# Patient Record
Sex: Female | Born: 1935 | Race: White | Hispanic: No | Marital: Married | Smoking: Never smoker
Health system: Southern US, Community
[De-identification: ages and names within clinical notes are randomized; demographics above are authoritative.]

## PROBLEM LIST (undated history)

## (undated) DIAGNOSIS — R55 Syncope and collapse: Secondary | ICD-10-CM

## (undated) DIAGNOSIS — I739 Peripheral vascular disease, unspecified: Secondary | ICD-10-CM

## (undated) DIAGNOSIS — C569 Malignant neoplasm of unspecified ovary: Secondary | ICD-10-CM

## (undated) DIAGNOSIS — R634 Abnormal weight loss: Secondary | ICD-10-CM

## (undated) DIAGNOSIS — R2681 Unsteadiness on feet: Secondary | ICD-10-CM

## (undated) DIAGNOSIS — R413 Other amnesia: Principal | ICD-10-CM

## (undated) DIAGNOSIS — N289 Disorder of kidney and ureter, unspecified: Secondary | ICD-10-CM

## (undated) DIAGNOSIS — F411 Generalized anxiety disorder: Secondary | ICD-10-CM

## (undated) DIAGNOSIS — S82891A Other fracture of right lower leg, initial encounter for closed fracture: Secondary | ICD-10-CM

## (undated) DIAGNOSIS — E785 Hyperlipidemia, unspecified: Secondary | ICD-10-CM

## (undated) DIAGNOSIS — I6529 Occlusion and stenosis of unspecified carotid artery: Secondary | ICD-10-CM

## (undated) DIAGNOSIS — I10 Essential (primary) hypertension: Secondary | ICD-10-CM

## (undated) DIAGNOSIS — R6889 Other general symptoms and signs: Secondary | ICD-10-CM

## (undated) DIAGNOSIS — F329 Major depressive disorder, single episode, unspecified: Secondary | ICD-10-CM

## (undated) DIAGNOSIS — I779 Disorder of arteries and arterioles, unspecified: Secondary | ICD-10-CM

## (undated) DIAGNOSIS — K5790 Diverticulosis of intestine, part unspecified, without perforation or abscess without bleeding: Secondary | ICD-10-CM

## (undated) DIAGNOSIS — G20A1 Parkinson's disease without dyskinesia, without mention of fluctuations: Secondary | ICD-10-CM

## (undated) DIAGNOSIS — G20 Parkinson's disease: Secondary | ICD-10-CM

## (undated) DIAGNOSIS — F0390 Unspecified dementia without behavioral disturbance: Secondary | ICD-10-CM

## (undated) DIAGNOSIS — E119 Type 2 diabetes mellitus without complications: Secondary | ICD-10-CM

## (undated) HISTORY — DX: Parkinson's disease: G20

## (undated) HISTORY — DX: Major depressive disorder, single episode, unspecified: F32.9

## (undated) HISTORY — PX: PROLAPSED UTERINE FIBROID LIGATION: SHX5400

## (undated) HISTORY — DX: Syncope and collapse: R55

## (undated) HISTORY — PX: APPENDECTOMY: SHX54

## (undated) HISTORY — DX: Other fracture of right lower leg, initial encounter for closed fracture: S82.891A

## (undated) HISTORY — DX: Occlusion and stenosis of unspecified carotid artery: I65.29

## (undated) HISTORY — DX: Essential (primary) hypertension: I10

## (undated) HISTORY — PX: CATARACT EXTRACTION: SUR2

## (undated) HISTORY — DX: Other general symptoms and signs: R68.89

## (undated) HISTORY — DX: Malignant neoplasm of unspecified ovary: C56.9

## (undated) HISTORY — PX: ABDOMINAL HYSTERECTOMY: SHX81

## (undated) HISTORY — DX: Diverticulosis of intestine, part unspecified, without perforation or abscess without bleeding: K57.90

## (undated) HISTORY — DX: Other amnesia: R41.3

## (undated) HISTORY — DX: Hyperlipidemia, unspecified: E78.5

## (undated) HISTORY — DX: Generalized anxiety disorder: F41.1

## (undated) HISTORY — DX: Abnormal weight loss: R63.4

## (undated) HISTORY — DX: Parkinson's disease without dyskinesia, without mention of fluctuations: G20.A1

## (undated) HISTORY — DX: Unsteadiness on feet: R26.81

## (undated) HISTORY — PX: ANKLE SURGERY: SHX546

## (undated) HISTORY — DX: Peripheral vascular disease, unspecified: I73.9

## (undated) HISTORY — DX: Disorder of arteries and arterioles, unspecified: I77.9

---

## 2003-07-24 ENCOUNTER — Encounter: Admission: RE | Admit: 2003-07-24 | Discharge: 2003-07-24 | Payer: Self-pay | Admitting: Internal Medicine

## 2003-07-24 ENCOUNTER — Encounter: Admission: RE | Admit: 2003-07-24 | Discharge: 2003-07-24 | Payer: Self-pay | Admitting: Obstetrics and Gynecology

## 2003-09-13 HISTORY — PX: VESICOVAGINAL FISTULA CLOSURE W/ TAH: SUR271

## 2003-10-10 ENCOUNTER — Inpatient Hospital Stay (HOSPITAL_COMMUNITY): Admission: RE | Admit: 2003-10-10 | Discharge: 2003-10-13 | Payer: Self-pay | Admitting: Obstetrics and Gynecology

## 2003-10-10 ENCOUNTER — Encounter (INDEPENDENT_AMBULATORY_CARE_PROVIDER_SITE_OTHER): Payer: Self-pay | Admitting: Specialist

## 2003-12-03 ENCOUNTER — Ambulatory Visit: Admission: RE | Admit: 2003-12-03 | Discharge: 2003-12-03 | Payer: Self-pay | Admitting: Gynecology

## 2003-12-18 ENCOUNTER — Ambulatory Visit (HOSPITAL_COMMUNITY): Admission: RE | Admit: 2003-12-18 | Discharge: 2003-12-18 | Payer: Self-pay | Admitting: Gynecology

## 2004-09-15 ENCOUNTER — Ambulatory Visit (HOSPITAL_COMMUNITY): Admission: RE | Admit: 2004-09-15 | Discharge: 2004-09-15 | Payer: Self-pay | Admitting: Obstetrics and Gynecology

## 2005-09-12 HISTORY — PX: OOPHORECTOMY: SHX86

## 2005-11-23 ENCOUNTER — Ambulatory Visit (HOSPITAL_COMMUNITY): Admission: RE | Admit: 2005-11-23 | Discharge: 2005-11-23 | Payer: Self-pay | Admitting: Obstetrics and Gynecology

## 2007-07-05 ENCOUNTER — Ambulatory Visit (HOSPITAL_COMMUNITY): Admission: RE | Admit: 2007-07-05 | Discharge: 2007-07-05 | Payer: Self-pay | Admitting: Obstetrics and Gynecology

## 2008-07-08 ENCOUNTER — Ambulatory Visit (HOSPITAL_COMMUNITY): Admission: RE | Admit: 2008-07-08 | Discharge: 2008-07-08 | Payer: Self-pay | Admitting: Obstetrics and Gynecology

## 2008-07-11 ENCOUNTER — Encounter: Admission: RE | Admit: 2008-07-11 | Discharge: 2008-07-11 | Payer: Self-pay | Admitting: Obstetrics and Gynecology

## 2009-07-13 ENCOUNTER — Ambulatory Visit (HOSPITAL_COMMUNITY): Admission: RE | Admit: 2009-07-13 | Discharge: 2009-07-13 | Payer: Self-pay | Admitting: Obstetrics and Gynecology

## 2009-12-14 ENCOUNTER — Ambulatory Visit (HOSPITAL_COMMUNITY): Admission: RE | Admit: 2009-12-14 | Discharge: 2009-12-14 | Payer: Self-pay | Admitting: Internal Medicine

## 2010-02-25 ENCOUNTER — Encounter: Admission: RE | Admit: 2010-02-25 | Discharge: 2010-02-25 | Payer: Self-pay | Admitting: Neurology

## 2010-07-14 ENCOUNTER — Ambulatory Visit (HOSPITAL_COMMUNITY): Admission: RE | Admit: 2010-07-14 | Discharge: 2010-07-14 | Payer: Self-pay | Admitting: Internal Medicine

## 2010-10-02 ENCOUNTER — Encounter: Payer: Self-pay | Admitting: Obstetrics and Gynecology

## 2010-10-03 ENCOUNTER — Encounter: Payer: Self-pay | Admitting: Obstetrics and Gynecology

## 2010-10-03 ENCOUNTER — Encounter: Payer: Self-pay | Admitting: Gynecology

## 2010-10-03 ENCOUNTER — Encounter: Payer: Self-pay | Admitting: Neurology

## 2011-01-28 NOTE — H&P (Signed)
NAME:  Alicia [name], Alicia [name]                     ACCOUNT NO.:  [id number]   MEDICAL RECORD NO.:  [id number]                   PATIENT TYPE:  INP   LOCATION:  NA                                   FACILITY:  [facility]   PHYSICIAN:  Katherine Roan, M.D.               DATE OF BIRTH:  [birth year]   DATE OF ADMISSION:  DATE OF DISCHARGE:                                HISTORY & PHYSICAL   CHIEF COMPLAINT:  Complete prolapse.   HISTORY OF PRESENT ILLNESS:  This patient is a 77 year old female with  complete uterine prolapse to the point that about 4 cm protrudes outside the  vagina. She has some pelvic pressure and desires this to be corrected.   Alicia medications include Lipitor 10 mg. She is on Diovan/HCTZ 160 mg and  folic acid. She currently takes Augmentin for a sinus infection that she was  treated for about a week ago. She states she feels good. She has a history  of broken ankle, history of D&C, and she has had one child. She has also had  a tonsillectomy.  He has been seen by the urologist and Alicia urethra appears  to have a low pressure urethra profile, so we plan to do a sling procedure  in addition to the pelvic repair.   REVIEW OF SYSTEMS:  HEENT: She wears glasses, but no headaches or dizziness.  CARDIAC: She has been followed for hypertension. Denies chest pain or  shortness of breath. This hypertension is very well controlled. LUNGS: No  chronic cough, no asthma. GU: She has minimal stress incontinence. No  history of UTIs. GI:  No bowel habit change. No melena. A recent colonoscopy  was normal. MUSCLE, BONES, & JOINTS: She fractured Alicia right ankle without  sequela.   FAMILY HISTORY:  Both parents are deceased. [name] died at 99 and Alicia father  died at 79. She has two sisters who are living and well. Alicia [name] did have  breast cancer.   PHYSICAL EXAMINATION:  GENERAL: Examination reveals a well-developed and  nourished female who appears to be Alicia stated age of 89. She is  oriented to  time, place, and recent events.  HEENT: Unremarkable. The oropharynx is not injected. There is clear nasal  discharge, but no postnasal drip. Ears are not infected. The oropharynx is  not injected.  NECK: Supple. Carotid pulses are equal without bruits. Trachea is in the  midline. No adenopathy appreciated.  BREASTS: No masses.  HEART: Normal sinus rhythm. No murmurs.  LUNGS: Clear to P&A.  ABDOMEN: Soft. Liver, spleen, or kidneys are not palpated. The liver is at  the right costal margin. No masses are felt and no tenderness.  EXTREMITIES: Trace edema. Equal pulses and reflexes.  PELVIC EXAM: Complex prolapse of the cervix. The uterus appears to be normal  size for Alicia age without masses felt in the adnexa. Rectovaginal confirms.  There is very little rectocele present.  IMPRESSION:  Complete prolapse.   PLAN:  Pelvic repair in conjunction with urologic surgery by Dr. Ihor Gully. I have discussed with Alicia [name] and Alicia [name] the risks of the  surgical procedure, particularly the risks of Alicia age, risks of hemorrhage,  infection, damage to bowel and bladder. Also urinary retention and failure  rates have been discussed with the patient.   FINAL DIAGNOSIS:  Complete prolapse for pelvic reconstruction.                                               Katherine Roan, M.D.    SDM/MEDQ  D:  10/07/2003  T:  10/07/2003  Job:  [id number]

## 2011-01-28 NOTE — Op Note (Signed)
NAME:  TALULLAH, ABATE                     ACCOUNT NO.:  [id number]   MEDICAL RECORD NO.:  [id number]                   PATIENT TYPE:  INP   LOCATION:  X001                                 FACILITY:  [facility]   PHYSICIAN:  Mark C. Vernie Ammons, M.D.               DATE OF BIRTH:  07/06/1923   DATE OF PROCEDURE:  DATE OF DISCHARGE:                                 OPERATIVE REPORT   PREOPERATIVE DIAGNOSIS:  Stress urinary incontinence.   POSTOPERATIVE DIAGNOSIS:  Stress urinary incontinence.   PROCEDURE:  Transobturator sling.   SURGEON:  Mark C. Vernie Ammons, M.D.   ANESTHESIA:  General.   DRAINS:  16 French Foley catheter.   ESTIMATED BLOOD LOSS:  Less than 5 mL.   SPECIMENS:  None.   COMPLICATIONS:  None.   INDICATIONS FOR PROCEDURE:  The patient is a 77 year old white female with  complete vaginal prolapse.  She has significant stress urinary incontinence  and was evaluated urodynamically and found to have a stable bladder but  diminished external urinary sphincter function.  I have discussed surgical  correction of this with her in conjunction with her planned procedure by Dr.  Elana Alm and she understands and would like to proceed with that.   DESCRIPTION OF PROCEDURE:  After informed consent, the patient was brought  to the major OR, placed on the table, administered general anesthesia and  then moved to the dorsal lithotomy position.  Her genitalia and vagina were  sterilely prepped and draped and Dr. Elana Alm performed her hysterectomy and  prepared the anterior vaginal wall for anterior repair.   With a 16 French Foley catheter in place, I was able to palpate the urethra  in the bladder neck region.  I therefore chose a location at the medial  aspect of the obturator fossa at the level of the clitoris and made separate  stab incisions on each side.  I made sure the bladder was empty and passed  the trocar through the skin and through the obturator fascia and obturator  foramen behind the symphysis pubis and out at the level of the mid urethra.  The tape was then placed in the obturator trocar and withdrawn to the skin  level. This was performed on first the left then right sides.  I then  removed the catheter and performed rigid cystoscopy and noted the bladder to  be fully intact with no tumor, stones or inflammatory lesions.  The  cystocele was noted.  No perforation or injury occurred from the passage of  the trocars.  I therefore replaced the Foley catheter and then adjusted the  tension on the sling so it was tension free but in good position in the mid  urethral region. I then cut off excess tape at the skin level and closed the  skin incisions with Dermabond.  Dr. Elana Alm then performed his anterior  repair and closed the anterior vaginal mucosa. His portion of the operation  will be dictated separately by him.  The patient tolerated this portion of  the  procedure well without any complications.                                               Mark C. Vernie Ammons, M.D.    MCO/MEDQ  D:  10/10/2003  T:  10/10/2003  Job:  [id number]   cc:   S. Kyra Manges, M.D.  [phone] [address]  [place]  [place] [address]  Fax: [phone]

## 2011-01-28 NOTE — Op Note (Signed)
NAME:  Alicia [name], [name]                     ACCOUNT NO.:  [id number]   MEDICAL RECORD NO.:  [id number]                   PATIENT TYPE:  INP   LOCATION:  X001                                 FACILITY:  [facility]   PHYSICIAN:  Katherine Roan, M.D.               DATE OF BIRTH:  [birth year]   DATE OF PROCEDURE:  10/10/2003  DATE OF DISCHARGE:                                 OPERATIVE REPORT   PREOPERATIVE DIAGNOSIS:  Complete genital prolapse.   POSTOPERATIVE DIAGNOSIS:  Complete genital prolapse.   OPERATION PERFORMED:  Pelvic examination under anesthesia, __________  enterocele repair and anterior repair, obturator sling by Veverly Fells. Vernie Ammons,  M.D.   The patient was positioned in the lithotomy position with Allen stirrups,  intubation and general endotracheal anesthesia having been administered by  Hezzie Bump. Okey Dupre, M.D.  The patient was examined, the uterus was completely  prolapsed and no pelvic masses were noted.   Prepped and draped in the usual fashion and the cervix grasped with a  tenaculum. The cul-de-sac was entered posteriorly as well as anteriorly and  I then delivered the uterus into the anterior incision and removed the  uterus from the uteroovarian anastomosis all the way down to the cardinal  ligaments and uterosacral ligaments.  We then did a wedge of the enterocele  sac and closed this with interrupted sutures of 3-0 Ethibond and #0 Vicryl.  At that point, we were getting ready to close the peritoneal cavity and we  could not find the sponge. We finally found it in the right quadrant in the  pelvis.  The pelvic peritoneum was then closed with a pursestring suture of  2-0 PDS and following this, we dissected the vagina from the underlying  pubocervical vaginal fascia.  Dr. Vernie Ammons came in and did a transobturator  incision along with cystoscopy.  The anterior repair was then completed by  plicating the pubocervical vaginal fascia in the midline and then the vagina  was  closed with interrupted sutures of 3-0 Vicryl and the continuation of  the 2-0 PDS with the peritoneal suture.  I decided not to do a posterior  repair.  The Foley was draining clear urine, she tolerated this procedure  well and was sent to the recovery room in good condition.                                               Katherine Roan, M.D.    SDM/MEDQ  D:  10/10/2003  T:  10/10/2003  Job:  [id number]   cc:   Veverly Fells. Vernie Ammons, M.D.  [address]  [place]  [place] [address]  Fax: [phone]

## 2011-01-28 NOTE — Discharge Summary (Signed)
NAME:  Alicia [name], Alicia [name]                     ACCOUNT NO.:  [id number]   MEDICAL RECORD NO.:  [id number]                   PATIENT TYPE:  INP   LOCATION:  0455                                 FACILITY:  [facility]   PHYSICIAN:  Katherine Roan, M.D.               DATE OF BIRTH:  11/13/1922   DATE OF ADMISSION:  10/10/2003  DATE OF DISCHARGE:  10/13/2003                                 DISCHARGE SUMMARY   ADMISSION DIAGNOSIS:  Complete genital prolapse with stress incontinence,  enterocele, and cystocele.   DISCHARGE DIAGNOSES:  1. Complete genital prolapse with stress incontinence, enterocele, and     cystocele.  2. Hypertension.  3. Endometrial carcinoma, grade 1.   BRIEF HISTORY:  [name] is a 77 year old female who has presented with  complete prolapse and pelvic discomfort, and desired this to be repaired.  Admission metabolic profile was normal. Hemoglobin was 12 and postoperative  hemoglobin was 11.2 with a white count of 11,000. Chest x-ray and cardiogram  were normal.   HOSPITAL COURSE:  The patient was admitted to the hospital and underwent a  pelvic examination under anesthesia. A vaginal hysterectomy, enterocele  repair, and anterior repair. At the time of the anterior repair a sling  procedure was done by Dr. Vernie Ammons. Her postoperative course was  uncomplicated. She was unable to void, so we discharged her on the 30th with  a Foley in place to see Dr. Vernie Ammons for catheter removal.   The final pathology report revealed an endometrial carcinoma that was grade  1 and limited to the lining of the uterus. She will be presented to oncology  at [facility] for consideration of further therapy. My opinion is probably  nothing else needs to be done.   CONDITION ON DISCHARGE:  Improved.                                               Katherine Roan, M.D.    SDM/MEDQ  D:  10/24/2003  T:  10/24/2003  Job:  [id number]   cc:   Loraine Leriche C. Vernie Ammons, M.D.  [address]  [place]  [place] [address]  Fax: [phone]

## 2011-01-28 NOTE — Consult Note (Signed)
NAME:  Alicia [name], Alicia [name]                     ACCOUNT NO.:  [id number]   MEDICAL RECORD NO.:  [id number]                   PATIENT TYPE:  OUT   LOCATION:  GYN                                  FACILITY:  [facility]   PHYSICIAN:  De Blanch, M.D.         DATE OF BIRTH:  Oct 30, 1922   DATE OF CONSULTATION:  12/03/2003  DATE OF DISCHARGE:                                   CONSULTATION   REASON FOR CONSULTATION:  A 77 year old white female seen in consultation at  the request of Dr. Kyra Manges.  The patient underwent vaginal hysterectomy  and colpocleisis for management of procidentia and pelvic floor prolapse.  Final pathology surprisingly showed an endometrioid adenocarcinoma grade 1  with myometrial invasion of 3 mm into a 5 mm thick myometrium (stage IC,  grade 1).  The patient has had an uncomplicated postoperative course and  presents for consultation regarding discussion of possible management  options.   PAST MEDICAL HISTORY:   MEDICAL ILLNESSES:  1. Hypertension.  2. Elevated cholesterol.   PAST SURGICAL HISTORY:  1. Ankle reconstruction.  2. Vaginal hysterectomy.  3. D&C.   OBSTETRICAL HISTORY:  Gravida 1.   DRUG ALLERGIES:  SULFA.   FAMILY HISTORY:  Negative for gynecologic or colon cancer.  The patient's  mother had breast cancer.   CURRENT MEDICATIONS:  Diovan, lisinopril, Lipitor.   REVIEW OF SYSTEMS:  Negative except as noted above.   PHYSICAL EXAMINATION:  VITAL SIGNS:  Weight 126 pounds.  The rest of the  exam is deferred at this time.   IMPRESSION:  Stage 1C, grade 1 endometrial cancer status post vaginal  hysterectomy.   I had a lengthy discussion with the patient and her daughter-in-law  regarding management options.  At this juncture, I have no evidence that  additional radiation therapy would improve her overall survival although it  might decrease local recurrences.  The risks and benefits of radiation  therapy were then weighed.   At the  end of the conversation, the patient and her daughter-in-law are in  agreement that they do not wish to have any additional radiation therapy.  We have agreed we will go ahead and obtain a baseline CT scan of the chest,  abdomen, and pelvis.   The pros and cons of removing the ovaries were also discussed.  I think the  risk of ovarian involvement is small and the risks of laparoscopically  removing the ovaries are not inconsequential.  We will contact the patient  once the CT scan report is back and we will suggest that she follow up with  Dr. Elana Alm in the future.                                               De Blanch, M.D.    DC/MEDQ  D:  12/03/2003  T:  12/04/2003  Job:  [id number]   cc:   S. Kyra Manges, M.D.  [phone] [address]  [place]  [place] [address]  Fax: [phone]   Telford Nab, R.N.  [address]  [place], [place] [address]

## 2011-06-16 ENCOUNTER — Other Ambulatory Visit (HOSPITAL_COMMUNITY): Payer: Self-pay | Admitting: Physician Assistant

## 2011-06-16 DIAGNOSIS — M810 Age-related osteoporosis without current pathological fracture: Secondary | ICD-10-CM

## 2011-06-16 DIAGNOSIS — Z1231 Encounter for screening mammogram for malignant neoplasm of breast: Secondary | ICD-10-CM

## 2011-07-20 ENCOUNTER — Other Ambulatory Visit (HOSPITAL_COMMUNITY): Payer: Self-pay

## 2011-07-20 ENCOUNTER — Ambulatory Visit (HOSPITAL_COMMUNITY): Payer: Self-pay

## 2011-08-17 ENCOUNTER — Ambulatory Visit (HOSPITAL_COMMUNITY)
Admission: RE | Admit: 2011-08-17 | Discharge: 2011-08-17 | Disposition: A | Discharge status: Home / Self Care | Payer: Medicare Other | Source: Ambulatory Visit | Attending: Physician Assistant | Admitting: Physician Assistant

## 2011-08-17 DIAGNOSIS — Z1231 Encounter for screening mammogram for malignant neoplasm of breast: Secondary | ICD-10-CM | POA: Insufficient documentation

## 2011-08-22 ENCOUNTER — Other Ambulatory Visit: Payer: Self-pay | Admitting: Physician Assistant

## 2011-08-22 DIAGNOSIS — R928 Other abnormal and inconclusive findings on diagnostic imaging of breast: Secondary | ICD-10-CM

## 2011-09-01 ENCOUNTER — Ambulatory Visit
Admission: RE | Admit: 2011-09-01 | Discharge: 2011-09-01 | Disposition: A | Discharge status: Home / Self Care | Payer: Medicare Other | Source: Ambulatory Visit | Attending: Physician Assistant | Admitting: Physician Assistant

## 2011-09-01 DIAGNOSIS — R928 Other abnormal and inconclusive findings on diagnostic imaging of breast: Secondary | ICD-10-CM

## 2012-02-14 ENCOUNTER — Other Ambulatory Visit (HOSPITAL_COMMUNITY): Payer: Self-pay | Admitting: Physician Assistant

## 2012-02-14 DIAGNOSIS — M810 Age-related osteoporosis without current pathological fracture: Secondary | ICD-10-CM

## 2012-02-23 ENCOUNTER — Ambulatory Visit (HOSPITAL_COMMUNITY)
Admission: RE | Admit: 2012-02-23 | Discharge: 2012-02-23 | Disposition: A | Discharge status: Home / Self Care | Payer: Medicare Other | Source: Ambulatory Visit | Attending: Physician Assistant | Admitting: Physician Assistant

## 2012-02-27 ENCOUNTER — Other Ambulatory Visit (HOSPITAL_COMMUNITY): Payer: Self-pay | Admitting: Physician Assistant

## 2012-02-27 DIAGNOSIS — Z1231 Encounter for screening mammogram for malignant neoplasm of breast: Secondary | ICD-10-CM

## 2012-05-20 ENCOUNTER — Encounter (HOSPITAL_COMMUNITY): Payer: Self-pay

## 2012-05-20 ENCOUNTER — Emergency Department (HOSPITAL_COMMUNITY)
Admission: EM | Admit: 2012-05-20 | Discharge: 2012-05-20 | Disposition: A | Discharge status: Home / Self Care | Payer: Medicare Other | Attending: Emergency Medicine | Admitting: Emergency Medicine

## 2012-05-20 ENCOUNTER — Emergency Department (HOSPITAL_COMMUNITY): Payer: Medicare Other

## 2012-05-20 DIAGNOSIS — M542 Cervicalgia: Secondary | ICD-10-CM | POA: Insufficient documentation

## 2012-05-20 DIAGNOSIS — S0181XA Laceration without foreign body of other part of head, initial encounter: Secondary | ICD-10-CM

## 2012-05-20 DIAGNOSIS — W19XXXA Unspecified fall, initial encounter: Secondary | ICD-10-CM

## 2012-05-20 DIAGNOSIS — W101XXA Fall (on)(from) sidewalk curb, initial encounter: Secondary | ICD-10-CM | POA: Insufficient documentation

## 2012-05-20 DIAGNOSIS — I10 Essential (primary) hypertension: Secondary | ICD-10-CM | POA: Insufficient documentation

## 2012-05-20 DIAGNOSIS — T07XXXA Unspecified multiple injuries, initial encounter: Secondary | ICD-10-CM

## 2012-05-20 DIAGNOSIS — R51 Headache: Secondary | ICD-10-CM | POA: Insufficient documentation

## 2012-05-20 DIAGNOSIS — IMO0002 Reserved for concepts with insufficient information to code with codable children: Secondary | ICD-10-CM | POA: Insufficient documentation

## 2012-05-20 DIAGNOSIS — S0180XA Unspecified open wound of other part of head, initial encounter: Secondary | ICD-10-CM | POA: Insufficient documentation

## 2012-05-20 HISTORY — DX: Unspecified dementia, unspecified severity, without behavioral disturbance, psychotic disturbance, mood disturbance, and anxiety: F03.90

## 2012-05-20 HISTORY — DX: Disorder of kidney and ureter, unspecified: N28.9

## 2012-05-20 NOTE — ED Notes (Signed)
Pt fell in parking lot today, head hit pavement, with lac to forehead and abrasion on knees.  No Loss of consciousness

## 2012-05-20 NOTE — ED Provider Notes (Signed)
Medical screening examination/treatment/procedure(s) were conducted as a shared visit with non-physician practitioner(s) and myself.  I personally evaluated the patient during the encounter  Left forehead lac, denies focal neuro Sxs, superficial abrasions to knees, tetanus shot UTD  Hurman Horn, MD 05/24/12 1656

## 2012-05-20 NOTE — ED Notes (Signed)
Daughter in law at bedside, pt lives with her.

## 2012-05-20 NOTE — ED Notes (Signed)
Daughter in law Reginae Wolfrey [phone].

## 2012-05-20 NOTE — ED Provider Notes (Signed)
History     CSN: [id number]  Arrival date & time 05/20/12  1350   First MD Initiated Contact with Patient 05/20/12 1436      Chief Complaint  Patient presents with  . Fall    (Consider location/radiation/quality/duration/timing/severity/associated sxs/prior treatment) Patient is a 78 y.o. female presenting with fall. The history is provided by the patient.  Fall The accident occurred 1 to 2 hours ago. Pertinent negatives include no abdominal pain and no nausea. Associated symptoms comments: She lost her balance when stepping off a curb, throwing her forward into a car. She has a laceration over left eye at point of impact. She also fell onto her knees and complains of bilateral knee abrasions. No LOC, no nausea or vomiting. Per family member at bedside, no change in mentation. She denies chest pain, abdominal pain or neck pain..    Past Medical History  Diagnosis Date  . Dementia   . Renal disease   . Hypertension     History reviewed. No pertinent past surgical history.  History reviewed. No pertinent family history.  History  Substance Use Topics  . Smoking status: Never Smoker   . Smokeless tobacco: Not on file  . Alcohol Use: No    OB History    Grav Para Term Preterm Abortions TAB SAB Ect Mult Living                  Review of Systems  HENT: Negative for neck pain.   Eyes: Negative for visual disturbance.  Respiratory: Negative for shortness of breath.   Cardiovascular: Negative for chest pain.  Gastrointestinal: Negative for nausea and abdominal pain.  Genitourinary: Negative for pelvic pain.  Skin: Positive for wound.  Neurological: Negative for dizziness.    Allergies  Sulfa antibiotics  Home Medications   Current Outpatient Rx  Name Route Sig Dispense Refill  . AMLODIPINE BESYLATE 10 MG PO TABS Oral Take 10 mg by mouth daily.    . ASPIRIN EC 81 MG PO TBEC Oral Take 81 mg by mouth daily.    . ATORVASTATIN CALCIUM 20 MG PO TABS Oral Take 20 mg by  mouth daily.    . DONEPEZIL HCL 5 MG PO TABS Oral Take 5 mg by mouth at bedtime.    Marland Kitchen LISINOPRIL 20 MG PO TABS Oral Take 20 mg by mouth 2 (two) times daily.    Marland Kitchen METOPROLOL SUCCINATE ER 50 MG PO TB24 Oral Take 50 mg by mouth daily. Take with or immediately following a meal.    . ADULT MULTIVITAMIN W/MINERALS CH Oral Take 1 tablet by mouth daily.    . OXYBUTYNIN CHLORIDE ER 10 MG PO TB24 Oral Take 10 mg by mouth daily.    . SERTRALINE HCL 50 MG PO TABS Oral Take 50 mg by mouth daily.      BP 114/48  Pulse 58  Temp 98.4 F (36.9 C) (Oral)  SpO2 100%  Physical Exam  Constitutional: She is oriented to person, place, and time. She appears well-developed and well-nourished. No distress.  HENT:  Head: Normocephalic.       Linear, vertical laceration left forehead. Minimal swelling.   Eyes: Conjunctivae are normal. Pupils are equal, round, and reactive to light.  Neck: Normal range of motion.  Cardiovascular: Normal rate and regular rhythm.   No murmur heard. Pulmonary/Chest: Effort normal. She has no wheezes. She has no rales. She exhibits no tenderness.  Abdominal: Soft. There is no tenderness. There is no rebound and no  guarding.  Musculoskeletal: Normal range of motion. She exhibits no edema.       Knees bilaterally have anterior abrasions without significant swelling. FROM without discomfort. No midline cervical, lumbar or thoracic tenderness.   Neurological: She is alert and oriented to person, place, and time. She displays normal reflexes. Coordination normal.  Skin: Skin is warm and dry.  Psychiatric: She has a normal mood and affect.    ED Course  Procedures (including critical care time)  Labs Reviewed - No data to display No results found. LACERATION REPAIR Performed by: Langley Adie A Authorized by: Langley Adie A Consent: Verbal consent obtained. Risks and benefits: risks, benefits and alternatives were discussed Consent given by: patient Patient identity  confirmed: provided demographic data Prepped and Draped in normal sterile fashion Wound explored  Laceration Location: left forehead  Laceration Length: 2cm  No Foreign Bodies seen or palpated  Anesthesia: local infiltration  Local anesthetic: lidocaine 1% w/o epinephrine  Anesthetic total: 1 ml  Irrigation method: syringe Amount of cleaning: standard  Skin closure: 6-0 prolene  Number of sutures: 4  Technique: simple interrupted  Patient tolerance: Patient tolerated the procedure well with no immediate complications.   No diagnosis found.  1. Fall 2. Forehead laceration 3. Multiple abrasions.  MDM  Patient remains alert and coherent. Laceration repaired, then patient ambulated and was found to be without ataxia, dizziness or pain. Fall was mechanical fall, and she appears stable for discharge.        Rodena Medin, PA-C 05/20/12 1636

## 2012-05-20 NOTE — ED Notes (Signed)
Pts daughter in law, Fannie Knee, called.  She can not make it to the hospital until 1830 to pick up patient.

## 2012-05-24 NOTE — ED Provider Notes (Signed)
Medical screening examination/treatment/procedure(s) were conducted as a shared visit with non-physician practitioner(s) and myself.  I personally evaluated the patient during the encounter  Hurman Horn, MD 05/24/12 [phone]

## 2012-08-20 ENCOUNTER — Ambulatory Visit (HOSPITAL_COMMUNITY)
Admission: RE | Admit: 2012-08-20 | Discharge: 2012-08-20 | Disposition: A | Discharge status: Home / Self Care | Payer: Medicare Other | Source: Ambulatory Visit | Attending: Physician Assistant | Admitting: Physician Assistant

## 2012-08-20 DIAGNOSIS — Z780 Asymptomatic menopausal state: Secondary | ICD-10-CM | POA: Insufficient documentation

## 2012-08-20 DIAGNOSIS — Z1231 Encounter for screening mammogram for malignant neoplasm of breast: Secondary | ICD-10-CM

## 2012-08-20 DIAGNOSIS — M810 Age-related osteoporosis without current pathological fracture: Secondary | ICD-10-CM

## 2012-08-20 DIAGNOSIS — Z13820 Encounter for screening for osteoporosis: Secondary | ICD-10-CM | POA: Insufficient documentation

## 2012-12-10 ENCOUNTER — Ambulatory Visit (INDEPENDENT_AMBULATORY_CARE_PROVIDER_SITE_OTHER): Payer: Medicare Other | Admitting: Neurology

## 2012-12-10 ENCOUNTER — Encounter: Payer: Self-pay | Admitting: Neurology

## 2012-12-10 VITALS — BP 108/59 | HR 57 | Temp 96.9°F | Ht 61.5 in | Wt 119.0 lb

## 2012-12-10 DIAGNOSIS — F32A Depression, unspecified: Secondary | ICD-10-CM

## 2012-12-10 DIAGNOSIS — F411 Generalized anxiety disorder: Secondary | ICD-10-CM

## 2012-12-10 DIAGNOSIS — R634 Abnormal weight loss: Secondary | ICD-10-CM

## 2012-12-10 DIAGNOSIS — I10 Essential (primary) hypertension: Secondary | ICD-10-CM

## 2012-12-10 DIAGNOSIS — E785 Hyperlipidemia, unspecified: Secondary | ICD-10-CM | POA: Insufficient documentation

## 2012-12-10 DIAGNOSIS — R413 Other amnesia: Secondary | ICD-10-CM

## 2012-12-10 DIAGNOSIS — IMO0001 Reserved for inherently not codable concepts without codable children: Secondary | ICD-10-CM

## 2012-12-10 DIAGNOSIS — R6889 Other general symptoms and signs: Secondary | ICD-10-CM

## 2012-12-10 DIAGNOSIS — F329 Major depressive disorder, single episode, unspecified: Secondary | ICD-10-CM | POA: Insufficient documentation

## 2012-12-10 DIAGNOSIS — R55 Syncope and collapse: Secondary | ICD-10-CM | POA: Insufficient documentation

## 2012-12-10 DIAGNOSIS — R569 Unspecified convulsions: Secondary | ICD-10-CM

## 2012-12-10 HISTORY — DX: Reserved for inherently not codable concepts without codable children: IMO0001

## 2012-12-10 HISTORY — DX: Essential (primary) hypertension: I10

## 2012-12-10 HISTORY — DX: Hyperlipidemia, unspecified: E78.5

## 2012-12-10 HISTORY — DX: Generalized anxiety disorder: F41.1

## 2012-12-10 HISTORY — DX: Other amnesia: R41.3

## 2012-12-10 HISTORY — DX: Abnormal weight loss: R63.4

## 2012-12-10 HISTORY — DX: Depression, unspecified: F32.A

## 2012-12-10 HISTORY — DX: Syncope and collapse: R55

## 2012-12-10 MED ORDER — DONEPEZIL HCL 5 MG PO TABS: 5.0000 mg | ORAL_TABLET | Freq: Every day | ORAL | Status: DC

## 2012-12-10 NOTE — Progress Notes (Signed)
Subjective:    Patient ID: Alicia [name] is a 79 y.o. female.  HPI Interim history:  Alicia [name] is a pleasant 79 year old right-handed woman who presents for followup consultation of her memory loss and periods of confusion. She is accompanied by her daughter today and this is her first visit with me. She is a former patient of Dr. Imagene Gurney and was followed by him for several years. She has an underlying history of hypo-pressure, cancer, depression and anxiety, hyperlipidemia, syncope and weight loss. She was last seen by him recently on 10/12/2012 at which time he did not increase the donezepil because of her weight loss. She was asked to go back to see her primary care physician for evaluation of her weight loss. I reviewed Dr. Imagene Gurney prior notes and the patient records and below is a summary of that review:  79 year old right-handed white widowed female, who has been living with her son and daughter-in-law for the past 10+ years. The last 6 years she has had right hand and arm tremor greater than left hand and arm tremor which is getting worse. It affects her eating, holding a glass of fluid, and handwriting but only to a minor extent. She has no family history of tremor. She does not drink alcohol. [name] noted a change in her memory. She made mistakes in bill paying, told  the same story over and over, had difficulty preparing meals and could not remember if she had fed the grandchildren. She got lost driving to a swimming pool with the grandchildren 4-1/2 years ago. They had to instruct her how to get back home. She no longer drives a car. She has no family history of memory loss. She denies headaches, single eye visual loss, double vision, or swallowing problems. On 02/11/2010 MMSE 27/30, CDT 4/4,  and AFT 8 . She does not shop,cook, or clean, or excercise. She has bladder urgency and  incontinence. MRI of the brain from 02/25/2010 showed chronic microvascular ischemia and  generalized atrophy. RPR and B12 were normal on 02/14/2010. She was started on Aricept 5 mg daily but discontinued the medication and also discontinued Zoloft. [name] restarted donepezil 5 mg. She had a pre[place]syncopal spell [place] and when  EMT service arrived her blood pressure was 60/40 and she was taken to [facility] emergency room. She has a prior history many years ago of a similar event. The patient had no associated urinary or bowel incontinence, clonic activity, or chest pain.  She had no post episode confusion. There were no focal neurologic symptoms or signs except drawing  up her right leg. On 07/27/2011 = MMSE 25/30, CDT 4/4, AFT 8. GDS 0/15, Falls assessment tool scores 16. Donezepil was kept at 5 mg daily, due to concern that she may have bradycardia. [name] indicates that the patient had carotid Dopplers that were normal 2012. The patient fell in 2013 and struck her head. She was losing weight. On 10/12/2012 = MMSE 22/30, CDT 4/4, AFT 9, GDS 1/15. Falls assessment tool score 19.   She had recent C. Doppler studies. She was admitted to [facility] on 3/18 and was kept 2 nights. She was given IVF and had a CXR and a CTH and brain MRI. She had some labs done and an EKG. She had no abnormal tests, as far as Alicia [name] knows. I do not have any records available as yet. She denies any recent chest pain, shortness of breath, and there is  no report of twitching or jerking episodes. When she was admitted to [facility] she had an episode of unresponsiveness with eyes rolled back. No tongue bite and no urinary incontinence was reported by daughter-in-law.  Her Past Medical History Is Significant For: Past Medical History  Diagnosis Date  . Dementia   . Renal disease   . Hypertension   . Memory loss 12/10/2012  . Depression 12/10/2012  . Anxiety state, unspecified 12/10/2012  . Syncope 12/10/2012  . Spells 12/10/2012  . Essential  hypertension, benign 12/10/2012  . Other and unspecified hyperlipidemia 12/10/2012  . Loss of weight 12/10/2012    Her Past Surgical History Is Significant For: History reviewed. No pertinent past surgical history.  Her Family History Is Significant For: No family history on file.  Her Social History Is Significant For: History   Social History  . Marital Status: Married    Spouse Name: N/A    Number of Children: N/A  . Years of Education: N/A   Social History Main Topics  . Smoking status: Never Smoker   . Smokeless tobacco: None  . Alcohol Use: No  . Drug Use: No  . Sexually Active: No   Other Topics Concern  . None   Social History Narrative  . None    Her Allergies Are:  Allergies  Allergen Reactions  . Sulfa Antibiotics Hives and Itching  :   Her Current Medications Are:  Outpatient Encounter Prescriptions as of 12/10/2012  Medication Sig Dispense Refill  . amLODipine (NORVASC) 10 MG tablet Take 10 mg by mouth daily.      Marland Kitchen aspirin EC 81 MG tablet Take 81 mg by mouth daily.      Marland Kitchen atorvastatin (LIPITOR) 20 MG tablet Take 20 mg by mouth daily.      . calcium carbonate 200 MG capsule Take 600 mg by mouth daily. Take at night      . donepezil (ARICEPT) 5 MG tablet Take 1 tablet (5 mg total) by mouth at bedtime.  90 tablet  3  . lisinopril (PRINIVIL,ZESTRIL) 20 MG tablet Take 20 mg by mouth 2 (two) times daily.      . metoprolol succinate (TOPROL-XL) 50 MG 24 hr tablet Take 50 mg by mouth daily. Take with or immediately following a meal.      . Multiple Vitamin (MULTIVITAMIN WITH MINERALS) TABS Take 1 tablet by mouth daily.      Marland Kitchen oxybutynin (DITROPAN-XL) 10 MG 24 hr tablet Take 10 mg by mouth daily.      . sertraline (ZOLOFT) 50 MG tablet Take 50 mg by mouth daily.      . [DISCONTINUED] donepezil (ARICEPT) 5 MG tablet Take 5 mg by mouth at bedtime.       No facility-administered encounter medications on file as of 12/10/2012.  :  Review of Systems   Constitutional: Positive for fatigue and unexpected weight change (weight loss).  HENT: Positive for hearing loss.   Gastrointestinal:       Incontinence  Genitourinary:       Incontinence  Neurological: Positive for tremors.       Memory loss    Objective:  Neurologic Exam  Physical Exam  Physical Examination:   Filed Vitals:   12/10/12 1134  BP: 108/59  Pulse: 57  Temp: 96.9 F (36.1 C)   General Examination: The patient is a very pleasant 79 y.o. female in no acute distress. She is calm and cooperative with the exam. She denies Visual  Hallucinations or auditory hallucinations.   HEENT: Normocephalic, atraumatic, pupils are equal, round and reactive to light and accommodation. Extraocular tracking shows moderate saccadic breakdown without nystagmus noted. Hearing is impaired. Face is symmetric with mild facial masking and normal facial sensation. There is a mild intermittent lip, and lower jaw tremor. Neck is supple rigid with intact passive ROM. There are no carotid bruits on auscultation. Oropharynx exam reveals no mouth dryness. No significant airway crowding is noted. Mallampati is class II. Tongue protrudes centrally and palate elevates symmetrically.    Chest: is clear to auscultation without wheezing, rhonchi or crackles noted.  Heart: sounds are regular and normal without murmurs, rubs or gallops noted.   Abdomen: is soft, non-tender and non-distended with normal bowel sounds appreciated on auscultation.  Extremities: There is no pitting edema in the distal lower extremities bilaterally. Pedal pulses are intact.  Skin: is warm and dry with no trophic changes noted.  Musculoskeletal: exam reveals no obvious joint deformities, tenderness or joint swelling or erythema.  Neurologically:  Mental status: The patient is awake and alert, paying fair  attention. She is unable to provide much in the way of history. She is oriented to: person, place, time/date, day of week,  month of year and year. Her memory, attention, language and knowledge are impaired. There is no aphasia, agnosia, apraxia or anomia. There is a mild degree of bradyphrenia. Speech is mildly hypophonic with no dysarthria noted. Mood is congruent and affect is normal.    Cranial nerves are as described above under HEENT exam. In addition, shoulder shrug is normal with equal shoulder height noted.  Motor exam: Normal bulk, and strength for age is noted. Tone is not rigid with absence of cogwheeling in the extremities. There is overall mild bradykinesia. There is no drift or rebound. Romberg is negative. Reflexes are 2+ in the upper extremities and 2+ in the lower extremities. Fine motor skills: Finger taps, hand movements, and rapid alternating patting are mildly impaired bilaterally. Foot taps and foot agility are mildly impaired bilaterally.   Cerebellar testing shows no dysmetria or intention tremor on finger to nose testing. There is no truncal or gait ataxia.   Sensory exam is intact to light touch, pinprick, vibration, temperature sense.   Gait, station and balance: She stands up from the seated position with mild difficulty and needs to push herself up. No veering to one side is noted. No leaning to one side. Posture is mildly stooped. Stance is wide-based. She turns in 3 steps. Tandem walk is not possible. Balance is mildly impaired.    Assessment and Plan:    Assessment and Plan:  In summary, Alicia [name] is a very pleasant 79 y.o.-year old female with a history of memory loss and what sounds like syncopal and pre-syncopal spells. She is doing fairly well at this time, But she did have a recent admission to [facility]. I'm awaiting records from them.  I had a long chat with the patient and her daughter about my findings and the diagnosis, its prognosis and treatment options. We talked about medical treatments and non-pharmacological approaches. We talked about  maintaining a healthy lifestyle in general. I encouraged the patient to eat healthy, exercise daily and keep well hydrated, to keep a scheduled bedtime and wake time routine, to not skip any meals and eat healthy snacks in between meals and to have protein with every meal.   I recommended the following at this time: I would like to  go ahead and get a cardiology evaluation as well as an EEG at this time. I did not make any changes to her medications and she did not require any refills today. I answered all their questions and the patient and her daughter-in-law are in agreement with the plan. I would like to see her back in 3 months, sooner if the need arises and encouraged them to call with any interim questions, concerns, problems or updates and refill requests.

## 2012-12-10 NOTE — Patient Instructions (Addendum)
As you I think overall you are doing fairly well but I do have some suggestions for you today:  Please make sure that you drink plenty of fluids. I would like for you to exercise daily for example in the form of walking 10-20 minutes a day, if you can. Please keep a regular sleep-wake schedule, keep regular meal times, do not skip any meals, eat  healthy snacks in between meals, such as fruit or nuts. Try to eat protein with every meal.   Engage in social activities in your community and with your family and try to keep up with current events by reading the newspaper or watching the news.  I do not think we need to make any changes in your medications at this point, but I do think we should get a cardiac eval done and get an EEG (brain wave test). I will see you back in 3 months, sooner if we need to. Please call us if you have any interim questions, concerns, or problems or updates to need to discuss.  Please also call us for any test results so we can go over those with you on the phone. Brett Canales is my clinical assistant and will answer any of your questions and relay your messages to me and will give you my messages.   Our phone number is [phone]. We also have an after hours call service for urgent matters and there is a physician on-call for urgent questions. For any emergencies you know to call 911 or go to the nearest emergency room.

## 2012-12-13 ENCOUNTER — Ambulatory Visit (INDEPENDENT_AMBULATORY_CARE_PROVIDER_SITE_OTHER): Payer: Medicare Other

## 2012-12-13 DIAGNOSIS — R6889 Other general symptoms and signs: Secondary | ICD-10-CM

## 2012-12-13 DIAGNOSIS — R413 Other amnesia: Secondary | ICD-10-CM

## 2012-12-13 DIAGNOSIS — R55 Syncope and collapse: Secondary | ICD-10-CM

## 2012-12-13 NOTE — Procedures (Signed)
   [facility]  EEG (ELECTROENCEPHALOGRAM) REPORT   STUDY DATE: 12/13/12 PATIENT NAME: Alicia [name] DOB: 01-31-1923 MRN: [id number]  ORDERING CLINICIAN: Huston Foley, MD PhD   TECHNOLOGIST: Kaylyn Lim TECHNIQUE: Electroencephalogram was recorded utilizing standard 10-20 system of lead placement and reformatted into average and bipolar montages.  RECORDING TIME: 21 minutes ACTIVATION: photic stimulation  CLINICAL INFORMATION: 79 year old female with memory loss and syncope.  FINDINGS: Background rhythms of 9-10 hertz and 30-40 microvolts. No focal, lateralizing, epileptiform activity or seizures are seen. Patient recorded in the awake state.  IMPRESSION:  Normal EEG in awake state.   INTERPRETING PHYSICIAN:  Suanne Marker, MD Certified in Neurology, Neurophysiology and Neuroimaging  [facility] [address], [address] [place], [place] [address] [phone]

## 2012-12-14 NOTE — Progress Notes (Signed)
Quick Note:  Please call and advise the patient that the EEG or brain wake test we checked was reported as normal in the awake state. Please remind patient to keep any upcoming appointments and call with any interim questions, concerns, problems or updates. Thanks,  Aliha Diedrich, MD, PhD    ______ 

## 2012-12-21 NOTE — Progress Notes (Signed)
Quick Note:  Spoke with patient's daughter and relayed results of EEG. She understood and had no questions. ______

## 2013-01-24 ENCOUNTER — Telehealth: Payer: Self-pay | Admitting: Neurology

## 2013-01-28 ENCOUNTER — Encounter: Payer: Self-pay | Admitting: Cardiology

## 2013-01-28 NOTE — Telephone Encounter (Signed)
I spoke with daughter-in-law and she had already gotten the patient in to see Dr. Viann Fish.

## 2013-01-31 ENCOUNTER — Telehealth: Payer: Self-pay | Admitting: Neurology

## 2013-02-14 DIAGNOSIS — Z0289 Encounter for other administrative examinations: Secondary | ICD-10-CM

## 2013-02-27 ENCOUNTER — Telehealth: Payer: Self-pay

## 2013-02-27 NOTE — Telephone Encounter (Signed)
Legal guardian calling to ask to have other provider added to list of persons who can provide care to patient. They will drop off forms tomorrow and forms will be signed by Dr. Frances Furbish next week and faxed, per guardian request.

## 2013-02-27 NOTE — Telephone Encounter (Signed)
Message copied by [facility] on Wed Feb 27, 2013  3:03 PM ------      Message from: Seth Bake      Created: Wed Feb 27, 2013  1:28 PM       Genevie Cheshire checking on a long term health care insurance form, [employer].  Please call [phone] ------

## 2013-03-05 ENCOUNTER — Encounter: Payer: Self-pay | Admitting: Cardiology

## 2013-03-05 ENCOUNTER — Telehealth: Payer: Self-pay

## 2013-03-05 NOTE — Progress Notes (Signed)
Patient ID: Alicia [name], female   DOB: [birth year], 78 y.o.   MRN: [id number]   Alicia [name]    Date of visit:  01/28/2013 DOB:  Jan 23, 1923    Age:  89 yrs. Medical record number:  [id number]     Account number:  [id number] Primary Care Provider: Synetta Fail ____________________________ CURRENT DIAGNOSES  1. Syncope  2. Hyperlipidemia  3. Hypertension,Essential (Benign)  4. Carotid Artery Stenosis ____________________________ ALLERGIES  Sulfonamides ____________________________ MEDICATIONS  1. lisinopril 20 mg tablet, BID  2. donepezil 5 mg tablet, QHS  3. oxybutynin chloride 10 mg tablet extended release 24hr, 1 p.o. daily  4. aspirin 81 mg tablet,chewable, 1 p.o. daily  5. sertraline 50 mg tablet, 1 p.o. daily  6. multivitamin tablet, 1 p.o. daily  7. Calcium 500 + D 500 mg(1,250mg ) -400 unit tablet,chewable, 1 p.o. daily  8. atorvastatin 20 mg tablet, 1 p.o. daily  9. metoprolol succinate 50 mg tablet extended release 24 hr, 1/2 tab daily ____________________________ CHIEF COMPLAINTS   Syncope ____________________________ HISTORY OF PRESENT ILLNESS  This very nice 79 year old female is seen at the request of Dr. Derrell Lolling for evaluation of syncope. The patient has a prior history of dementia and has been living with her son for over 10 years. She was formerly seen by Dr. Sandria Manly and is currently being seen at [facility]. She has had at least 3 episodes of syncope over the years. One occurred at  an outdoor festival and was associated with hypotension. She had a recent episode where she was sitting on the commode and she may have been unresponsive for about one minute. She was taken to [facility] and was kept there for a couple of days. Evidently she was given intravenous fluids and had a number of other tests that She was taken off of amlodipine and saw the neurologist who asked  her to have a cardiology consultation. She was confused about the month but did know the  year and the day of the week. She has some mild dyspnea but is relatively sedentary. Her son is concerned because she has been sitting around for some time. He brings in records of carotid Doppler studies showing a 60-79% stenosis on the right and mild disease on the left. She has had some mild bradycardia and has been on Aricept in the past. She is fairly sedentary. She has no history of angina and denies PND, orthopnea or claudication. ____________________________ PAST HISTORY  Past Medical Illnesses:  hypertension, hyperlipidemia, ovarian cancer treated with surgery and chemo, dementia;  Cardiovascular Illnesses:  Carotid artery disease;  Surgical Procedures:  appendectomy, cataract extraction OU, hysterectomy-total, oophorectomy, colon blockage, fx rt ankle;  Cardiology Procedures-Invasive:  no history of prior cardiac procedures;  Cardiology Procedures-Noninvasive:  no previous non-invasive procedures;  LVEF not documented,   ____________________________ CARDIO-PULMONARY TEST DATES EKG Date:  01/28/2013;   ____________________________ FAMILY HISTORY Father -  deceased; Mother - died of breast cancer; Sister 1 -  alive and well; Sister 2 -  alive and well and valve replacement;  ____________________________ SOCIAL HISTORY Alcohol Use:  no alcohol use;  Smoking:  never smoked;  Diet:  regular diet;  Lifestyle:  widowed and  lives with son;  Exercise:  no regular exercise;  Occupation:  retired Teacher, early years/pre;   ____________________________ REVIEW OF SYSTEMS General:   Abnormal weight loss  Integumentary:no rashes or new skin lesions. Eyes: wears eye glasses/contact lenses, cataract extraction bilaterally Ears, Nose, Throat, Mouth:  denies any hearing loss, epistaxis,  hoarseness or difficulty speaking. Respiratory: denies dyspnea, cough, wheezing or hemoptysis. Cardiovascular:  please review HPI Abdominal: denies dyspepsia, GI bleeding, constipation, or diarrheaGenitourinary-Female: no dysuria,  urgency, frequency, UTIs, or stress incontinence Musculoskeletal:  denies arthritis, venous insufficiency, or muscle weakness. Neurological:  denies headaches, stroke, or TIA,. Psychiatric:  denies depession or anxiety. Hematological/Immunologic:  denies any food allergies, bleeding disorders. ____________________________ PHYSICAL EXAMINATION VITAL SIGNS  Blood Pressure:  138/64 Sitting, Right arm, regular cuff  , 152/54 Standing, Right arm and regular cuff   Pulse:  54/min. Weight:  119.50 lbs. Height:  62"BMI: 22  Constitutional:  pleasant white female, in no acute distress Skin:  warm and dry to touch, no apparent skin lesions, or masses noted. Head:  normocephalic, normal hair pattern, no masses or tenderness Eyes:  EOMS Intact, PERRLA, C and S clear, Funduscopic exam not done. ENT:  ears, nose and throat reveal no gross abnormalities.  Dentition good. Neck:  supple, without massess. No JVD, thyromegaly or carotid bruits. Carotid upstroke normal. Chest:  normal symmetry, clear to auscultation and percussion. Cardiac:  regular rhythm, normal S1 and S2, No S3 or S4, no murmurs, gallops or rubs detected. Abdomen:  abdomen soft,non-tender, no masses, no hepatospenomegaly, or aneurysm noted Peripheral Pulses:  the femoral,dorsalis pedis, and posterior tibial pulses are full and equal bilaterally with no bruits auscultated. Extremities & Back:  no deformities, clubbing, cyanosis, erythema or edema observed. Normal muscle strength and tone. Neurological:  no gross motor or sensory deficits noted, affect appropriate,  Somewhat slow to answer questions, not oriented to month, does know year and day of the week. ____________________________ IMPRESSIONS/PLAN   1. Syncopal episode which occurred while sitting on the commode that could have been due to hypotension or bradycardia or may  have been a vasovagal episode  2. Asymptomatic carotid artery disease  3. Dementia  4. Hypertension  5.  Hyperlipidemia   recommendations:  EKG today shows an incomplete right bundle branch block and sinus bradycardia. I would like to obtain hospital records from [facility] to see what was done in terms of workup. I have recommended that she wear a Cardiac event monitor to determine if she is having any bradycardia. ____________________________ TODAYS ORDERS  1. 12 Lead EKG: Today  2. [place]: 1 Day  3. Return Visit: 1 month  4. [facility] Records                       ____________________________ Cardiology Physician:  Darden Palmer MD [facility]

## 2013-03-05 NOTE — Telephone Encounter (Signed)
I called Alicia [name] and let her know that forms have been faxed and she can pick up a copy at the front desk.

## 2013-03-05 NOTE — Progress Notes (Signed)
Patient ID: Alicia [name], female   DOB: 1923/08/23, 79 y.o.   MRN: [id number]   Alicia [name]    Date of visit:  03/05/2013 DOB:  04/16/1923    Age:  79 yrs. Medical record number:  [id number]     Account number:  [id number] Primary Care Provider: Synetta Fail ____________________________ CURRENT DIAGNOSES  1. Syncope  2. Hyperlipidemia  3. Hypertension,Essential (Benign)  4. Carotid Artery Stenosis ____________________________ ALLERGIES  Sulfa (Sulfonamides), Intolerance-unknown ____________________________ MEDICATIONS  1. lisinopril 20 mg tablet, BID  2. donepezil 5 mg tablet, QHS  3. oxybutynin chloride 10 mg tablet extended release 24hr, 1 p.o. daily  4. aspirin 81 mg tablet,chewable, 1 p.o. daily  5. sertraline 50 mg tablet, 1 p.o. daily  6. multivitamin tablet, 1 p.o. daily  7. Calcium 500 + D 500 mg(1,250mg ) -400 unit tablet,chewable, 1 p.o. daily  8. atorvastatin 20 mg tablet, 1 p.o. daily  9. metoprolol succinate 50 mg tablet extended release 24 hr, 1/2 tab daily ____________________________ CHIEF COMPLAINTS  Followup of Syncope ____________________________ HISTORY OF PRESENT ILLNESS  Patient seen for cardiac followup. She has had no recurrent syncopal episodes. She has worn a cardiac event monitor that has shown no significant bradycardia. She has had several episodes of some syncope one occurred on the toilet and the other 2 when she was upright. One would wonder whether she is having some episodic hypotension that is orthostatic that could account for this. Otherwise she is doing fairly well.  ____________________________ PAST HISTORY  Past Medical Illnesses:  hypertension, hyperlipidemia, ovarian cancer treated with surgery and chemo, dementia;  Cardiovascular Illnesses:  Carotid artery disease;  Surgical Procedures:  appendectomy, cataract extraction OU, hysterectomy-total, oophorectomy, colon blockage, fx rt ankle;  Cardiology Procedures-Invasive:  no history of  prior cardiac procedures;  Cardiology Procedures-Noninvasive:  no previous non-invasive procedures;  LVEF not documented,   ____________________________ CARDIO-PULMONARY TEST DATES EKG Date:  01/28/2013;  Holter/Event Monitor Date: 01/29/2013;   ____________________________ SOCIAL HISTORY Alcohol Use:  no alcohol use;  Smoking:  never smoked;  Diet:  regular diet;  Lifestyle:  widowed and  lives with son;  Exercise:  no regular exercise;  Occupation:  retired Teacher, early years/pre;   ____________________________ REVIEW OF SYSTEMS Eyes: wears eye glasses/contact lenses, cataract extraction bilaterally Respiratory: denies dyspnea, cough, wheezing or hemoptysis. Cardiovascular:  please review HPI Neurological:  denies headaches, stroke, or TIA,.  ____________________________ PHYSICAL EXAMINATION VITAL SIGNS  Blood Pressure:  132/60 Sitting, Right arm, regular cuff   Pulse:  60/min. Weight:  118.00 lbs. Height:  62"BMI: 21  Constitutional:  pleasant white female, in no acute distress Skin:  warm and dry to touch, no apparent skin lesions, or masses noted. Head:  normocephalic, normal hair pattern, no masses or tenderness Neck:  supple, without massess. No JVD, thyromegaly or carotid bruits. Carotid upstroke normal. Chest:  normal symmetry, clear to auscultation and percussion. Cardiac:  regular rhythm, normal S1 and S2, No S3 or S4, no murmurs, gallops or rubs detected. Peripheral Pulses:  the femoral,dorsalis pedis, and posterior tibial pulses are full and equal bilaterally with no bruits auscultated. Extremities & Back:  no deformities, clubbing, cyanosis, erythema or edema observed. Normal muscle strength and tone. Neurological:  no gross motor or sensory deficits noted, affect appropriate, oriented x3. ____________________________ IMPRESSIONS/PLAN  1. Recent syncopal episode with no cardiac arrhythmias noted  Recommendations:  At this point she has no significant bradycardia. If she does have  recurrent syncope she would likely need to have an implantable loop  monitor to be sure she is not having intermittent bradycardia that was undetected by the event monitor. A tilit table could also be considered.  I would be glad to see her in the future should the need arise. ____________________________ TODAYS ORDERS  1. Return: prn                       ____________________________ Cardiology Physician:  Darden Palmer MD [facility]

## 2013-03-12 ENCOUNTER — Ambulatory Visit (INDEPENDENT_AMBULATORY_CARE_PROVIDER_SITE_OTHER): Payer: Medicare Other | Admitting: Neurology

## 2013-03-12 ENCOUNTER — Encounter: Payer: Self-pay | Admitting: Neurology

## 2013-03-12 VITALS — BP 160/67 | HR 61 | Temp 97.1°F | Ht 62.0 in | Wt 117.0 lb

## 2013-03-12 DIAGNOSIS — R413 Other amnesia: Secondary | ICD-10-CM

## 2013-03-12 DIAGNOSIS — R55 Syncope and collapse: Secondary | ICD-10-CM

## 2013-03-12 NOTE — Patient Instructions (Addendum)
I think overall you are doing fairly well but I do want to suggest a few things today:  Remember to drink plenty of fluid, eat healthy meals and do not skip any meals. Try to eat protein with a every meal and eat a healthy snack such as fruit or nuts in between meals. Try to keep a regular sleep-wake schedule and try to exercise daily, particularly in the form of walking, 20-30 minutes a day, if you can.   Engage in social activities in your community and with your family and try to keep up with current events by reading the newspaper or watching the news.   As far as your medications are concerned, I would like to suggest increasing Aricept to 10 mg daily. Take Aricept 5 mg: 2 pills at night, call for refill, in which case we will do the 10 mg strength. Pls monitor BP and Pulse every few days in the first 2 weeks. Report BP values less than 105/55 and pulse values less than 55.  As far as diagnostic testing: no new test needed.   I would like to see you back in 4 months, sooner if we need to. Please call us with any interim questions, concerns, problems, updates or refill requests.  Please also call us for any test results so we can go over those with you on the phone. Brett Canales is my clinical assistant and will answer any of your questions and relay your messages to me and also relay most of my messages to you.  Our phone number is [phone]. We also have an after hours call service for urgent matters and there is a physician on-call for urgent questions. For any emergencies you know to call 911 or go to the nearest emergency room.

## 2013-03-12 NOTE — Progress Notes (Signed)
Subjective:    Patient ID: Alicia [name] is a 79 y.o. female.  HPI   Interim history:   Alicia [name] Is a very pleasant 79 year old right-handed woman who presents for followup consultation of her memory loss and intermittent confusion. I/A first met her and her daughter who accompanies her again today on 12/10/2012 and she previously followed with Dr. Avie Echevaria. She has an underlying medical history of cancer, depression, anxiety, hyperlipidemia, syncope, weight loss and memory loss. She has had MRI of the brain in June 2011 which showed chronic microvascular ischemia and generalized atrophy, RPR and B12 were normal in June 2011. She was started on Aricept but discontinued the medication and then restarted the medication. She was admitted to [facility] in March for an episode of unresponsiveness. At the time of her visit I suggested an EEG as well as a cardiology evaluation. I did not make any changes in her medications. She's currently on Aricept 5 mg daily and has not increased to 10 mg because of concern for bradycardia. She is on lisinopril, Toprol-XL, multivitamin, Ditropan, Zoloft, Norvasc, baby aspirin, Lipitor, calcium. Her EEG was normal for awake.  She had a Holter for 30 days, which was normal. She has seen Dr. Ree Kida. He lowered her metoprolol to 25 mg, and she had discontinued her amlodipine per PCP.    Her Past Medical History Is Significant For: Past Medical History  Diagnosis Date  . Dementia   . Renal disease   . Hypertension   . Memory loss 12/10/2012  . Depression 12/10/2012  . Anxiety state, unspecified 12/10/2012  . Syncope 12/10/2012  . Spells 12/10/2012  . Essential hypertension, benign 12/10/2012  . Other and unspecified hyperlipidemia 12/10/2012  . Loss of weight 12/10/2012    Her Past Surgical History Is Significant For: History reviewed. No pertinent past surgical history.  Her Family History Is Significant For: No family history  on file.  Her Social History Is Significant For: History   Social History  . Marital Status: Married    Spouse Name: N/A    Number of Children: N/A  . Years of Education: N/A   Social History Main Topics  . Smoking status: Never Smoker   . Smokeless tobacco: None  . Alcohol Use: No  . Drug Use: No  . Sexually Active: No   Other Topics Concern  . None   Social History Narrative  . None    Her Allergies Are:  Allergies  Allergen Reactions  . Iohexol Rash  . Sulfa Antibiotics Hives and Itching  :   Her Current Medications Are:  Outpatient Encounter Prescriptions as of 03/12/2013  Medication Sig Dispense Refill  . aspirin EC 81 MG tablet Take 81 mg by mouth daily.      Marland Kitchen atorvastatin (LIPITOR) 20 MG tablet Take 20 mg by mouth daily.      . calcium carbonate 200 MG capsule Take 600 mg by mouth daily. Take at night      . donepezil (ARICEPT) 5 MG tablet Take 1 tablet (5 mg total) by mouth at bedtime.  90 tablet  3  . lisinopril (PRINIVIL,ZESTRIL) 20 MG tablet Take 20 mg by mouth 2 (two) times daily.      . metoprolol succinate (TOPROL-XL) 50 MG 24 hr tablet Take 25 mg by mouth daily. Take with or immediately following a meal.      . Multiple Vitamin (MULTIVITAMIN WITH MINERALS) TABS Take 1 tablet by mouth daily.      Marland Kitchen  oxybutynin (DITROPAN-XL) 10 MG 24 hr tablet Take 10 mg by mouth daily.      . sertraline (ZOLOFT) 50 MG tablet Take 50 mg by mouth daily.      . [DISCONTINUED] amLODipine (NORVASC) 10 MG tablet Take 10 mg by mouth daily.       No facility-administered encounter medications on file as of 03/12/2013.   Review of Systems  HENT: Positive for hearing loss.   Cardiovascular: Positive for leg swelling.  Psychiatric/Behavioral: Positive for confusion.       Memory loss     Objective:  Neurologic Exam  Physical Exam Physical Examination:   Filed Vitals:   03/12/13 1140  BP: 160/67  Pulse: 61  Temp: 97.1 F (36.2 C)    General Examination: The patient  is a very pleasant 79 y.o. female in no acute distress. She is calm and cooperative with the exam. She denies Auditory Hallucinations and Visual Hallucinations.    HEENT: Normocephalic, atraumatic, pupils are equal, round and reactive to light and accommodation. Extraocular tracking shows moderate saccadic breakdown without nystagmus noted. Hearing is impaired. Face is symmetric with mild facial masking and normal facial sensation. There is a mild intermittent lip, and lower jaw tremor. Neck is supple rigid with intact passive ROM. There are no carotid bruits on auscultation. Oropharynx exam reveals no mouth dryness. No significant airway crowding is noted. Mallampati is class II. Tongue protrudes centrally and palate elevates symmetrically.    Chest: is clear to auscultation without wheezing, rhonchi or crackles noted.  Heart: sounds are regular and normal without murmurs, rubs or gallops noted.   Abdomen: is soft, non-tender and non-distended with normal bowel sounds appreciated on auscultation.  Extremities: There is no pitting edema in the distal lower extremities bilaterally. Pedal pulses are intact.  Skin: is warm and dry with no trophic changes noted.  Musculoskeletal: exam reveals no obvious joint deformities, tenderness or joint swelling or erythema.  Neurologically:  Mental status: The patient is awake and alert, paying fair  attention. She is unable to provide much in the way of history. She is oriented to: person, place, time/date, day of week, month of year and year. Her memory, attention, language and knowledge are impaired. There is no aphasia, agnosia, apraxia or anomia. There is a mild degree of bradyphrenia. Speech is mildly hypophonic with no dysarthria noted. Mood is congruent and affect is normal.    Cranial nerves are as described above under HEENT exam. In addition, shoulder shrug is normal with equal shoulder height noted.  Motor exam: Normal bulk, and strength for age  is noted. Tone is not rigid with absence of cogwheeling in the extremities. There is overall mild bradykinesia. There is no drift or rebound. Romberg is negative. Reflexes are 2+ in the upper extremities and 2+ in the lower extremities. Fine motor skills: Finger taps, hand movements, and rapid alternating patting are mildly impaired bilaterally. Foot taps and foot agility are mildly impaired bilaterally.   Cerebellar testing shows no dysmetria or intention tremor on finger to nose testing. There is no truncal or gait ataxia.   Sensory exam is intact to light touch, pinprick, vibration, temperature sense.   Gait, station and balance: She stands up from the seated position with mild difficulty and needs to push herself up. No veering to one side is noted. No leaning to one side. Posture is mildly stooped. Stance is wide-based. She turns in 3 steps. Tandem walk is not possible. Balance is mildly impaired.  Assessment and Plan:     In summary, Alicia [name] is a very pleasant 79 y.o.-year old female with a history of memory loss and what sounds like syncopal and pre-syncopal spells. She is doing fairly well at this time, and had recent reduction in her betablocker. In the past she had been on 10 mg of aricept, but Dr. Sandria Manly lowered it because of concern for bradycardia.   I had a long chat with the patient and her daughter-in-law about my findings and the diagnosis, its prognosis and treatment options. We talked about medical treatments and non-pharmacological approaches. We talked about maintaining a healthy lifestyle in general. I encouraged the patient to eat healthy, exercise daily and keep well hydrated, to keep a scheduled bedtime and wake time routine, to not skip any meals and eat healthy snacks in between meals and to have protein with every meal.  I recommended the following at this time: I would like to go ahead try her on Aricept 10 mg again. I asked them to keep a log of her BP and  pulse values in the first 2 weeks. I filled out forms for her long term care insurance and also renewed her prescription. I answered all their questions and the patient and her daughter-in-law are in agreement with the plan. I would like to see her back in 4 months, sooner if the need arises and encouraged them to call with any interim questions, concerns, problems or updates and refill requests.

## 2013-04-11 ENCOUNTER — Ambulatory Visit: Payer: Self-pay | Admitting: Neurology

## 2013-06-21 ENCOUNTER — Encounter: Payer: Self-pay | Admitting: Neurology

## 2013-06-21 ENCOUNTER — Ambulatory Visit (INDEPENDENT_AMBULATORY_CARE_PROVIDER_SITE_OTHER): Payer: Medicare Other | Admitting: Neurology

## 2013-06-21 ENCOUNTER — Encounter (INDEPENDENT_AMBULATORY_CARE_PROVIDER_SITE_OTHER): Payer: Self-pay

## 2013-06-21 VITALS — BP 169/69 | HR 53 | Temp 97.7°F | Ht 62.0 in | Wt 119.0 lb

## 2013-06-21 DIAGNOSIS — I498 Other specified cardiac arrhythmias: Secondary | ICD-10-CM

## 2013-06-21 DIAGNOSIS — F411 Generalized anxiety disorder: Secondary | ICD-10-CM

## 2013-06-21 DIAGNOSIS — R634 Abnormal weight loss: Secondary | ICD-10-CM

## 2013-06-21 DIAGNOSIS — I959 Hypotension, unspecified: Secondary | ICD-10-CM

## 2013-06-21 DIAGNOSIS — R6889 Other general symptoms and signs: Secondary | ICD-10-CM

## 2013-06-21 DIAGNOSIS — R413 Other amnesia: Secondary | ICD-10-CM

## 2013-06-21 DIAGNOSIS — R001 Bradycardia, unspecified: Secondary | ICD-10-CM

## 2013-06-21 DIAGNOSIS — F0390 Unspecified dementia without behavioral disturbance: Secondary | ICD-10-CM

## 2013-06-21 MED ORDER — DONEPEZIL HCL 5 MG PO TABS: 5.0000 mg | ORAL_TABLET | Freq: Every day | ORAL | Status: DC

## 2013-06-21 NOTE — Patient Instructions (Addendum)
I think overall you are doing fairly well but I do want to suggest a few things today:  Remember to drink plenty of fluid, eat healthy meals and do not skip any meals. Try to eat protein with a every meal and eat a healthy snack such as fruit or nuts in between meals. Try to keep a regular sleep-wake schedule and try to exercise daily, particularly in the form of walking, 20-30 minutes a day, if you can.   Engage in social activities in your community and with your family and try to keep up with current events by reading the newspaper or watching the news.   As far as your medications are concerned, I would like to suggest going back to donepezil 5 mg daily. Call in 1 month to discuss trial of Namenda XR, we can provide you with samples for the 7 mg strength at the time.   As far as diagnostic testing: no new test today.  I would like to see you back in 3 months, sooner if we need to. Please call us with any interim questions, concerns, problems, updates or refill requests.  Please also call us for any test results so we can go over those with you on the phone. Brett Canales is my clinical assistant and will answer any of your questions and relay your messages to me and also relay most of my messages to you.  Our phone number is [phone]. We also have an after hours call service for urgent matters and there is a physician on-call for urgent questions. For any emergencies you know to call 911 or go to the nearest emergency room.

## 2013-06-21 NOTE — Progress Notes (Signed)
Subjective:    Patient ID: Alicia [name] is a 79 y.o. female.  HPI  Interim history:   Alicia [name] is a very pleasant 79 year old right-handed woman who presents for followup consultation of her memory loss and intermittent confusion. She is accompanied by her daughter-in-law, Alicia [name], again today. I last saw her on 03/12/2013, at which time I increased her Aricept from 5 mg to 10 mg again. I first met her on 12/10/2012 and she previously followed with Alicia [name]. She has an underlying medical history of cancer, tremor, depression, anxiety, hyperlipidemia, syncope, weight loss and memory loss. She has had MRI of the brain in June 2011 which showed chronic microvascular ischemia and generalized atrophy, RPR and B12 were normal in June 2011. She was started on Aricept but discontinued the medication and then restarted it. She was admitted to [facility] in 3/14 for an episode of unresponsiveness. At the time of her first visit I suggested an EEG as well as cardiology evaluation. I did not make any changes in her medications. Her EEG was normal in the awake state. She had a 30 day Holter, which was normal. She saw Dr. Ree [name]. He lowered her metoprolol to 25 mg, and she had discontinued her amlodipine per PCP previously. Her Aricept had been decreased in the past d/t concern of bradycardia.  She has been taking Aricept 10 mg daily since her visit in July 2014 and has had no recent issues with confusion or pre-syncope. She had low BP last night and she had low K and she was not given her Aricept and her lisinopril last night and not this morning. She has been more sedentary. She has also been sleep walking. She left the house one day in the early morning and they had to call 911 and she was found walking on a neighboring street and she had sat down in a neighbor's garage. She needs more prompting with simple tasks. They have had an aid daily from 11 AM to 2 PM, but she quit  today. The patient has had some AH and rare VH. They seem to be transformational. She has been sleeping fairly well, but at times she wanders at night and is easily redirected.   Her Past Medical History Is Significant For: Past Medical History  Diagnosis Date  . Dementia   . Renal disease   . Hypertension   . Memory loss 12/10/2012  . Depression 12/10/2012  . Anxiety state, unspecified 12/10/2012  . Syncope 12/10/2012  . Spells 12/10/2012  . Essential hypertension, benign 12/10/2012  . Other and unspecified hyperlipidemia 12/10/2012  . Loss of weight 12/10/2012    Her Past Surgical History Is Significant For: History reviewed. No pertinent past surgical history.  Her Family History Is Significant For: No family history on file.  Her Social History Is Significant For: History   Social History  . Marital Status: Married    Spouse Name: N/A    Number of Children: N/A  . Years of Education: N/A   Social History Main Topics  . Smoking status: Never Smoker   . Smokeless tobacco: None  . Alcohol Use: No  . Drug Use: No  . Sexual Activity: No   Other Topics Concern  . None   Social History Narrative  . None    Her Allergies Are:  Allergies  Allergen Reactions  . Iohexol Rash  . Sulfa Antibiotics Hives and Itching  :   Her Current Medications Are:  Outpatient  Encounter Prescriptions as of 06/21/2013  Medication Sig Dispense Refill  . aspirin EC 81 MG tablet Take 81 mg by mouth daily.      Marland Kitchen atorvastatin (LIPITOR) 20 MG tablet Take 20 mg by mouth daily.      . calcium carbonate 200 MG capsule Take 600 mg by mouth daily. Take at night      . metoprolol succinate (TOPROL-XL) 50 MG 24 hr tablet Take 25 mg by mouth daily. Take with or immediately following a meal.      . Multiple Vitamin (MULTIVITAMIN WITH MINERALS) TABS Take 1 tablet by mouth daily.      Marland Kitchen oxybutynin (DITROPAN-XL) 10 MG 24 hr tablet Take 10 mg by mouth daily.      . sertraline (ZOLOFT) 50 MG tablet Take 50  mg by mouth daily.      Marland Kitchen donepezil (ARICEPT) 5 MG tablet Take 1 tablet (5 mg total) by mouth at bedtime.  90 tablet  3  . lisinopril (PRINIVIL,ZESTRIL) 20 MG tablet Take 20 mg by mouth 2 (two) times daily.       No facility-administered encounter medications on file as of 06/21/2013.  :  Review of Systems  HENT: Positive for hearing loss and rhinorrhea.   Respiratory: Positive for shortness of breath.   Allergic/Immunologic: Positive for environmental allergies.  Neurological: Positive for tremors.       Memory loss  Psychiatric/Behavioral: Positive for confusion and sleep disturbance (Somnambulating).    Objective:  Neurologic Exam  Physical Exam Physical Examination:   Filed Vitals:   06/21/13 1115  BP: 169/69  Pulse: 53  Temp: 97.7 F (36.5 C)    General Examination: The patient is a very pleasant 79 y.o. female in no acute distress. She is calm and cooperative with the exam. She denies Auditory Hallucinations and Visual Hallucinations.   HEENT: Normocephalic, atraumatic, pupils are equal, round and reactive to light and accommodation. Extraocular tracking shows moderate saccadic breakdown without nystagmus noted. Hearing is impaired. Face is symmetric with mild facial masking and normal facial sensation. There is a mild to moderate intermittent lip, and lower jaw tremor. Neck is supple rigid with intact passive ROM. There are no carotid bruits on auscultation. Oropharynx exam reveals no mouth dryness. No significant airway crowding is noted. Mallampati is class II. Tongue protrudes centrally and palate elevates symmetrically.    Chest: is clear to auscultation without wheezing, rhonchi or crackles noted.  Heart: sounds are regular and normal without murmurs, rubs or gallops noted.   Abdomen: is soft, non-tender and non-distended with normal bowel sounds appreciated on auscultation.  Extremities: There is no pitting edema in the distal lower extremities bilaterally. Pedal  pulses are intact.  Skin: is warm and dry with no trophic changes noted.  Musculoskeletal: exam reveals no obvious joint deformities, tenderness or joint swelling or erythema.  Neurologically:  Mental status: The patient is awake and alert, paying fair  attention. She is unable to provide much in the way of history. She is oriented to: person, place, time/date, day of week, month of year and year. Her memory, attention, language and knowledge are impaired. There is no aphasia, agnosia, apraxia or anomia. There is a mild degree of bradyphrenia. Speech is mildly hypophonic with no dysarthria noted. Mood is congruent and affect is normal.    Cranial nerves are as described above under HEENT exam. In addition, shoulder shrug is normal with equal shoulder height noted.  Motor exam: Normal bulk, and strength for age is  noted. Tone is not rigid with absence of cogwheeling in the extremities. There is overall mild bradykinesia. There is no drift or rebound. Romberg is negative. Reflexes are 2+ in the upper extremities and 2+ in the lower extremities. Fine motor skills: Finger taps, hand movements, and rapid alternating patting are mildly impaired bilaterally. Foot taps and foot agility are mildly impaired bilaterally. She has an intermittent hand tremor.   Cerebellar testing shows no dysmetria or intention tremor on finger to nose testing. There is no truncal or gait ataxia.   Sensory exam is intact to light touch, pinprick, vibration, temperature sense.   Gait, station and balance: She stands up from the seated position with mild difficulty and needs to push herself up. No veering to one side is noted. No leaning to one side. Posture is mildly stooped. Stance is wide-based. She turns in 3 steps. Tandem walk is not possible. Balance is mildly impaired.    Assessment and Plan:    In summary, SANA TESSMER is a very pleasant 19 -year old female with a history of memory loss and what sounds like  syncopal and pre-syncopal spells. She has had recent low BP and low heart rate, recent VH and AH and sleep walking. This may be all in the context of progression of her dementia.. She has been on 10 mg of aricept, but I am going to have to lower it because of concern for bradycardia. She also had fluctuation in her potassium and BP and will need to be stabilized in that.    I had a long chat with the patient and her daughter-in-law about my findings and the diagnosis of advanced dementia, its prognosis and treatment options. We talked about medical treatments and non-pharmacological approaches. We talked about maintaining a healthy lifestyle in general. I encouraged the patient to eat healthy, exercise daily and keep well hydrated, to keep a scheduled bedtime and wake time routine, to not skip any meals and eat healthy snacks in between meals and to have protein with every meal.  I recommended the following at this time: I would like to go ahead and reduce the Aricept to 5 mg again. I asked them to keep a log of her BP and pulse values in the first 2 weeks. I will consider Namenda XR 7 mg next, perhaps a month from now. I have asked Alicia [name] to call in one month for an update. I also renewed her prescription for Aricept 5 mg. I answered all their questions and the patient and her daughter-in-law are in agreement with the plan. I would like to see her back in 3 months, sooner if the need arises and encouraged them to call with any interim questions, concerns, problems or updates and refill requests.

## 2013-07-11 ENCOUNTER — Encounter: Payer: Self-pay | Admitting: Cardiology

## 2013-07-11 ENCOUNTER — Telehealth: Payer: Self-pay | Admitting: *Deleted

## 2013-07-11 NOTE — Progress Notes (Signed)
Patient ID: Alicia [name], female   DOB: 1923-05-02, 79 y.o.   MRN: [id number]   Alicia [name]    Date of visit:  07/11/2013 DOB:  1923-07-17    Age:  79 yrs. Medical record number:  [id number]     Account number:  [id number] Primary Care Provider: Synetta Fail ____________________________ CURRENT DIAGNOSES  1. Syncope  2. Hyperlipidemia  3. Hypertension,Essential (Benign)  4. Carotid Artery Stenosis ____________________________ ALLERGIES  Ciprofloxacin, Weakness present  IVP Dye, Iodine Containing, Rash  NSAIDS, Renal insufficiency  Sulfa (Sulfonamides), Intolerance-unknown ____________________________ MEDICATIONS  1. donepezil 5 mg tablet, QHS  2. oxybutynin chloride 10 mg tablet extended release 24hr, 1 p.o. daily  3. aspirin 81 mg tablet,chewable, 1 p.o. daily  4. sertraline 50 mg tablet, 1 p.o. daily  5. multivitamin tablet, 1 p.o. daily  6. Calcium 500 + D 500 mg(1,250mg ) -400 unit tablet,chewable, 1 p.o. daily  7. atorvastatin 20 mg tablet, 1 p.o. daily  8. Claritin 10 mg tablet, 1 p.o. daily  9. alendronate 70 mg tablet, weekly ____________________________ CHIEF COMPLAINTS  Syncope ____________________________ HISTORY OF PRESENT ILLNESS  Patient returns for evaluation of syncope. She has had some intermittent sporadic episodes of syncope and has significant dementia. She wore a cardiac event monitor for 30 days after her last episode earlier in the spring which did not show any bradycardia. A she had been placed on amlodipine 2.5 mg and was sitting in a chair yesterday. A relative was attempted to take her blood pressure and then the patient slumped over in front of her and then she began to jerk son. The relative did not check her blood pressure or pulse during this time but called 911. The patient may have been somewhat lethargic prior to this and they have been trying to fall asleep prior to this. She was later taken to her primary care physician's office where an  event monitor was refused and then she was given an appointment to be seen here today. She is feeling well today but has significant dementia. The patient denies any shortness of breath or angina but is very unsteady on her feet. ____________________________ PAST HISTORY  Past Medical Illnesses:  hypertension, hyperlipidemia, ovarian cancer treated with surgery and chemo, dementia;  Cardiovascular Illnesses:  Carotid artery disease, syncope;  Surgical Procedures:  appendectomy, cataract extraction OU, hysterectomy-total, oophorectomy, colon blockage, fx rt ankle;  Cardiology Procedures-Invasive:  no history of prior cardiac procedures;  Cardiology Procedures-Noninvasive:  no previous non-invasive procedures;  LVEF not documented,   ____________________________ CARDIO-PULMONARY TEST DATES EKG Date:  07/11/2013;  Holter/Event Monitor Date: 01/29/2013;   ____________________________ FAMILY HISTORY Father -- Father dead, Deceased Mother -- Carcinoma of breast, Mother dead, Deceased Sister -- Sister alive with problem Sister -- Sister alive and well ____________________________ SOCIAL HISTORY Alcohol Use:  no alcohol use;  Smoking:  never smoked;  Diet:  regular diet;  Lifestyle:  widowed and  lives with son;  Exercise:  no regular exercise;  Occupation:  retired Teacher, early years/pre;   ____________________________ REVIEW OF SYSTEMS General:  denies recent weight change, fatique or change in exercise tolerance. Eyes: wears eye glasses/contact lenses, cataract extraction bilaterally Respiratory: denies dyspnea, cough, wheezing or hemoptysis. Cardiovascular:  please review HPI Abdominal: denies dyspepsia, GI bleeding, constipation, or diarrheaGenitourinary-Female: no dysuria, urgency, frequency, UTIs, or stress incontinence Musculoskeletal:  denies arthritis, venous insufficiency, or muscle weakness. Neurological:  dementia  ____________________________ PHYSICAL EXAMINATION VITAL SIGNS  Blood Pressure:  130/70  Sitting, Left arm, regular cuff  , 128/70  Standing, Left arm and regular cuff   Pulse:  80/min. Weight:  119.00 lbs. Height:  62"BMI: 22  Constitutional:  pleasant white female, in no acute distress Skin:  warm and dry to touch, no apparent skin lesions, or masses noted. Head:  normocephalic, normal hair pattern, no masses or tenderness Neck:  supple, without massess. No JVD, thyromegaly or carotid bruits. Carotid upstroke normal. Chest:  normal symmetry, clear to auscultation and percussion. Cardiac:  regular rhythm, normal S1 and S2, No S3 or S4, no murmurs, gallops or rubs detected. Peripheral Pulses:  the femoral,dorsalis pedis, and posterior tibial pulses are full and equal bilaterally with no bruits auscultated. Extremities & Back:  no deformities, clubbing, cyanosis, erythema or edema observed. Normal muscle strength and tone. Neurological:  no gross motor or sensory deficits noted, affect appropriate, not oriented to date.  Mildly unsteady gait ____________________________ IMPRESSIONS/PLAN  1. Recurrent syncopal episodes in an elderly woman with dementia 2. Mildly low blood pressure in a patient with previous hypertension 3. Dementia  Recommendations:  EKG shows sinus rhythm and incomplete right bundle branch block pattern. The recommendations would be for her to have an implantable loop monitor placed. Her episodes of syncope are infrequent. I think it could be multifactorial as to what is going on there. At least an implantable of monitor would exclude bradycardia as a cause. I will see her in followup after the monitor is implanted.I asked them to stop the amlodipine for the time being. In addition the neurologist told them to stop the Aricept. ____________________________ TODAYS ORDERS  1.  Consult  [place] EP implant loop mon: Schedule at convenience  2. 12 Lead EKG: Today                       ____________________________ Cardiology Physician:  Darden Palmer MD  [facility]

## 2013-07-11 NOTE — Telephone Encounter (Signed)
Daughter calling, pt had syncope episode yesterday.  Ate lunch, sitting at breakfast bar, was in and out, glazed, seemed delayed in and out (processing) wise since morning.  Yawning a lot since prior to episode, (passed out then jerked several times, drooling).  Out for 30-35sec.  EMT checked Bp low, P 60-64.  Stablilized, then saw Dr. Derrell Lolling, with [facility], pcp, ysterday.  Bp higher, pulse normal.  Has appt with Dr. Donnie Aho today at 1330.  Had been of Bp meds last week per Dr. Derrell Lolling,  Amlodipine 2.5mg  po daily.  I consulted Dr. Frances Furbish.  She stated to stop aricept.  Monitor. Keep hydrated, eat regularly.  Marland Kitchen Keep appt. with Dr. Donnie Aho.  Call back as needed.  Daughter verbalized understanding.

## 2013-07-13 HISTORY — PX: OTHER SURGICAL HISTORY: SHX169

## 2013-07-15 ENCOUNTER — Encounter: Payer: Self-pay | Admitting: Internal Medicine

## 2013-07-17 ENCOUNTER — Ambulatory Visit (INDEPENDENT_AMBULATORY_CARE_PROVIDER_SITE_OTHER): Payer: Medicare Other | Admitting: Internal Medicine

## 2013-07-17 ENCOUNTER — Ambulatory Visit (HOSPITAL_COMMUNITY)
Admission: RE | Admit: 2013-07-17 | Discharge: 2013-07-17 | Disposition: A | Discharge status: Home / Self Care | Payer: Medicare Other | Source: Ambulatory Visit | Attending: Internal Medicine | Admitting: Internal Medicine

## 2013-07-17 ENCOUNTER — Encounter (HOSPITAL_COMMUNITY)
Admission: RE | Disposition: A | Discharge status: Home / Self Care | Payer: Self-pay | Source: Ambulatory Visit | Attending: Internal Medicine

## 2013-07-17 ENCOUNTER — Encounter: Payer: Self-pay | Admitting: Internal Medicine

## 2013-07-17 VITALS — BP 146/71 | HR 70 | Ht 62.0 in | Wt 118.1 lb

## 2013-07-17 DIAGNOSIS — R55 Syncope and collapse: Secondary | ICD-10-CM

## 2013-07-17 DIAGNOSIS — I10 Essential (primary) hypertension: Secondary | ICD-10-CM | POA: Insufficient documentation

## 2013-07-17 DIAGNOSIS — E785 Hyperlipidemia, unspecified: Secondary | ICD-10-CM | POA: Insufficient documentation

## 2013-07-17 DIAGNOSIS — F0390 Unspecified dementia without behavioral disturbance: Secondary | ICD-10-CM | POA: Insufficient documentation

## 2013-07-17 HISTORY — PX: LOOP RECORDER IMPLANT: SHX5477

## 2013-07-17 SURGERY — LOOP RECORDER IMPLANT
Anesthesia: LOCAL

## 2013-07-17 MED ORDER — LIDOCAINE HCL (PF) 1 % IJ SOLN
INTRAMUSCULAR | Status: AC
  Filled 2013-07-17: qty 30

## 2013-07-17 NOTE — Progress Notes (Signed)
Primary Care Physician: Romie Jumper, PA-C Referring Physician:  Dr Ezra Sites is a 79 y.o. female with a h/o dementia and recurrent syncope who presents for EP consultation.  The patient has difficulty providing history and history is therefore per Dr York Spaniel notes and pts daughter in law. The patient has had 4 episodes of syncope over the past year or so.  No cause has been determined.  Her daughter in law has observed at least one episode.  She reports that the patient becomes progressively somnolent and then loses consciousness for a few seconds.  She then has a brief (1-2 seconds) period of spastic shaking and returns to her baseline.  She has worn an event monitor without having syncope.  Due to infrequent nature, a cause has not been able to be determined.  Today, she denies symptoms of palpitations, chest pain, shortness of breath, orthopnea, PND, lower extremity edema, or neurologic sequela. The patient is tolerating medications without difficulties and is otherwise without complaint today.   Past Medical History  Diagnosis Date  . Dementia     significant  . Renal disease   . Memory loss 12/10/2012  . Depression 12/10/2012  . Anxiety state, unspecified 12/10/2012  . Spells 12/10/2012  . Essential hypertension, benign 12/10/2012  . Other and unspecified hyperlipidemia 12/10/2012  . Loss of weight 12/10/2012  . Carotid artery stenosis   . Ovarian cancer     treated with surgery & chemo  . Ankle fracture, right   . Unstable gait     mild  . Syncope 12/10/2012    Recurrent, sporadic episodes  . Carotid artery disease    Past Surgical History  Procedure Laterality Date  . Vesicovaginal fistula closure w/ tah  2005  . Oophorectomy  2007    for ovarian cancer  . Prolapsed uterine fibroid ligation    . Appendectomy    . Cataract extraction Bilateral     Current Outpatient Prescriptions  Medication Sig Dispense Refill  . aspirin EC 81 MG tablet Take 81 mg by  mouth daily.      Marland Kitchen atorvastatin (LIPITOR) 20 MG tablet Take 20 mg by mouth daily.      . calcium carbonate 200 MG capsule Take 600 mg by mouth daily. Take at night      . Multiple Vitamin (MULTIVITAMIN WITH MINERALS) TABS Take 1 tablet by mouth daily.      Marland Kitchen oxybutynin (DITROPAN-XL) 10 MG 24 hr tablet Take 10 mg by mouth daily.      . sertraline (ZOLOFT) 50 MG tablet Take 50 mg by mouth daily.       No current facility-administered medications for this visit.    Allergies  Allergen Reactions  . Iohexol Rash  . Ciprofloxacin Other (See Comments)    weakness  . Nsaids     Renal insufficiency  . Sulfa Antibiotics Hives and Itching  . Ivp Dye [Iodinated Diagnostic Agents] Rash    History   Social History  . Marital Status: Married    Spouse Name: N/A    Number of Children: N/A  . Years of Education: N/A   Occupational History  . Not on file.   Social History Main Topics  . Smoking status: Never Smoker   . Smokeless tobacco: Not on file  . Alcohol Use: No  . Drug Use: No  . Sexual Activity: No   Other Topics Concern  . Not on file   Social History Narrative  Lives with son and daughter in law    Family History  Problem Relation Age of Onset  . Cancer Mother     breast  . Stroke Father     ROS- unable to provide due to dementia  Physical Exam: Filed Vitals:   07/17/13 1123  BP: 146/71  Pulse: 70  Height: 5\' 2"  (1.575 m)  Weight: 118 lb 1.9 oz (53.579 kg)    GEN- The patient is elderly appearing, alert  Head- normocephalic, atraumatic Eyes-  Sclera clear, conjunctiva pink Ears- hearing intact Oropharynx- clear Neck- supple, no JVP Lymph- no cervical lymphadenopathy Lungs- Clear to ausculation bilaterally, normal work of breathing Heart- Regular rate and rhythm, no murmurs, rubs or gallops, PMI not laterally displaced GI- soft, NT, ND, + BS Extremities- no clubbing, cyanosis, or edema MS- no significant deformity or atrophy Skin- no rash or  lesion Psych- euthymic mood, full affect Neuro- strength and sensation are intact  EKG 07/11/13 reveals sinus rhythm 84 bpm, PR 208 msec, otherwise normal ekg  Assessment and Plan:  1. Recurrent unexplained syncope EKG is reviewed and is benign.  Metabolic cause is possible and also seizures.  She has worn an event monitor to evaluate for arrhythmias however due to the infrequent nature of episodes she did not having syncope while wearing the monitor. I agree with Dr Donnie Aho that implantable loop recorder is a very good option to further evaluate for EP cause of her syncope. Risks, benefits, and alternatives to implantable loop recorder were discussed at length with the patient and her daughter-in law today.  They understand risks and are willing to proceed.  We will therefore proceed with implantable loop recorder implant at the next available time.

## 2013-07-17 NOTE — Procedures (Signed)
SURGEON:  Hillis Range, MD     PREPROCEDURE DIAGNOSIS:  Recurrent unexplained syncope    POSTPROCEDURE DIAGNOSIS:  Recurrent unexplained syncope     PROCEDURES:   1. Implantable loop recorder implantation    INTRODUCTION:  Alicia [name] is a 79 y.o. female with a history of recurrent unexplained syncope who presents today for implantable loop implantation.   Despite an extensive workup by Dr Donnie Aho and neurology, no reversible causes have been identified.  she has worn an event monitor during which she did not have arrhythmias but also did not have syncope.The patient therefore presents today for implantable loop implantation.     DESCRIPTION OF PROCEDURE:  Informed written consent was obtained, and the patient was brought to the electrophysiology lab in a fasting state.  The patient required no sedation for the procedure today.  Mapping over the patient's chest was performed by the EP lab staff to identify the area where electrograms were most prominent for ILR recording.  This area was found to be the left parasternal region over the 3rd-4th intercostal space. The patients left chest was therefore prepped and draped in the usual sterile fashion by the EP lab staff. The skin overlying the left parasternal region was infiltrated with lidocaine for local analgesia.  A 0.5-cm incision was made over the left parasternal region over the 3rd intercostal space.  A subcutaneous ILR pocket was fashioned using a combination of sharp and blunt dissection.  A Medtronic Reveal Dodson model [id number] implantable loop recorder was then placed into the pocket  R waves were very prominent and measured 0.58mV.  Steri- Strips and a sterile dressing were then applied.  There were no early apparent complications.     CONCLUSIONS:   1. Successful implantation of a Medtronic Reveal LINQ implantable loop recorder for recurrent unexplained syncope  2. No early apparent complications.

## 2013-07-17 NOTE — H&P (View-Only) (Signed)
Primary Care Physician: Barden, Lucy P, PA-C Referring Physician:  Dr Tilley   Alicia [name] is a 79 y.o. female with a h/o dementia and recurrent syncope who presents for EP consultation.  The patient has difficulty providing history and history is therefore per Dr Tilley's notes and pts daughter in law. The patient has had 4 episodes of syncope over the past year or so.  No cause has been determined.  Her daughter in law has observed at least one episode.  She reports that the patient becomes progressively somnolent and then loses consciousness for a few seconds.  She then has a brief (1-2 seconds) period of spastic shaking and returns to her baseline.  She has worn an event monitor without having syncope.  Due to infrequent nature, a cause has not been able to be determined.  Today, she denies symptoms of palpitations, chest pain, shortness of breath, orthopnea, PND, lower extremity edema, or neurologic sequela. The patient is tolerating medications without difficulties and is otherwise without complaint today.   Past Medical History  Diagnosis Date  . Dementia     significant  . Renal disease   . Memory loss 12/10/2012  . Depression 12/10/2012  . Anxiety state, unspecified 12/10/2012  . Spells 12/10/2012  . Essential hypertension, benign 12/10/2012  . Other and unspecified hyperlipidemia 12/10/2012  . Loss of weight 12/10/2012  . Carotid artery stenosis   . Ovarian cancer     treated with surgery & chemo  . Ankle fracture, right   . Unstable gait     mild  . Syncope 12/10/2012    Recurrent, sporadic episodes  . Carotid artery disease    Past Surgical History  Procedure Laterality Date  . Vesicovaginal fistula closure w/ tah  2005  . Oophorectomy  2007    for ovarian cancer  . Prolapsed uterine fibroid ligation    . Appendectomy    . Cataract extraction Bilateral     Current Outpatient Prescriptions  Medication Sig Dispense Refill  . aspirin EC 81 MG tablet Take 81 mg by  mouth daily.      . atorvastatin (LIPITOR) 20 MG tablet Take 20 mg by mouth daily.      . calcium carbonate 200 MG capsule Take 600 mg by mouth daily. Take at night      . Multiple Vitamin (MULTIVITAMIN WITH MINERALS) TABS Take 1 tablet by mouth daily.      . oxybutynin (DITROPAN-XL) 10 MG 24 hr tablet Take 10 mg by mouth daily.      . sertraline (ZOLOFT) 50 MG tablet Take 50 mg by mouth daily.       No current facility-administered medications for this visit.    Allergies  Allergen Reactions  . Iohexol Rash  . Ciprofloxacin Other (See Comments)    weakness  . Nsaids     Renal insufficiency  . Sulfa Antibiotics Hives and Itching  . Ivp Dye [Iodinated Diagnostic Agents] Rash    History   Social History  . Marital Status: Married    Spouse Name: N/A    Number of Children: N/A  . Years of Education: N/A   Occupational History  . Not on file.   Social History Main Topics  . Smoking status: Never Smoker   . Smokeless tobacco: Not on file  . Alcohol Use: No  . Drug Use: No  . Sexual Activity: No   Other Topics Concern  . Not on file   Social History Narrative     Lives with son and daughter in law    Family History  Problem Relation Age of Onset  . Cancer Mother     breast  . Stroke Father     ROS- unable to provide due to dementia  Physical Exam: Filed Vitals:   07/17/13 1123  BP: 146/71  Pulse: 70  Height: 5' 2" (1.575 m)  Weight: 118 lb 1.9 oz (53.579 kg)    GEN- The patient is elderly appearing, alert  Head- normocephalic, atraumatic Eyes-  Sclera clear, conjunctiva pink Ears- hearing intact Oropharynx- clear Neck- supple, no JVP Lymph- no cervical lymphadenopathy Lungs- Clear to ausculation bilaterally, normal work of breathing Heart- Regular rate and rhythm, no murmurs, rubs or gallops, PMI not laterally displaced GI- soft, NT, ND, + BS Extremities- no clubbing, cyanosis, or edema MS- no significant deformity or atrophy Skin- no rash or  lesion Psych- euthymic mood, full affect Neuro- strength and sensation are intact  EKG 07/11/13 reveals sinus rhythm 84 bpm, PR 208 msec, otherwise normal ekg  Assessment and Plan:  1. Recurrent unexplained syncope EKG is reviewed and is benign.  Metabolic cause is possible and also seizures.  She has worn an event monitor to evaluate for arrhythmias however due to the infrequent nature of episodes she did not having syncope while wearing the monitor. I agree with Dr Tilley that implantable loop recorder is a very good option to further evaluate for EP cause of her syncope. Risks, benefits, and alternatives to implantable loop recorder were discussed at length with the patient and her daughter-in law today.  They understand risks and are willing to proceed.  We will therefore proceed with implantable loop recorder implant at the next available time. 

## 2013-07-17 NOTE — Interval H&P Note (Signed)
History and Physical Interval Note:  07/17/2013 2:39 PM  Alicia [name]  has presented today for surgery, with the diagnosis of Syncope  The various methods of treatment have been discussed with the patient and family. After consideration of risks, benefits and other options for treatment, the patient has consented to  Procedure(s): LOOP RECORDER IMPLANT (N/A) as a surgical intervention .  The patient's history has been reviewed, patient examined, no change in status, stable for surgery.  I have reviewed the patient's chart and labs.  Questions were answered to the patient's satisfaction.     Hillis Range

## 2013-07-17 NOTE — Patient Instructions (Signed)
LINQ monitor today  Check in at the [facility] Entrance at 1pm--

## 2013-07-24 ENCOUNTER — Telehealth: Payer: Self-pay | Admitting: Internal Medicine

## 2013-07-24 NOTE — Telephone Encounter (Signed)
New problem   Pt's daughter in law need to know if pt need to sched a f/u up appt. Please call

## 2013-07-24 NOTE — Telephone Encounter (Signed)
Scheduled for wound check on 07-25-13 @ 1100 for wound check/kwm

## 2013-07-25 ENCOUNTER — Ambulatory Visit (INDEPENDENT_AMBULATORY_CARE_PROVIDER_SITE_OTHER): Payer: Medicare Other | Admitting: *Deleted

## 2013-07-25 DIAGNOSIS — R55 Syncope and collapse: Secondary | ICD-10-CM

## 2013-07-25 LAB — MDC_IDC_ENUM_SESS_TYPE_INCLINIC: Zone Setting Detection Interval: 420 ms

## 2013-07-25 NOTE — Progress Notes (Signed)
ILR wound check in office.

## 2013-08-15 ENCOUNTER — Encounter: Payer: Self-pay | Admitting: Internal Medicine

## 2013-08-16 ENCOUNTER — Other Ambulatory Visit (HOSPITAL_COMMUNITY): Payer: Self-pay | Admitting: Physician Assistant

## 2013-08-16 DIAGNOSIS — Z1231 Encounter for screening mammogram for malignant neoplasm of breast: Secondary | ICD-10-CM

## 2013-08-19 ENCOUNTER — Telehealth: Payer: Self-pay | Admitting: Internal Medicine

## 2013-08-19 NOTE — Telephone Encounter (Signed)
New message  Pt daughter called states that the pt was experiencig SOB// per Dr. Jenel Lucks instructions previously, daughter placed the monitor to the pt's chest and sent a signal..Please call back with the transmission results.Marland KitchenMarland Kitchen

## 2013-08-19 NOTE — Telephone Encounter (Signed)
Spoke w/ pt's daughter-in-law, Fannie Knee. Let her know Dr. York Spaniel office has pt assigned to their clinic for home monitoring. Fannie Knee is requesting I clarify w/ Dr. Johney Frame who will follow pt's [facility]. I will contact Dr. York Spaniel office in the morning.   Per Fannie Knee, pulse was irregularly at 178bpm---pulse would jump to 178 then dip into 80s, then bounce back to 178.  ...did not finish conversation--Sue's cell phone died x2.

## 2013-08-19 NOTE — Telephone Encounter (Signed)
LMOM @ 6:58pm. Pt's device, per [facility], is followed by Dr. [facility] Spaniel clinic.

## 2013-08-20 NOTE — Telephone Encounter (Signed)
Confirmed Dr. Donnie Aho is following remote monitoring of pt's loop recorder. Fannie Knee, daughter-in-law, aware.

## 2013-08-30 ENCOUNTER — Ambulatory Visit (HOSPITAL_COMMUNITY): Payer: Medicare Other

## 2013-09-19 ENCOUNTER — Ambulatory Visit (HOSPITAL_COMMUNITY): Payer: Medicare Other

## 2013-10-02 ENCOUNTER — Telehealth: Payer: Self-pay | Admitting: Neurology

## 2013-10-02 NOTE — Telephone Encounter (Signed)
Patient's daughter calling to give some information to [employer] regarding the insurance paperwork they dropped off this morning. Patient's daughter states that for the contract number, it is [id number]. If there are questions, please call her back.

## 2013-10-02 NOTE — Telephone Encounter (Signed)
I called daughter. She will pick up forms and sign release

## 2013-10-21 ENCOUNTER — Ambulatory Visit: Payer: Medicare Other | Admitting: Neurology

## 2013-10-23 ENCOUNTER — Encounter: Payer: Self-pay | Admitting: Internal Medicine

## 2013-10-24 ENCOUNTER — Encounter: Payer: Self-pay | Admitting: Internal Medicine

## 2013-10-24 ENCOUNTER — Ambulatory Visit (INDEPENDENT_AMBULATORY_CARE_PROVIDER_SITE_OTHER): Payer: Medicare Other | Admitting: Internal Medicine

## 2013-10-24 VITALS — BP 192/84 | HR 71 | Ht 62.0 in | Wt 123.5 lb

## 2013-10-24 DIAGNOSIS — I10 Essential (primary) hypertension: Secondary | ICD-10-CM

## 2013-10-24 DIAGNOSIS — R55 Syncope and collapse: Secondary | ICD-10-CM

## 2013-10-24 DIAGNOSIS — R001 Bradycardia, unspecified: Secondary | ICD-10-CM

## 2013-10-24 DIAGNOSIS — I498 Other specified cardiac arrhythmias: Secondary | ICD-10-CM

## 2013-10-24 LAB — MDC_IDC_ENUM_SESS_TYPE_INCLINIC
MDC IDC SESS DTM: 20150212042519
MDC IDC SET ZONE DETECTION INTERVAL: 2000 ms
MDC IDC SET ZONE DETECTION INTERVAL: 420 ms
Zone Setting Detection Interval: 3000 ms

## 2013-10-24 MED ORDER — AMLODIPINE BESYLATE 2.5 MG PO TABS: 2.5000 mg | ORAL_TABLET | Freq: Every day | ORAL | Status: DC

## 2013-10-24 NOTE — Progress Notes (Signed)
PCP: Shellia Carwin, PA-C   Primary Cardiologist:  Dr Merryl Hacker is a 80 y.o. female who presents today for routine electrophysiology followup.  Since last being seen in our clinic, the patient reports doing very well.  She has had no further syncope.  Today, she denies symptoms of palpitations, chest pain, shortness of breath,  lower extremity edema, dizziness, or other concerns.  The patient is otherwise without complaint today.   Past Medical History  Diagnosis Date  . Dementia     significant  . Renal disease   . Memory loss 12/10/2012  . Depression 12/10/2012  . Anxiety state, unspecified 12/10/2012  . Spells 12/10/2012  . Essential hypertension, benign 12/10/2012  . Other and unspecified hyperlipidemia 12/10/2012  . Loss of weight 12/10/2012  . Carotid artery stenosis   . Ovarian cancer     treated with surgery & chemo  . Ankle fracture, right   . Unstable gait     mild  . Syncope 12/10/2012    Recurrent, sporadic episodes  . Carotid artery disease    Past Surgical History  Procedure Laterality Date  . Vesicovaginal fistula closure w/ tah  2005  . Oophorectomy  2007    for ovarian cancer  . Prolapsed uterine fibroid ligation    . Appendectomy    . Cataract extraction Bilateral   . Implantable loop recorder  11/14    MDT LINQ implanted for recurrent unexplained syncope by Dr Rayann Heman    Current Outpatient Prescriptions  Medication Sig Dispense Refill  . aspirin EC 81 MG tablet Take 81 mg by mouth daily.      Marland Kitchen atorvastatin (LIPITOR) 20 MG tablet Take 20 mg by mouth daily.      . calcium carbonate 200 MG capsule Take 600 mg by mouth daily. Take at night      . Multiple Vitamin (MULTIVITAMIN WITH MINERALS) TABS Take 1 tablet by mouth daily.      Marland Kitchen oxybutynin (DITROPAN-XL) 10 MG 24 hr tablet Take 10 mg by mouth daily.      . sertraline (ZOLOFT) 50 MG tablet Take 50 mg by mouth daily.       No current facility-administered medications for this visit.     Physical Exam: Filed Vitals:   10/24/13 1117  BP: 192/84  Pulse: 71  Height: 5\' 2"  (1.575 m)  Weight: 123 lb 8 oz (56.019 kg)    GEN- The patient is well appearing, alert and oriented x 3 today.   Head- normocephalic, atraumatic Eyes-  Sclera clear, conjunctiva pink Ears- hearing intact Oropharynx- clear Lungs- Clear to ausculation bilaterally, normal work of breathing Heart- Regular rate and rhythm, no murmurs, rubs or gallops, PMI not laterally displaced GI- soft, NT, ND, + BS Extremities- no clubbing, cyanosis, or edema  Implantable loop recorder reviewed (see paceart)  Assessment and Plan:  1. Syncope No further syncope No arrhythmias by LINQ today, rare pvcs, normal histogram spread  2. HTN Elevated BP  I have spoken with Dr Wynonia Lawman.  We both agree with conservative management I will start amlodipine 2.5mg  daily today.  She will call Dr Wynonia Lawman to arrange follow-up  Return as needed

## 2013-10-24 NOTE — Patient Instructions (Addendum)
Continue remote follow up checks with Dr.Tilley. Continue recording symptoms as they arise.  Your physician recommends that you schedule a follow-up appointment as needed.   Your physician has recommended you make the following change in your medication:  1) Start Amlodipine 2.5mg  daily

## 2013-11-11 ENCOUNTER — Ambulatory Visit (INDEPENDENT_AMBULATORY_CARE_PROVIDER_SITE_OTHER): Payer: Medicare Other | Admitting: Neurology

## 2013-11-11 ENCOUNTER — Encounter: Payer: Self-pay | Admitting: Neurology

## 2013-11-11 VITALS — BP 128/68 | HR 85 | Temp 98.1°F | Ht 62.0 in | Wt 123.0 lb

## 2013-11-11 DIAGNOSIS — F0390 Unspecified dementia without behavioral disturbance: Secondary | ICD-10-CM

## 2013-11-11 DIAGNOSIS — R55 Syncope and collapse: Secondary | ICD-10-CM

## 2013-11-11 DIAGNOSIS — I498 Other specified cardiac arrhythmias: Secondary | ICD-10-CM

## 2013-11-11 DIAGNOSIS — R001 Bradycardia, unspecified: Secondary | ICD-10-CM

## 2013-11-11 MED ORDER — MEMANTINE HCL ER 7 MG PO CP24: 7.0000 mg | ORAL_CAPSULE | Freq: Every day | ORAL | Status: DC

## 2013-11-11 NOTE — Patient Instructions (Addendum)
When you are feeling better after your cough, we will try you on Namenda XR, starting at 7 mg once daily with gradual build up. Side effects include: nausea, confusion, hallucination, personality changes. If you are having mild side effects, try to stick with the treatment as these initial side effects may go away after the first 10-14 days.     You can fill a 30 day trial offer and take 7 mg daily for 3 weeks, then take 2 pills daily for 1 week (14 mg total), then one week of 14 mg daily (part of titration pack) and continue with the titration.

## 2013-11-11 NOTE — Progress Notes (Signed)
Subjective:    Patient ID: Alicia [name] is a 80 y.o. female.  HPI     Interim history:   Alicia [name] is a very pleasant 80 year old right-handed woman with an underlying medical history of cancer, tremor, depression, anxiety, hyperlipidemia, syncope, weight loss and memory loss, who presents for followup consultation of her memory loss and intermittent confusion. She is accompanied by her daughter-in-law, [name], again today. I last saw her on 06/21/2013, at which time we lowered the Aricept again to 5 mg, due to concern for bradycardia. I also suggested we consider Namenda XR down the road. At the time, her daughter in-law reported no recent issues with confusion or pre-syncope. She had low BP one night and she had low K and she was not given her Aricept and her lisinopril. She had also been sleep walking or had some confusion: She left the house one day in the early morning and they had to call 911 and she was found walking on a neighboring street and sat down in a neighbor's garage. She has needed more prompting with simple tasks. They had an aid daily from 11 AM to 2 PM, but the aid quit on the day of her appointment in 10/14.  In the interim, her daughter called on 07/11/2013 reporting a syncopal spell at home. We had her stop the Aricept. In November 2014 she had an implantable loop monitor placed under Dr. Rayann Heman, with monitoring through Dr. Wynonia Lawman. She was seen in followup by Dr. Rayann Heman on 10/24/2013. She was started on low-dose amlodipine.  Today, [name] reports a significant decline in patient's cognitive function, more misplacing things, more confusion, 2 recent episodes of bedwetting at night. She had a fever 2 days ago and was taken to UC yesterday and was started on a Z pack. They have an aid daily from 11 AM to up to 4 PM. She has never been on Namenda. She has been sleeping more and eating a lot more. She has gained weight. She wanders in the house.   I saw her on 03/12/2013, at  which time I increased her Aricept from 5 mg to 10 mg again. I first met her on 12/10/2012 and she previously followed with Dr. Morene Antu. MRI brain in June 2011 showed chronic microvascular ischemia and generalized atrophy, RPR and B12 were normal in June 2011. She was started on Aricept but discontinued the medication and then re-started it. She was admitted to [facility] in 3/14 for an episode of unresponsiveness. At the time of her first visit I suggested an EEG as well as cardiology evaluation. I did not make any changes in her medications. Her EEG was normal in the awake state. She had a 30 day Holter, which was normal. She saw Dr. Tollie Eth. He lowered her metoprolol to 25 mg, and she had discontinued her amlodipine per PCP previously. Her Aricept had been decreased in the past d/t concern of bradycardia.  The patient has had some AH and rare VH. They seem to be transformational. She has been sleeping fairly well, but at times she wanders at night but is usually easily redirected.   Her Past Medical History Is Significant For: Past Medical History  Diagnosis Date  . Dementia     significant  . Renal disease   . Memory loss 12/10/2012  . Depression 12/10/2012  . Anxiety state, unspecified 12/10/2012  . Spells 12/10/2012  . Essential hypertension, benign 12/10/2012  . Other and unspecified hyperlipidemia 12/10/2012  .  Loss of weight 12/10/2012  . Carotid artery stenosis   . Ovarian cancer     treated with surgery & chemo  . Ankle fracture, right   . Unstable gait     mild  . Syncope 12/10/2012    Recurrent, sporadic episodes  . Carotid artery disease     Her Past Surgical History Is Significant For: Past Surgical History  Procedure Laterality Date  . Vesicovaginal fistula closure w/ tah  2005  . Oophorectomy  2007    for ovarian cancer  . Prolapsed uterine fibroid ligation    . Appendectomy    . Cataract extraction Bilateral   . Implantable loop recorder   11/14    MDT LINQ implanted for recurrent unexplained syncope by Dr Rayann Heman    Her Family History Is Significant For: Family History  Problem Relation Age of Onset  . Cancer Mother     breast  . Stroke Father     Her Social History Is Significant For: History   Social History  . Marital Status: Married    Spouse Name: N/A    Number of Children: N/A  . Years of Education: N/A   Social History Main Topics  . Smoking status: Never Smoker   . Smokeless tobacco: Not on file  . Alcohol Use: No  . Drug Use: No  . Sexual Activity: No   Other Topics Concern  . Not on file   Social History Narrative   Lives with son and daughter in law    Her Allergies Are:  Allergies  Allergen Reactions  . Iohexol Rash  . Ciprofloxacin Other (See Comments)    weakness  . Nsaids     Renal insufficiency  . Sulfa Antibiotics Hives and Itching  . Ivp Dye [Iodinated Diagnostic Agents] Rash  :   Her Current Medications Are:  Outpatient Encounter Prescriptions as of 11/11/2013  Medication Sig  . amLODipine (NORVASC) 2.5 MG tablet Take 1 tablet (2.5 mg total) by mouth daily.  Marland Kitchen aspirin EC 81 MG tablet Take 81 mg by mouth daily.  Marland Kitchen atorvastatin (LIPITOR) 20 MG tablet Take 20 mg by mouth daily.  Marland Kitchen azithromycin (ZITHROMAX) 250 MG tablet Take 1 tablet by mouth daily.  . calcium carbonate 200 MG capsule Take 600 mg by mouth daily. Take at night  . Multiple Vitamin (MULTIVITAMIN WITH MINERALS) TABS Take 1 tablet by mouth daily.  Marland Kitchen oxybutynin (DITROPAN-XL) 10 MG 24 hr tablet Take 10 mg by mouth daily.  . sertraline (ZOLOFT) 50 MG tablet Take 50 mg by mouth daily.  :  Review of Systems:  Out of a complete 14 point review of systems, all are reviewed and negative with the exception of these symptoms as listed below:  Review of Systems  Constitutional: Positive for activity change.  HENT: Positive for ear pain.   Eyes: Negative.   Respiratory: Positive for cough, shortness of breath and wheezing.    Cardiovascular: Negative.   Gastrointestinal: Negative.   Endocrine: Negative.   Genitourinary: Positive for enuresis.  Musculoskeletal: Positive for gait problem.  Skin: Negative.   Allergic/Immunologic: Negative.   Neurological:       Memory loss  Hematological: Negative.   Psychiatric/Behavioral: Negative.     Objective:  Neurologic Exam  Physical Exam Physical Examination:   Filed Vitals:   11/11/13 1208  BP: 128/68  Pulse: 85  Temp: 98.1 F (36.7 C)    General Examination: The patient is a very pleasant 80 y.o. female in no acute  distress. She is calm and cooperative with the exam. She denies Auditory Hallucinations and Visual Hallucinations.   HEENT: Normocephalic, atraumatic, pupils are equal, round and reactive to light and accommodation. Extraocular tracking shows moderate saccadic breakdown without nystagmus noted. Hearing is impaired. Face is symmetric with mild facial masking and normal facial sensation. There is a mild to moderate intermittent lip, and lower jaw tremor. Neck is supple rigid with intact passive ROM. There are no carotid bruits on auscultation. Oropharynx exam reveals no mouth dryness. No significant airway crowding is noted. Mallampati is class II. Tongue protrudes centrally and palate elevates symmetrically.    Chest: is clear to auscultation without wheezing, rhonchi or crackles noted.  Heart: sounds are regular and normal without murmurs, rubs or gallops noted.   Abdomen: is soft, non-tender and non-distended with normal bowel sounds appreciated on auscultation.  Extremities: There is no pitting edema in the distal lower extremities bilaterally. Pedal pulses are intact.  Skin: is warm and dry with no trophic changes noted.  Musculoskeletal: exam reveals no obvious joint deformities, tenderness or joint swelling or erythema.  Neurologically:  Mental status: The patient is awake and alert, paying very little attention. She is unable to  provide much in the way of history. She is oriented to: person, place, day of week, not month of year or year. Her memory, attention, language and knowledge are impaired. There is no aphasia, agnosia, apraxia or anomia. There is a mild degree of bradyphrenia. Speech is mildly hypophonic with no dysarthria noted. Mood is congruent and affect is normal.   MMSE: did not do today, as she has recently been ill and not currently at her baseline.    Cranial nerves are as described above under HEENT exam. In addition, shoulder shrug is normal with equal shoulder height noted.  Motor exam: Normal bulk, and strength for age is noted. Tone is not rigid with absence of cogwheeling in the extremities. There is overall mild bradykinesia. There is no drift or rebound. Romberg is negative. Reflexes are 2+ in the upper extremities and 2+ in the lower extremities. Fine motor skills: Finger taps, hand movements, and rapid alternating patting are mildly impaired bilaterally. Foot taps and foot agility are mildly impaired bilaterally. She has an intermittent hand tremor.   Cerebellar testing shows no dysmetria or intention tremor on finger to nose testing. There is no truncal or gait ataxia.   Sensory exam is intact to light touch, pinprick, vibration, temperature sense.   Gait, station and balance: She stands up from the seated position with mild difficulty and needs to push herself up. No veering to one side is noted. No leaning to one side. Posture is mildly stooped. Stance is wide-based. She turns in 3 steps. Tandem walk is not possible. Balance is mildly impaired.   Assessment and Plan:    In summary, CORTEZ STEELMAN is a very pleasant 80 year old female with a history of advanced dementia without behavioral disturbance and hx of syncopal and pre-syncopal spells, low BP and low heart rate, recent VH and AH and sleep walking. She has been more sleepy and has been eating more, and has had more confusion and  cognitive decline. This may be all in the context of progression of her dementia. She has been tried on Aricept for more than one occasion and could not tolerate it. She is off of that now. [name] believes that there has been a decline in her memory since then. This may be a phenomenon  of being off of the medication but also a phenomenon of progression. It will be hard to tease out. Nevertheless, it to structure, I do not believe we need to try her on Aricept again. In fact I would like to stay away from anticholinergic type memory medicines at this point. I had a long chat with the patient and her daughter-in-law about my findings and the diagnosis of advanced dementia, its prognosis and treatment options. We talked about medical treatments and non-pharmacological approaches. We talked about maintaining a healthy lifestyle in general. Thankfully, the patient has a great support system and wonderful family that helps take care of her. She has been able to stay with her family. I would like for her to start Namenda XR 7 mg daily, and I provided them with a low free one-month trial card as well as a titration pack. Essentially, she will stay on Namenda XR 7 mg daily for about 3 weeks, then on 14 mg daily for about 2 weeks and then follow the titration pack to up to 28 mg once daily over the next weeks. We talked about potential side effects and I gave him instructions in writing. I will see her back in about 3 months, sooner if the need arises and encouraged her daughter-in-law to call with any interim questions, concerns, problems or updates or refill requests.

## 2013-11-30 DIAGNOSIS — C569 Malignant neoplasm of unspecified ovary: Secondary | ICD-10-CM | POA: Insufficient documentation

## 2013-11-30 DIAGNOSIS — M810 Age-related osteoporosis without current pathological fracture: Secondary | ICD-10-CM | POA: Insufficient documentation

## 2013-11-30 DIAGNOSIS — N184 Chronic kidney disease, stage 4 (severe): Secondary | ICD-10-CM | POA: Diagnosis present

## 2013-12-18 ENCOUNTER — Inpatient Hospital Stay (HOSPITAL_COMMUNITY)
Admission: EM | Admit: 2013-12-18 | Discharge: 2013-12-25 | DRG: 179 | Disposition: A | Discharge status: Skilled Nursing Facility | Payer: Medicare Other | Attending: Internal Medicine | Admitting: Internal Medicine

## 2013-12-18 ENCOUNTER — Emergency Department (HOSPITAL_COMMUNITY): Payer: Medicare Other

## 2013-12-18 ENCOUNTER — Encounter (HOSPITAL_COMMUNITY): Payer: Self-pay | Admitting: Emergency Medicine

## 2013-12-18 DIAGNOSIS — F329 Major depressive disorder, single episode, unspecified: Secondary | ICD-10-CM

## 2013-12-18 DIAGNOSIS — J189 Pneumonia, unspecified organism: Secondary | ICD-10-CM | POA: Diagnosis present

## 2013-12-18 DIAGNOSIS — E785 Hyperlipidemia, unspecified: Secondary | ICD-10-CM | POA: Diagnosis present

## 2013-12-18 DIAGNOSIS — F32A Depression, unspecified: Secondary | ICD-10-CM

## 2013-12-18 DIAGNOSIS — N189 Chronic kidney disease, unspecified: Secondary | ICD-10-CM | POA: Diagnosis present

## 2013-12-18 DIAGNOSIS — Z888 Allergy status to other drugs, medicaments and biological substances status: Secondary | ICD-10-CM

## 2013-12-18 DIAGNOSIS — Z7982 Long term (current) use of aspirin: Secondary | ICD-10-CM

## 2013-12-18 DIAGNOSIS — I129 Hypertensive chronic kidney disease with stage 1 through stage 4 chronic kidney disease, or unspecified chronic kidney disease: Secondary | ICD-10-CM | POA: Diagnosis present

## 2013-12-18 DIAGNOSIS — R413 Other amnesia: Secondary | ICD-10-CM

## 2013-12-18 DIAGNOSIS — R112 Nausea with vomiting, unspecified: Secondary | ICD-10-CM | POA: Diagnosis present

## 2013-12-18 DIAGNOSIS — Z8543 Personal history of malignant neoplasm of ovary: Secondary | ICD-10-CM

## 2013-12-18 DIAGNOSIS — F3289 Other specified depressive episodes: Secondary | ICD-10-CM | POA: Diagnosis present

## 2013-12-18 DIAGNOSIS — I498 Other specified cardiac arrhythmias: Secondary | ICD-10-CM | POA: Diagnosis present

## 2013-12-18 DIAGNOSIS — R0902 Hypoxemia: Secondary | ICD-10-CM | POA: Diagnosis present

## 2013-12-18 DIAGNOSIS — Z79899 Other long term (current) drug therapy: Secondary | ICD-10-CM

## 2013-12-18 DIAGNOSIS — I10 Essential (primary) hypertension: Secondary | ICD-10-CM | POA: Diagnosis present

## 2013-12-18 DIAGNOSIS — F0390 Unspecified dementia without behavioral disturbance: Secondary | ICD-10-CM | POA: Diagnosis present

## 2013-12-18 DIAGNOSIS — Z66 Do not resuscitate: Secondary | ICD-10-CM | POA: Diagnosis present

## 2013-12-18 DIAGNOSIS — J690 Pneumonitis due to inhalation of food and vomit: Principal | ICD-10-CM | POA: Diagnosis present

## 2013-12-18 DIAGNOSIS — Z881 Allergy status to other antibiotic agents status: Secondary | ICD-10-CM

## 2013-12-18 DIAGNOSIS — F411 Generalized anxiety disorder: Secondary | ICD-10-CM | POA: Diagnosis present

## 2013-12-18 DIAGNOSIS — I6529 Occlusion and stenosis of unspecified carotid artery: Secondary | ICD-10-CM | POA: Diagnosis present

## 2013-12-18 LAB — CBC WITH DIFFERENTIAL/PLATELET
BASOS ABS: 0 10*3/uL (ref 0.0–0.1)
BASOS PCT: 0 % (ref 0–1)
EOS ABS: 0 10*3/uL (ref 0.0–0.7)
Eosinophils Relative: 0 % (ref 0–5)
HCT: 36.2 % (ref 36.0–46.0)
Hemoglobin: 12.2 g/dL (ref 12.0–15.0)
LYMPHS ABS: 0.2 10*3/uL — AB (ref 0.7–4.0)
Lymphocytes Relative: 4 % — ABNORMAL LOW (ref 12–46)
MCH: 31.4 pg (ref 26.0–34.0)
MCHC: 33.7 g/dL (ref 30.0–36.0)
MCV: 93.1 fL (ref 78.0–100.0)
Monocytes Absolute: 0.2 10*3/uL (ref 0.1–1.0)
Monocytes Relative: 3 % (ref 3–12)
NEUTROS PCT: 93 % — AB (ref 43–77)
Neutro Abs: 4.7 10*3/uL (ref 1.7–7.7)
PLATELETS: 155 10*3/uL (ref 150–400)
RBC: 3.89 MIL/uL (ref 3.87–5.11)
RDW: 14.5 % (ref 11.5–15.5)
WBC: 5.1 10*3/uL (ref 4.0–10.5)

## 2013-12-18 LAB — LIPASE, BLOOD: Lipase: 19 U/L (ref 11–59)

## 2013-12-18 LAB — COMPREHENSIVE METABOLIC PANEL
ALBUMIN: 3.1 g/dL — AB (ref 3.5–5.2)
ALK PHOS: 59 U/L (ref 39–117)
ALT: 25 U/L (ref 0–35)
AST: 36 U/L (ref 0–37)
BUN: 30 mg/dL — ABNORMAL HIGH (ref 6–23)
CALCIUM: 8.5 mg/dL (ref 8.4–10.5)
CO2: 21 mEq/L (ref 19–32)
Chloride: 99 mEq/L (ref 96–112)
Creatinine, Ser: 1.06 mg/dL (ref 0.50–1.10)
GFR calc Af Amer: 53 mL/min — ABNORMAL LOW (ref >90)
GFR calc non Af Amer: 45 mL/min — ABNORMAL LOW (ref >90)
Glucose, Bld: 199 mg/dL — ABNORMAL HIGH (ref 70–99)
POTASSIUM: 3.6 meq/L — AB (ref 3.7–5.3)
SODIUM: 139 meq/L (ref 137–147)
TOTAL PROTEIN: 6 g/dL (ref 6.0–8.3)
Total Bilirubin: 0.4 mg/dL (ref 0.3–1.2)

## 2013-12-18 MED ORDER — SODIUM CHLORIDE 0.9 % IV SOLN
INTRAVENOUS | Status: DC
  Administered 2013-12-18: 23:00:00 via INTRAVENOUS

## 2013-12-18 NOTE — ED Notes (Signed)
Pt comes from [facility] via EMS for N/V/D. Per staff pt has had multiple episodes of vomiting and diarrhea, EMS reports not vomiting in route. Pt has hx of Alzheimers, Dementia, Type 2 Diabetes. Last set of vitals 162/72, 106 pulse, 145 CBG, 18 resp and 93% 2L.

## 2013-12-18 NOTE — ED Notes (Signed)
The patient is unable to give an urine specimen at this time. The tech has reported to the RN in charge. 

## 2013-12-19 ENCOUNTER — Encounter (HOSPITAL_COMMUNITY): Payer: Self-pay | Admitting: Internal Medicine

## 2013-12-19 ENCOUNTER — Inpatient Hospital Stay (HOSPITAL_COMMUNITY): Payer: Medicare Other

## 2013-12-19 DIAGNOSIS — F3289 Other specified depressive episodes: Secondary | ICD-10-CM

## 2013-12-19 DIAGNOSIS — R413 Other amnesia: Secondary | ICD-10-CM

## 2013-12-19 DIAGNOSIS — J189 Pneumonia, unspecified organism: Secondary | ICD-10-CM | POA: Diagnosis present

## 2013-12-19 DIAGNOSIS — I10 Essential (primary) hypertension: Secondary | ICD-10-CM

## 2013-12-19 DIAGNOSIS — E785 Hyperlipidemia, unspecified: Secondary | ICD-10-CM | POA: Diagnosis present

## 2013-12-19 DIAGNOSIS — R112 Nausea with vomiting, unspecified: Secondary | ICD-10-CM

## 2013-12-19 DIAGNOSIS — F329 Major depressive disorder, single episode, unspecified: Secondary | ICD-10-CM

## 2013-12-19 LAB — COMPREHENSIVE METABOLIC PANEL
ALBUMIN: 2.6 g/dL — AB (ref 3.5–5.2)
ALT: 25 U/L (ref 0–35)
AST: 30 U/L (ref 0–37)
Alkaline Phosphatase: 72 U/L (ref 39–117)
BILIRUBIN TOTAL: 0.3 mg/dL (ref 0.3–1.2)
BUN: 27 mg/dL — ABNORMAL HIGH (ref 6–23)
CALCIUM: 8.3 mg/dL — AB (ref 8.4–10.5)
CHLORIDE: 102 meq/L (ref 96–112)
CO2: 24 mEq/L (ref 19–32)
CREATININE: 1.16 mg/dL — AB (ref 0.50–1.10)
GFR calc Af Amer: 47 mL/min — ABNORMAL LOW (ref >90)
GFR calc non Af Amer: 41 mL/min — ABNORMAL LOW (ref >90)
Glucose, Bld: 80 mg/dL (ref 70–99)
Potassium: 3.9 mEq/L (ref 3.7–5.3)
Sodium: 140 mEq/L (ref 137–147)
Total Protein: 5.5 g/dL — ABNORMAL LOW (ref 6.0–8.3)

## 2013-12-19 LAB — URINALYSIS, ROUTINE W REFLEX MICROSCOPIC
BILIRUBIN URINE: NEGATIVE
Glucose, UA: 100 mg/dL — AB
HGB URINE DIPSTICK: NEGATIVE
Ketones, ur: 15 mg/dL — AB
Leukocytes, UA: NEGATIVE
Nitrite: NEGATIVE
PH: 5.5 (ref 5.0–8.0)
Protein, ur: 30 mg/dL — AB
SPECIFIC GRAVITY, URINE: 1.024 (ref 1.005–1.030)
Urobilinogen, UA: 0.2 mg/dL (ref 0.0–1.0)

## 2013-12-19 LAB — MRSA PCR SCREENING: MRSA by PCR: NEGATIVE

## 2013-12-19 LAB — CBC WITH DIFFERENTIAL/PLATELET
Basophils Absolute: 0 10*3/uL (ref 0.0–0.1)
Basophils Relative: 0 % (ref 0–1)
EOS ABS: 0 10*3/uL (ref 0.0–0.7)
EOS PCT: 0 % (ref 0–5)
HCT: 33.9 % — ABNORMAL LOW (ref 36.0–46.0)
HEMOGLOBIN: 11.2 g/dL — AB (ref 12.0–15.0)
Lymphocytes Relative: 10 % — ABNORMAL LOW (ref 12–46)
Lymphs Abs: 0.8 10*3/uL (ref 0.7–4.0)
MCH: 30.9 pg (ref 26.0–34.0)
MCHC: 33 g/dL (ref 30.0–36.0)
MCV: 93.6 fL (ref 78.0–100.0)
MONOS PCT: 6 % (ref 3–12)
Monocytes Absolute: 0.4 10*3/uL (ref 0.1–1.0)
Neutro Abs: 6.6 10*3/uL (ref 1.7–7.7)
Neutrophils Relative %: 84 % — ABNORMAL HIGH (ref 43–77)
Platelets: 155 10*3/uL (ref 150–400)
RBC: 3.62 MIL/uL — AB (ref 3.87–5.11)
RDW: 14.8 % (ref 11.5–15.5)
WBC: 7.9 10*3/uL (ref 4.0–10.5)

## 2013-12-19 LAB — URINE MICROSCOPIC-ADD ON

## 2013-12-19 LAB — INFLUENZA PANEL BY PCR (TYPE A & B)
H1N1 flu by pcr: NOT DETECTED
INFLAPCR: NEGATIVE
Influenza B By PCR: NEGATIVE

## 2013-12-19 MED ORDER — CALCIUM CARBONATE 200 MG PO CAPS: 600.0000 mg | ORAL_CAPSULE | Freq: Every day | ORAL | Status: DC

## 2013-12-19 MED ORDER — CALCIUM CARBONATE 1250 (500 CA) MG PO TABS
1.0000 | ORAL_TABLET | Freq: Every day | ORAL | Status: DC
  Administered 2013-12-19 – 2013-12-25 (×7): 500 mg via ORAL
  Filled 2013-12-19 (×9): qty 1

## 2013-12-19 MED ORDER — SODIUM CHLORIDE 0.9 % IV SOLN
INTRAVENOUS | Status: AC
  Administered 2013-12-19 – 2013-12-20 (×2): via INTRAVENOUS

## 2013-12-19 MED ORDER — OXYBUTYNIN CHLORIDE ER 10 MG PO TB24
10.0000 mg | ORAL_TABLET | Freq: Every day | ORAL | Status: DC
  Administered 2013-12-19 – 2013-12-25 (×7): 10 mg via ORAL
  Filled 2013-12-19 (×7): qty 1

## 2013-12-19 MED ORDER — ONDANSETRON HCL 4 MG PO TABS: 4.0000 mg | ORAL_TABLET | Freq: Four times a day (QID) | ORAL | Status: DC | PRN

## 2013-12-19 MED ORDER — MEMANTINE HCL ER 7 MG PO CP24
7.0000 mg | ORAL_CAPSULE | Freq: Every day | ORAL | Status: DC
  Administered 2013-12-19 – 2013-12-25 (×7): 7 mg via ORAL
  Filled 2013-12-19 (×7): qty 1

## 2013-12-19 MED ORDER — ATORVASTATIN CALCIUM 20 MG PO TABS
20.0000 mg | ORAL_TABLET | Freq: Every day | ORAL | Status: DC
  Administered 2013-12-19 – 2013-12-25 (×7): 20 mg via ORAL
  Filled 2013-12-19 (×7): qty 1

## 2013-12-19 MED ORDER — VANCOMYCIN HCL IN DEXTROSE 750-5 MG/150ML-% IV SOLN
750.0000 mg | INTRAVENOUS | Status: DC
  Administered 2013-12-20 – 2013-12-21 (×3): 750 mg via INTRAVENOUS
  Filled 2013-12-19 (×4): qty 150

## 2013-12-19 MED ORDER — ACETAMINOPHEN 325 MG PO TABS
650.0000 mg | ORAL_TABLET | Freq: Once | ORAL | Status: AC
  Administered 2013-12-19: 650 mg via ORAL
  Filled 2013-12-19: qty 2

## 2013-12-19 MED ORDER — PIPERACILLIN-TAZOBACTAM 3.375 G IVPB
3.3750 g | Freq: Once | INTRAVENOUS | Status: AC
  Administered 2013-12-19: 3.375 g via INTRAVENOUS
  Filled 2013-12-19: qty 50

## 2013-12-19 MED ORDER — AMLODIPINE BESYLATE 2.5 MG PO TABS
2.5000 mg | ORAL_TABLET | Freq: Every day | ORAL | Status: DC
  Administered 2013-12-19 – 2013-12-20 (×2): 2.5 mg via ORAL
  Filled 2013-12-19 (×2): qty 1

## 2013-12-19 MED ORDER — HYDRALAZINE HCL 20 MG/ML IJ SOLN
5.0000 mg | INTRAMUSCULAR | Status: DC | PRN
  Administered 2013-12-20 – 2013-12-24 (×4): 5 mg via INTRAVENOUS
  Filled 2013-12-19 (×6): qty 1

## 2013-12-19 MED ORDER — POTASSIUM CHLORIDE 10 MEQ/100ML IV SOLN
10.0000 meq | Freq: Once | INTRAVENOUS | Status: AC
  Administered 2013-12-19: 10 meq via INTRAVENOUS
  Filled 2013-12-19: qty 100

## 2013-12-19 MED ORDER — ENOXAPARIN SODIUM 40 MG/0.4ML ~~LOC~~ SOLN
40.0000 mg | SUBCUTANEOUS | Status: DC
  Administered 2013-12-19: 40 mg via SUBCUTANEOUS
  Filled 2013-12-19 (×2): qty 0.4

## 2013-12-19 MED ORDER — ACETAMINOPHEN 650 MG RE SUPP: 650.0000 mg | Freq: Four times a day (QID) | RECTAL | Status: DC | PRN

## 2013-12-19 MED ORDER — MEMANTINE HCL ER 7 MG PO CP24: 7.0000 mg | ORAL_CAPSULE | Freq: Every day | ORAL | Status: DC

## 2013-12-19 MED ORDER — ACETAMINOPHEN 325 MG PO TABS
650.0000 mg | ORAL_TABLET | Freq: Four times a day (QID) | ORAL | Status: DC | PRN
  Administered 2013-12-21 – 2013-12-24 (×2): 650 mg via ORAL
  Filled 2013-12-19 (×2): qty 2

## 2013-12-19 MED ORDER — ADULT MULTIVITAMIN W/MINERALS CH
1.0000 | ORAL_TABLET | Freq: Every day | ORAL | Status: DC
  Administered 2013-12-19 – 2013-12-25 (×7): 1 via ORAL
  Filled 2013-12-19 (×7): qty 1

## 2013-12-19 MED ORDER — ONDANSETRON HCL 4 MG/2ML IJ SOLN: 4.0000 mg | Freq: Four times a day (QID) | INTRAMUSCULAR | Status: DC | PRN

## 2013-12-19 MED ORDER — PIPERACILLIN-TAZOBACTAM 3.375 G IVPB
3.3750 g | Freq: Three times a day (TID) | INTRAVENOUS | Status: DC
  Administered 2013-12-19 – 2013-12-23 (×11): 3.375 g via INTRAVENOUS
  Filled 2013-12-19 (×14): qty 50

## 2013-12-19 MED ORDER — ASPIRIN EC 81 MG PO TBEC
81.0000 mg | DELAYED_RELEASE_TABLET | Freq: Every day | ORAL | Status: DC
  Administered 2013-12-19 – 2013-12-25 (×7): 81 mg via ORAL
  Filled 2013-12-19 (×7): qty 1

## 2013-12-19 MED ORDER — VANCOMYCIN HCL IN DEXTROSE 1-5 GM/200ML-% IV SOLN
1000.0000 mg | Freq: Once | INTRAVENOUS | Status: AC
  Administered 2013-12-19: 1000 mg via INTRAVENOUS
  Filled 2013-12-19: qty 200

## 2013-12-19 MED ORDER — SERTRALINE HCL 50 MG PO TABS
50.0000 mg | ORAL_TABLET | Freq: Every day | ORAL | Status: DC
  Administered 2013-12-19 – 2013-12-25 (×7): 50 mg via ORAL
  Filled 2013-12-19 (×7): qty 1

## 2013-12-19 NOTE — Progress Notes (Signed)
ANTIBIOTIC CONSULT NOTE - INITIAL  Pharmacy Consult for vancomycin and zosyn Indication: pneumonia  Allergies  Allergen Reactions  . Iohexol Rash  . Ciprofloxacin Other (See Comments)    weakness  . Nsaids     Renal insufficiency  . Sulfa Antibiotics Hives and Itching  . Ivp Dye [Iodinated Diagnostic Agents] Rash    Patient Measurements: Height: 5\' 3"  (160 cm) Weight: 129 lb 6.4 oz (58.695 kg) IBW/kg (Calculated) : 52.4  Vital Signs: Temp: 98.3 F (36.8 C) (04/09 1420) Temp src: Oral (04/09 1420) BP: 159/65 mmHg (04/09 1420) Pulse Rate: 74 (04/09 1420) Labs:  Recent Labs  12/18/13 2241  WBC 5.1  HGB 12.2  PLT 155  CREATININE 1.06   Microbiology: No results found for this or any previous visit (from the past 720 hour(s)).  Medical History: Past Medical History  Diagnosis Date  . Dementia     significant  . Renal disease   . Memory loss 12/10/2012  . Depression 12/10/2012  . Anxiety state, unspecified 12/10/2012  . Spells 12/10/2012  . Essential hypertension, benign 12/10/2012  . Other and unspecified hyperlipidemia 12/10/2012  . Loss of weight 12/10/2012  . Carotid artery stenosis   . Ovarian cancer     treated with surgery & chemo  . Ankle fracture, right   . Unstable gait     mild  . Syncope 12/10/2012    Recurrent, sporadic episodes  . Carotid artery disease     Medications:  Prescriptions prior to admission  Medication Sig Dispense Refill  . amLODipine (NORVASC) 2.5 MG tablet Take 1 tablet (2.5 mg total) by mouth daily.  180 tablet  3  . aspirin EC 81 MG tablet Take 81 mg by mouth daily.      Marland Kitchen atorvastatin (LIPITOR) 20 MG tablet Take 20 mg by mouth daily.      Marland Kitchen azithromycin (ZITHROMAX) 250 MG tablet Take 1 tablet by mouth daily.      . calcium carbonate 200 MG capsule Take 600 mg by mouth daily. Take at night      . Memantine HCl ER (NAMENDA XR) 7 MG CP24 Take 1 capsule (7 mg total) by mouth daily. To be used with free 30 day trial offer.  30  capsule  0  . Multiple Vitamin (MULTIVITAMIN WITH MINERALS) TABS Take 1 tablet by mouth daily.      Marland Kitchen oxybutynin (DITROPAN-XL) 10 MG 24 hr tablet Take 10 mg by mouth daily.      . sertraline (ZOLOFT) 50 MG tablet Take 50 mg by mouth daily.       Assessment: 80 year old female presents from nursing home with n/v/d, xray with concern for pneumonia.  Pt currently afebrile, but had Tmax of 101.3 overnight.    Goal of Therapy:  Resolution of infection  Plan:  Zosyn 3.375 g IV q8h Vancomycin 750 mg IV q24h Vanc trough as needed F/u cultures and renal function  Hughes Better, PharmD, BCPS Clinical Pharmacist Pager: [phone] 12/19/2013 3:53 PM

## 2013-12-19 NOTE — H&P (Signed)
[facility] History and Physical  Alicia [name] ZOX:[id number] DOB: [birth year] DOA: 12/18/2013  Referring physician: ER physician. PCP: Shellia Carwin, PA-C   History obtained from patient's daughter and the ER physician and previous records.  Chief Complaint: Nausea vomiting  HPI: Alicia [name] is a 80 y.o. female with history of dementia, hypertension, hyperlipidemia, chronic kidney disease, history of ovarian cancer status post surgery was brought to the ER after patient was found to have multiple episodes of nausea and vomiting. As per the daughter patient did not have diarrhea. In the ER patient was found to be febrile with temperatures around 101.80F. Chest x-ray shows infiltrates concerning for pneumonia. Patient has been admitted for further workup. Patient is a somnolent but arousable denies any pain. Abdominal exam is benign. Patient was just placed in the facility on Friday as patient's daughter was traveling. She usually stays with her daughter at her house. Patient has history of recurrent syncope for which patient has loop recorder and is being followed by Dr. Wynonia Lawman and Dr. Rayann Heman, cardiologist. Patient was recently started on Namenda few weeks ago after discontinuing Aricept due to bradycardia.  Review of Systems: As presented in the history of presenting illness, rest negative.  Past Medical History  Diagnosis Date  . Dementia     significant  . Renal disease   . Memory loss 12/10/2012  . Depression 12/10/2012  . Anxiety state, unspecified 12/10/2012  . Spells 12/10/2012  . Essential hypertension, benign 12/10/2012  . Other and unspecified hyperlipidemia 12/10/2012  . Loss of weight 12/10/2012  . Carotid artery stenosis   . Ovarian cancer     treated with surgery & chemo  . Ankle fracture, right   . Unstable gait     mild  . Syncope 12/10/2012    Recurrent, sporadic episodes  . Carotid artery disease    Past Surgical History  Procedure Laterality Date   . Vesicovaginal fistula closure w/ tah  2005  . Oophorectomy  2007    for ovarian cancer  . Prolapsed uterine fibroid ligation    . Appendectomy    . Cataract extraction Bilateral   . Implantable loop recorder  11/14    MDT LINQ implanted for recurrent unexplained syncope by Dr Rayann Heman  . Abdominal hysterectomy     Social History:  reports that she has never smoked. She does not have any smokeless tobacco history on file. She reports that she does not drink alcohol or use illicit drugs. Where does patient live in usually stays with her daughter at her house. Can patient participate in ADLs? Not sure.  Allergies  Allergen Reactions  . Iohexol Rash  . Ciprofloxacin Other (See Comments)    weakness  . Nsaids     Renal insufficiency  . Sulfa Antibiotics Hives and Itching  . Ivp Dye [Iodinated Diagnostic Agents] Rash    Family History:  Family History  Problem Relation Age of Onset  . Cancer Mother     breast  . Stroke Father       Prior to Admission medications   Medication Sig Start Date End Date Taking? Authorizing Provider  amLODipine (NORVASC) 2.5 MG tablet Take 1 tablet (2.5 mg total) by mouth daily. 10/24/13  Yes Thompson Grayer, MD  aspirin EC 81 MG tablet Take 81 mg by mouth daily.   Yes Historical Provider, MD  atorvastatin (LIPITOR) 20 MG tablet Take 20 mg by mouth daily.   Yes Historical Provider, MD  azithromycin (ZITHROMAX) 250 MG tablet  Take 1 tablet by mouth daily. 11/10/13  Yes Historical Provider, MD  calcium carbonate 200 MG capsule Take 600 mg by mouth daily. Take at night   Yes Historical Provider, MD  Memantine HCl ER (NAMENDA XR) 7 MG CP24 Take 1 capsule (7 mg total) by mouth daily. To be used with free 30 day trial offer. 11/11/13  Yes Star Age, MD  Multiple Vitamin (MULTIVITAMIN WITH MINERALS) TABS Take 1 tablet by mouth daily.   Yes Historical Provider, MD  oxybutynin (DITROPAN-XL) 10 MG 24 hr tablet Take 10 mg by mouth daily.   Yes Historical Provider, MD   sertraline (ZOLOFT) 50 MG tablet Take 50 mg by mouth daily.   Yes Historical Provider, MD    Physical Exam: Filed Vitals:   12/19/13 0004 12/19/13 0200 12/19/13 0230 12/19/13 0300  BP: 133/68 118/64 123/62 120/59  Pulse: 97 96 95 91  Temp: 101.3 F (38.5 C)     TempSrc: Rectal     Resp: 21 23 15 21   SpO2: 98% 98% 98% 97%     General:  Well-developed moderately nourished.  Eyes: Anicteric no pallor.  ENT: No discharge from the ears eyes nose or mouth.  Neck: No mass felt.  Cardiovascular: S1-S2 heard.  Respiratory: No rhonchi or crepitations.  Abdomen: Soft nontender bowel sounds present. No guarding or rigidity.  Skin: No rash.  Musculoskeletal: No edema.  Psychiatric: Patient is somnolent.  Neurologic: Patient is somnolent and follows commands.  Labs on Admission:  Basic Metabolic Panel:  Recent Labs Lab 12/18/13 2241  NA 139  K 3.6*  CL 99  CO2 21  GLUCOSE 199*  BUN 30*  CREATININE 1.06  CALCIUM 8.5   Liver Function Tests:  Recent Labs Lab 12/18/13 2241  AST 36  ALT 25  ALKPHOS 59  BILITOT 0.4  PROT 6.0  ALBUMIN 3.1*    Recent Labs Lab 12/18/13 2241  LIPASE 19   No results found for this basename: AMMONIA,  in the last 168 hours CBC:  Recent Labs Lab 12/18/13 2241  WBC 5.1  NEUTROABS 4.7  HGB 12.2  HCT 36.2  MCV 93.1  PLT 155   Cardiac Enzymes: No results found for this basename: CKTOTAL, CKMB, CKMBINDEX, TROPONINI,  in the last 168 hours  BNP (last 3 results) No results found for this basename: PROBNP,  in the last 8760 hours CBG: No results found for this basename: GLUCAP,  in the last 168 hours  Radiological Exams on Admission: Dg Chest 2 View  12/19/2013   CLINICAL DATA:  Emesis, shortness of breath and diarrhea.  EXAM: CHEST  2 VIEW  COMPARISON:  07/18/2007 and 12/14/2009  FINDINGS: Lungs are hypoinflated demonstrate bibasilar airspace opacification left worse than right likely infection. Possible small amount left  pleural fluid. Cardiomediastinal silhouette and remainder of the exam is unchanged.  IMPRESSION: Bibasilar airspace process left worse than right likely infection. Possible small amount of left pleural fluid.   Electronically Signed   By: Marin Olp M.D.   On: 12/19/2013 00:15     Assessment/Plan Principal Problem:   Pneumonia Active Problems:   Memory loss   Essential hypertension, benign   Nausea & vomiting   HLD (hyperlipidemia)   1. Pneumonia - suspect aspiration pneumonia. Patient has been started on vancomycin and Zosyn which we will continue. Check influenza PCR. 2. Nausea vomiting - not sure if patient had diarrhea. Abdominal x-ray does not show any obstruction. Suspect could be viral gastroenteritis. If nausea vomiting persist  may need CT abdomen. If patient has diarrhea check stool studies. For now I have placed patient on clear liquid diet. May advance as tolerated. 3. Hypertension - continue home medications. 4. Hyperlipidemia - continue present medications. 5. Dementia - on Namenda. 6. History of chronic kidney disease - closely follow intake output and metabolic panel. 7. Hyperglycemia - check hemoglobin A1c. 8. History of ovarian cancer status post surgery. 9. History of recurrent syncope - presently on loop recorder. Being followed by Dr. Wynonia Lawman cardiologist.    Code Status: DO NOT RESUSCITATE.  Family Communication: Patient's daughter.  Disposition Plan: Admit to inpatient.    [facility] Pager [phone].  If 7PM-7AM, please contact night-coverage [email or web address] Password [email or web address] 12/19/2013, 3:29 AM

## 2013-12-19 NOTE — Progress Notes (Signed)
Received pt assignment at 1335. Called report from ED at 1350. Report completed at 1356. Awaiting pt arrival to unit.

## 2013-12-19 NOTE — Progress Notes (Signed)
Informed Tylene Fantasia, NP of several situations with patient:  Family reported no diarrhea at SNF, only vomitting.  Family requested daily miralax in order for pt to have regular stool.  Pt did give [place] a small stool sample and so sample was sent for Cdiff.  Family requested rectal temp due to oral temp usually being inaccurate at home.  Current temperature per rectum was 99.7.  BP is also elevated to 179/73 HR 99.  Pt does not appear to be in pain.  Family requested RN notify family when rectal temp was obtained.  Informed Gregor Hams, daughter-in-law via phone.  Awaiting orders from [employer].  Pt resting comfortably at this time.  Will continue to monitor.  Iantha Fallen RN 11:10 PM 12/19/2013

## 2013-12-19 NOTE — Progress Notes (Signed)
I have seen and assessed patient and agree with Dr Kakrakandy's assessment and plan. 

## 2013-12-19 NOTE — Progress Notes (Signed)
Pt admitted to the unit at 1413. Pt mental status is A&O to self only. Pt oriented to room, staff, and call bell. Skin is intact. Full assessment to be charted in [facility]. Call bell within reach. Visitor guidelines reviewed w/ pt and/or family.

## 2013-12-19 NOTE — ED Notes (Signed)
Family at bedside requesting information regarding pt. Pt family and son updated on pt care, awaiting a room upstairs.

## 2013-12-19 NOTE — ED Provider Notes (Signed)
CSN: [id number]     Arrival date & time 12/18/13  2216 History   First MD Initiated Contact with Patient 12/18/13 2306     Chief Complaint  Patient presents with  . Emesis  . Diarrhea     (Consider location/radiation/quality/duration/timing/severity/associated sxs/prior Treatment) HPI History provided by EMS and nursing home report. History of diabetes, dementia, renal disease. EMS called tonight for multiple bouts of vomiting and diarrhea. EMS reports no emesis in route. Patient hypoxic and tachycardic, placed on oxygen and requiring 2 L O2 by nasal cannula to keep pulse ox over 90%.  In emergency department, patient denies any pain or specific complaints. She appears to have significant dementia and is not a reliable historian. No family bedside. Level V caveat applies.  Past Medical History  Diagnosis Date  . Dementia     significant  . Renal disease   . Memory loss 12/10/2012  . Depression 12/10/2012  . Anxiety state, unspecified 12/10/2012  . Spells 12/10/2012  . Essential hypertension, benign 12/10/2012  . Other and unspecified hyperlipidemia 12/10/2012  . Loss of weight 12/10/2012  . Carotid artery stenosis   . Ovarian cancer     treated with surgery & chemo  . Ankle fracture, right   . Unstable gait     mild  . Syncope 12/10/2012    Recurrent, sporadic episodes  . Carotid artery disease    Past Surgical History  Procedure Laterality Date  . Vesicovaginal fistula closure w/ tah  2005  . Oophorectomy  2007    for ovarian cancer  . Prolapsed uterine fibroid ligation    . Appendectomy    . Cataract extraction Bilateral   . Implantable loop recorder  11/14    MDT LINQ implanted for recurrent unexplained syncope by Dr Rayann Heman   Family History  Problem Relation Age of Onset  . Cancer Mother     breast  . Stroke Father    History  Substance Use Topics  . Smoking status: Never Smoker   . Smokeless tobacco: Not on file  . Alcohol Use: No   OB History   Grav Para Term  Preterm Abortions TAB SAB Ect Mult Living                 Review of Systems  Unable to perform ROS  level 5 caveat for severe dementia    Allergies  Iohexol; Ciprofloxacin; Nsaids; Sulfa antibiotics; and Ivp dye  Home Medications   Current Outpatient Rx  Name  Route  Sig  Dispense  Refill  . amLODipine (NORVASC) 2.5 MG tablet   Oral   Take 1 tablet (2.5 mg total) by mouth daily.   180 tablet   3   . aspirin EC 81 MG tablet   Oral   Take 81 mg by mouth daily.         Marland Kitchen atorvastatin (LIPITOR) 20 MG tablet   Oral   Take 20 mg by mouth daily.         Marland Kitchen azithromycin (ZITHROMAX) 250 MG tablet   Oral   Take 1 tablet by mouth daily.         . calcium carbonate 200 MG capsule   Oral   Take 600 mg by mouth daily. Take at night         . Memantine HCl ER (NAMENDA XR) 7 MG CP24   Oral   Take 1 capsule (7 mg total) by mouth daily. To be used with free 30 day trial offer.  30 capsule   0   . Multiple Vitamin (MULTIVITAMIN WITH MINERALS) TABS   Oral   Take 1 tablet by mouth daily.         Marland Kitchen oxybutynin (DITROPAN-XL) 10 MG 24 hr tablet   Oral   Take 10 mg by mouth daily.         . sertraline (ZOLOFT) 50 MG tablet   Oral   Take 50 mg by mouth daily.          BP 133/68  Pulse 97  Temp(Src) 101.3 F (38.5 C) (Rectal)  Resp 21  SpO2 98% Physical Exam  Constitutional: She appears well-developed and well-nourished.  HENT:  Head: Normocephalic and atraumatic.  Mildly dry mucous membranes  Eyes: EOM are normal. Pupils are equal, round, and reactive to light.  Neck: Neck supple.  Cardiovascular: Regular rhythm and intact distal pulses.   Tachycardic  Pulmonary/Chest: Effort normal. No stridor. No respiratory distress. She exhibits no tenderness.  Mildly coarse bilateral breath sounds without respiratory distress  Abdominal: Soft. Bowel sounds are normal. She exhibits no distension. There is no tenderness. There is no rebound and no guarding.   Musculoskeletal: Normal range of motion. She exhibits no edema and no tenderness.  Neurological: She is alert.  Oriented to self, moving all extremities, no facial droop  Skin: Skin is warm and dry.    ED Course  Procedures (including critical care time) Labs Review Labs Reviewed  CBC WITH DIFFERENTIAL - Abnormal; Notable for the following:    Neutrophils Relative % 93 (*)    Lymphocytes Relative 4 (*)    Lymphs Abs 0.2 (*)    All other components within normal limits  COMPREHENSIVE METABOLIC PANEL - Abnormal; Notable for the following:    Potassium 3.6 (*)    Glucose, Bld 199 (*)    BUN 30 (*)    Albumin 3.1 (*)    GFR calc non Af Amer 45 (*)    GFR calc Af Amer 53 (*)    All other components within normal limits  URINE CULTURE  LIPASE, BLOOD  URINALYSIS, ROUTINE W REFLEX MICROSCOPIC   Imaging Review Dg Chest 2 View  12/19/2013   CLINICAL DATA:  Emesis, shortness of breath and diarrhea.  EXAM: CHEST  2 VIEW  COMPARISON:  07/18/2007 and 12/14/2009  FINDINGS: Lungs are hypoinflated demonstrate bibasilar airspace opacification left worse than right likely infection. Possible small amount left pleural fluid. Cardiomediastinal silhouette and remainder of the exam is unchanged.  IMPRESSION: Bibasilar airspace process left worse than right likely infection. Possible small amount of left pleural fluid.   Electronically Signed   By: Marin Olp M.D.   On: 12/19/2013 00:15    IV fluids. IV antibiotics. Tylenol for fever. IV potassium for mild hypokalemia. Discussed with Dr. Hal Hope, plan admit med/ surg  MDM   Diagnosis: HCAP, vomiting diarrhea  Presenting from nursing facility with vomiting and diarrhea found to be febrile and hypoxic. Chest x-ray reviewed as above and IV antibiotics initiated to cover for healthcare associated pneumonia. Labs reviewed as above and medicine consult for admission - new oxygen requirement.  MED admit    Teressa Lower, MD 12/19/13 [phone]

## 2013-12-19 NOTE — ED Notes (Signed)
[place] (daughter in law)

## 2013-12-20 DIAGNOSIS — E785 Hyperlipidemia, unspecified: Secondary | ICD-10-CM

## 2013-12-20 LAB — CBC
HCT: 32.6 % — ABNORMAL LOW (ref 36.0–46.0)
Hemoglobin: 11.1 g/dL — ABNORMAL LOW (ref 12.0–15.0)
MCH: 31.5 pg (ref 26.0–34.0)
MCHC: 34 g/dL (ref 30.0–36.0)
MCV: 92.6 fL (ref 78.0–100.0)
PLATELETS: 145 10*3/uL — AB (ref 150–400)
RBC: 3.52 MIL/uL — ABNORMAL LOW (ref 3.87–5.11)
RDW: 14.9 % (ref 11.5–15.5)
WBC: 6.3 10*3/uL (ref 4.0–10.5)

## 2013-12-20 LAB — BASIC METABOLIC PANEL
BUN: 19 mg/dL (ref 6–23)
CALCIUM: 7.9 mg/dL — AB (ref 8.4–10.5)
CO2: 24 meq/L (ref 19–32)
CREATININE: 1.24 mg/dL — AB (ref 0.50–1.10)
Chloride: 106 mEq/L (ref 96–112)
GFR calc Af Amer: 44 mL/min — ABNORMAL LOW (ref >90)
GFR calc non Af Amer: 38 mL/min — ABNORMAL LOW (ref >90)
GLUCOSE: 89 mg/dL (ref 70–99)
Potassium: 3.3 mEq/L — ABNORMAL LOW (ref 3.7–5.3)
Sodium: 143 mEq/L (ref 137–147)

## 2013-12-20 LAB — URINE CULTURE
COLONY COUNT: NO GROWTH
CULTURE: NO GROWTH

## 2013-12-20 LAB — CLOSTRIDIUM DIFFICILE BY PCR: CDIFFPCR: NEGATIVE

## 2013-12-20 LAB — HEMOGLOBIN A1C
Hgb A1c MFr Bld: 6.1 % — ABNORMAL HIGH (ref <5.7)
MEAN PLASMA GLUCOSE: 128 mg/dL — AB (ref <117)

## 2013-12-20 MED ORDER — AMLODIPINE BESYLATE 5 MG PO TABS
5.0000 mg | ORAL_TABLET | Freq: Every day | ORAL | Status: DC
  Administered 2013-12-21 – 2013-12-24 (×4): 5 mg via ORAL
  Filled 2013-12-20 (×5): qty 1

## 2013-12-20 MED ORDER — POTASSIUM CHLORIDE CRYS ER 20 MEQ PO TBCR
40.0000 meq | EXTENDED_RELEASE_TABLET | ORAL | Status: AC
  Administered 2013-12-20 (×2): 40 meq via ORAL
  Filled 2013-12-20 (×2): qty 2

## 2013-12-20 MED ORDER — ENOXAPARIN SODIUM 30 MG/0.3ML ~~LOC~~ SOLN
30.0000 mg | SUBCUTANEOUS | Status: DC
  Administered 2013-12-21 – 2013-12-24 (×4): 30 mg via SUBCUTANEOUS
  Filled 2013-12-20 (×5): qty 0.3

## 2013-12-20 MED ORDER — AMLODIPINE BESYLATE 2.5 MG PO TABS
2.5000 mg | ORAL_TABLET | Freq: Once | ORAL | Status: AC
  Administered 2013-12-20: 2.5 mg via ORAL
  Filled 2013-12-20: qty 1

## 2013-12-20 NOTE — Evaluation (Signed)
Physical Therapy Evaluation Patient Details Name: Alicia [name] MRN: [id number] DOB: 1922-11-07 Today's Date: 12/20/2013   History of Present Illness   Alicia [name] is a 80 y.o. female with history of dementia, hypertension, hyperlipidemia, chronic kidney disease, history of ovarian cancer status post surgery was brought to the ER after patient was found to have multiple episodes of nausea and vomiting. As per the daughter patient did not have diarrhea. In the ER patient was found to be febrile with temperatures around 101.22F. Chest x-ray shows infiltrates concerning for pneumonia. Patient has been admitted for further workup. Patient is a somnolent but arousable denies any pain. Abdominal exam is benign. Patient was just placed in the facility on Friday as patient's daughter was traveling. She usually stays with her daughter at her house. Patient has history of recurrent syncope for which patient has loop recorder and is being followed by Dr. Wynonia Lawman and Dr. Rayann Heman, cardiologist. Patient was recently started on Namenda few weeks ago after discontinuing Aricept due to bradycardia.  Clinical Impression  Pt admitted with PNA. Pt currently with functional limitations due to the deficits listed below (see PT Problem List).  Pt will benefit from skilled PT to increase their independence and safety with mobility to allow discharge to the venue listed below.      Follow Up Recommendations SNF    Equipment Recommendations  Rolling walker with 5" wheels    Recommendations for Other Services       Precautions / Restrictions Precautions Precautions: Fall Restrictions Weight Bearing Restrictions: No      Mobility  Bed Mobility Overal bed mobility: Modified Independent             General bed mobility comments: use of rail and HOB elevated  Transfers Overall transfer level: Needs assistance Equipment used: Rolling walker (2 wheeled) Transfers: Sit to/from Stand Sit to Stand:  Min guard         General transfer comment: cues for safe technique  Ambulation/Gait Ambulation/Gait assistance: Min assist Ambulation Distance (Feet): 150 Feet Assistive device: Rolling walker (2 wheeled);None (15' without device) Gait Pattern/deviations: WFL(Within Functional Limits) Gait velocity: decreased Gait velocity interpretation: Below normal speed for age/gender General Gait Details: pt. steady with RW needing min guard A.  Amb 15' without device with increased unsteadiness and LOB x 2 needing min A to correct  Stairs            Wheelchair Mobility    Modified Rankin (Stroke Patients Only)       Balance Overall balance assessment: Needs assistance Sitting-balance support: No upper extremity supported;Feet supported Sitting balance-Leahy Scale: Good     Standing balance support: No upper extremity supported;During functional activity Standing balance-Leahy Scale: Poor Standing balance comment: LOB with static standing; min A to correct                             Pertinent Vitals/Pain No acute distress    Home Living Family/patient expects to be discharged to:: Skilled nursing facility                      Prior Function Level of Independence: Needs assistance   Gait / Transfers Assistance Needed: unknown as pt. not cognitively intact and no family present; pt. reports independent and no use of AD for mobility           Hand Dominance        Extremity/Trunk Assessment  Upper Extremity Assessment: Generalized weakness           Lower Extremity Assessment: Generalized weakness      Cervical / Trunk Assessment: Kyphotic  Communication   Communication: HOH  Cognition Arousal/Alertness: Awake/alert Behavior During Therapy: WFL for tasks assessed/performed Overall Cognitive Status: No family/caregiver present to determine baseline cognitive functioning       Memory: Decreased short-term memory;Decreased recall  of precautions              General Comments General comments (skin integrity, edema, etc.): pt. overall tolerated session well; recommend RW for d/c    Exercises        Assessment/Plan    PT Assessment Patient needs continued PT services  PT Diagnosis Difficulty walking;Generalized weakness   PT Problem List Decreased strength;Decreased range of motion;Decreased activity tolerance;Decreased balance;Decreased mobility;Decreased cognition;Decreased knowledge of use of DME;Decreased safety awareness;Decreased knowledge of precautions  PT Treatment Interventions DME instruction;Gait training;Functional mobility training;Therapeutic activities;Therapeutic exercise;Balance training;Neuromuscular re-education;Patient/family education;Cognitive remediation   PT Goals (Current goals can be found in the Care Plan section) Acute Rehab PT Goals Patient Stated Goal: unable to state PT Goal Formulation: Patient unable to participate in goal setting Time For Goal Achievement: 01/03/14 Potential to Achieve Goals: Good    Frequency Min 3X/week   Barriers to discharge Other (comment) per MD notes pt. from SNF; recommend return to SNF at d/c    Co-evaluation               End of Session Equipment Utilized During Treatment: Gait belt Activity Tolerance: Patient tolerated treatment well Patient left: in chair;with chair alarm set;with call bell/phone within reach Nurse Communication: Mobility status         Time: 0956-1020 PT Time Calculation (min): 24 min   Charges:   PT Evaluation $Initial PT Evaluation Tier I: 1 Procedure PT Treatments $Gait Training: 8-22 mins   PT G Codes:          Laureen Abrahams 12/20/2013, 10:32 AM  Laureen Abrahams, PT, DPT [phone]

## 2013-12-20 NOTE — Progress Notes (Signed)
Soap suds enema given as ordered, pt. Tolerated well and had medium soft brown stool.  Will continue to monitor.  Alphonzo Lemmings, RN

## 2013-12-20 NOTE — Clinical Social Work Psychosocial (Signed)
Clinical Social Work Department BRIEF PSYCHOSOCIAL ASSESSMENT 12/20/2013  Patient:  Alicia [name], Alicia [name]     Account Number:  [id number]     Admit date:  12/18/2013  Clinical Social Worker:  Lovey Newcomer  Date/Time:  12/20/2013 03:00 PM  Referred by:  Physician  Date Referred:  12/20/2013 Referred for  SNF Placement   Other Referral:   Interview type:  Family Other interview type:   CSW interviewed patient's son and daughter inlaw by phone as no family is at bedside and patient is not oriented at this time.    PSYCHOSOCIAL DATA Living Status:  FAMILY Admitted from facility:   Level of care:   Primary support name:  Ollen Gross and [name] Primary support relationship to patient:  FAMILY Degree of support available:   Support is strong.    CURRENT CONCERNS Current Concerns  Post-Acute Placement   Other Concerns:    SOCIAL WORK ASSESSMENT / PLAN CSW spoke with patient's son and daughter inlaw by phone to complete assessment. Patient is pleasantly confused at this time. Ollen Gross and [name] state that the patient is from home with them where [name] provides 24/7 supervision to both the patient and the patient's husband. CSW explained that PT has recommended that the patient go to SNF prior to returning home. Ollen Gross and [name] are in agreement with this recommendation. [name] states, "I'm with her all day and she is really independent [at baseline], so if the therapist is recommending it then we definitely want her to get rehab before returning home." Ollen Gross and [name] have preference for [facility], [place], or [facility] and state that they will be very selective about which facility she goes to. CSW explained that a bed at any of these cannot be guaranteed but all facilities would be a sent a referral. Family understands this. Ollen Gross and [name] are optimistic that the patient will be able to return home soon. CSW answered their questions.   Assessment/plan status:  Psychosocial Support/Ongoing  Assessment of Needs Other assessment/ plan:   Complete FL2, Fax, PASRR   Information/referral to community resources:   CSW contact information and SNF list given to family.    PATIENT'S/FAMILY'S RESPONSE TO PLAN OF CARE: Patient son and daughter in law plan for patient to DC to SNF when ready. Ollen Gross and [name] were pleasant, appropriate, and appreciative of CSW contact. CSW will assist with DC when ready.       Liz Beach MSW, [place], [place], [phone]

## 2013-12-20 NOTE — Progress Notes (Signed)
Utilization review completed.  

## 2013-12-20 NOTE — Progress Notes (Signed)
[facility] PROGRESS NOTE  Alicia [name] VOJ:[id number] DOB: 1922-10-01 DOA: 12/18/2013 PCP: Shellia Carwin, PA-C  Assessment/Plan: #1 probable aspiration pneumonia versus community-acquired pneumonia Per chest x-ray. Patient with clinical improvement. Patient afebrile. Influenza PCR is negative. Continue empiric IV vancomycin IV Zosyn. Follow.  #2 nausea/ vomiting Improved. Abdominal x-ray negative for any obstruction. Patient also noted to have moderate amount of stool. We'll give a soapsuds enema. Advance diet as tolerated.  #3 hypertension Increase Norvasc to 5 mg daily.  #4 dementia Continue Namenda.  #5 chronic kidney disease Stable.  #6 history of recurrent syncope Presently on a loop recorder. Being followed by cardiology as outpatient.   #7 hyperlipidemia Continue Lipitor.  #8 prophylaxis Lovenox for DVT prophylaxis.  Code Status: DO NOT RESUSCITATE Family Communication: Updated patient no family at bedside. Disposition Plan: Home versus SNF when medically stable.   Consultants:  None  Procedures:  Chest x-ray 12/18/2013  Abdominal x-ray 12/19/2013  Antibiotics:  IV Zosyn 12/19/2013  IV vancomycin 12/19/2013  HPI/Subjective: Patient sitting up in chair. Patient states she's feeling better.  Objective: Filed Vitals:   12/20/13 0537  BP: 161/65  Pulse: 72  Temp: 97.9 F (36.6 C)  Resp: 20    Intake/Output Summary (Last 24 hours) at 12/20/13 1041 Last data filed at 12/20/13 0752  Gross per 24 hour  Intake   1655 ml  Output    500 ml  Net   1155 ml   Filed Weights   12/19/13 1420  Weight: 58.695 kg (129 lb 6.4 oz)    Exam:   General:  nad  Cardiovascular: rrr  Respiratory: Some coarse breath sounds. No wheezing. No crackles.  Abdomen: Soft, nontender, nondistended, positive bowel sounds.  Musculoskeletal: No clubbing cyanosis or edema.  Data Reviewed: Basic Metabolic Panel:  Recent Labs Lab 12/18/13 2241  12/19/13 1610 12/20/13 0729  NA 139 140 143  K 3.6* 3.9 3.3*  CL 99 102 106  CO2 21 24 24   GLUCOSE 199* 80 89  BUN 30* 27* 19  CREATININE 1.06 1.16* 1.24*  CALCIUM 8.5 8.3* 7.9*   Liver Function Tests:  Recent Labs Lab 12/18/13 2241 12/19/13 1610  AST 36 30  ALT 25 25  ALKPHOS 59 72  BILITOT 0.4 0.3  PROT 6.0 5.5*  ALBUMIN 3.1* 2.6*    Recent Labs Lab 12/18/13 2241  LIPASE 19   No results found for this basename: AMMONIA,  in the last 168 hours CBC:  Recent Labs Lab 12/18/13 2241 12/19/13 1610 12/20/13 0729  WBC 5.1 7.9 6.3  NEUTROABS 4.7 6.6  --   HGB 12.2 11.2* 11.1*  HCT 36.2 33.9* 32.6*  MCV 93.1 93.6 92.6  PLT 155 155 145*   Cardiac Enzymes: No results found for this basename: CKTOTAL, CKMB, CKMBINDEX, TROPONINI,  in the last 168 hours BNP (last 3 results) No results found for this basename: PROBNP,  in the last 8760 hours CBG: No results found for this basename: GLUCAP,  in the last 168 hours  Recent Results (from the past 240 hour(s))  URINE CULTURE     Status: None   Collection Time    12/19/13 12:12 AM      Result Value Ref Range Status   Specimen Description URINE, RANDOM   Final   Special Requests NONE   Final   Culture  Setup Time     Final   Value: 12/19/2013 01:25     Performed at [facility]  Final   Value: NO GROWTH     Performed at [facility]   Culture     Final   Value: NO GROWTH     Performed at [facility]   Report Status 12/20/2013 FINAL   Final  MRSA PCR SCREENING     Status: None   Collection Time    12/19/13  3:04 PM      Result Value Ref Range Status   MRSA by PCR NEGATIVE  NEGATIVE Final   Comment:            The GeneXpert MRSA Assay (FDA     approved for NASAL specimens     only), is one component of a     comprehensive MRSA colonization     surveillance program. It is not     intended to diagnose MRSA     infection nor to guide or     monitor treatment for      MRSA infections.     Studies: Dg Chest 2 View  12/19/2013   CLINICAL DATA:  Emesis, shortness of breath and diarrhea.  EXAM: CHEST  2 VIEW  COMPARISON:  07/18/2007 and 12/14/2009  FINDINGS: Lungs are hypoinflated demonstrate bibasilar airspace opacification left worse than right likely infection. Possible small amount left pleural fluid. Cardiomediastinal silhouette and remainder of the exam is unchanged.  IMPRESSION: Bibasilar airspace process left worse than right likely infection. Possible small amount of left pleural fluid.   Electronically Signed   By: Marin Olp M.D.   On: 12/19/2013 00:15   Dg Abd 1 View  12/19/2013   CLINICAL DATA:  Nausea and vomiting  EXAM: ABDOMEN - 1 VIEW  COMPARISON:  None.  FINDINGS: Moderate volume of formed stool, especially in the distal colon. No evidence of bowel obstruction. No abnormal intra-abdominal mass effect or calcification. Lower abdominal and right pelvic surgical clips. Apparent L1 body compression is likely projectional.  IMPRESSION: Nonobstructive bowel gas pattern.   Electronically Signed   By: Jorje Guild M.D.   On: 12/19/2013 04:15    Scheduled Meds: . amLODipine  2.5 mg Oral Daily  . aspirin EC  81 mg Oral Daily  . atorvastatin  20 mg Oral Daily  . calcium carbonate  1 tablet Oral Q breakfast  . enoxaparin (LOVENOX) injection  40 mg Subcutaneous Q24H  . Memantine HCl ER  7 mg Oral Daily  . multivitamin with minerals  1 tablet Oral Daily  . oxybutynin  10 mg Oral Daily  . piperacillin-tazobactam (ZOSYN)  IV  3.375 g Intravenous 3 times per day  . potassium chloride  40 mEq Oral Q4H  . sertraline  50 mg Oral Daily  . vancomycin  750 mg Intravenous Q24H   Continuous Infusions: . sodium chloride 100 mL/hr at 12/20/13 0117    Principal Problem:   Pneumonia Active Problems:   Memory loss   Essential hypertension, benign   Nausea & vomiting   HLD (hyperlipidemia)    Time spent: 35 minutes    Eugenie Filler M.D. [facility] Pager [phone]. If 7PM-7AM, please contact night-coverage at [email or web address], password [facility] 12/20/2013, 10:41 AM  LOS: 2 days

## 2013-12-20 NOTE — Plan of Care (Signed)
Problem: Phase II Progression Outcomes Goal: Progress activity as tolerated unless otherwise ordered Outcome: Completed/Met Date Met:  12/20/13 Up in chair

## 2013-12-20 NOTE — Clinical Social Work Note (Signed)
CSW unable to reach contacts listed to complete assessment after several attempts and leaving messages. Patient attempted to contact [facility] to determine if patient is in fact from this facility. Facility states that patient is not a resident at their facility. CSW will continue to try and reach family to complete assessment.  Liz Beach MSW, [place], [place], [phone]

## 2013-12-21 LAB — CBC
HEMATOCRIT: 34.2 % — AB (ref 36.0–46.0)
Hemoglobin: 11.7 g/dL — ABNORMAL LOW (ref 12.0–15.0)
MCH: 31.6 pg (ref 26.0–34.0)
MCHC: 34.2 g/dL (ref 30.0–36.0)
MCV: 92.4 fL (ref 78.0–100.0)
Platelets: 172 10*3/uL (ref 150–400)
RBC: 3.7 MIL/uL — ABNORMAL LOW (ref 3.87–5.11)
RDW: 14.6 % (ref 11.5–15.5)
WBC: 6.1 10*3/uL (ref 4.0–10.5)

## 2013-12-21 LAB — BASIC METABOLIC PANEL
BUN: 12 mg/dL (ref 6–23)
CO2: 22 mEq/L (ref 19–32)
Calcium: 8.7 mg/dL (ref 8.4–10.5)
Chloride: 106 mEq/L (ref 96–112)
Creatinine, Ser: 1.17 mg/dL — ABNORMAL HIGH (ref 0.50–1.10)
GFR calc Af Amer: 47 mL/min — ABNORMAL LOW (ref >90)
GFR calc non Af Amer: 40 mL/min — ABNORMAL LOW (ref >90)
Glucose, Bld: 101 mg/dL — ABNORMAL HIGH (ref 70–99)
POTASSIUM: 4.4 meq/L (ref 3.7–5.3)
SODIUM: 141 meq/L (ref 137–147)

## 2013-12-21 MED ORDER — HALOPERIDOL LACTATE 5 MG/ML IJ SOLN
INTRAMUSCULAR | Status: AC
  Filled 2013-12-21: qty 1

## 2013-12-21 MED ORDER — LORAZEPAM 0.5 MG PO TABS
0.5000 mg | ORAL_TABLET | Freq: Three times a day (TID) | ORAL | Status: DC | PRN
  Administered 2013-12-21: 0.5 mg via ORAL
  Filled 2013-12-21: qty 1

## 2013-12-21 MED ORDER — SODIUM CHLORIDE 0.9 % IV SOLN
INTRAVENOUS | Status: DC
  Administered 2013-12-21: 19:00:00 via INTRAVENOUS
  Administered 2013-12-23: 75 mL/h via INTRAVENOUS
  Administered 2013-12-24 (×2): via INTRAVENOUS

## 2013-12-21 MED ORDER — HALOPERIDOL LACTATE 5 MG/ML IJ SOLN
5.0000 mg | Freq: Once | INTRAMUSCULAR | Status: AC
  Administered 2013-12-21: 5 mg via INTRAVENOUS

## 2013-12-21 MED ORDER — LORAZEPAM 2 MG/ML IJ SOLN
1.0000 mg | Freq: Once | INTRAMUSCULAR | Status: AC
  Administered 2013-12-21: 1 mg via INTRAVENOUS
  Filled 2013-12-21: qty 1

## 2013-12-21 NOTE — Progress Notes (Signed)
[facility] PROGRESS NOTE  Alicia [name] RSW:[id number] DOB: 1923/04/04 DOA: 12/18/2013 PCP: Shellia Carwin, PA-C  Assessment/Plan: #1 probable aspiration pneumonia versus community-acquired pneumonia Per chest x-ray. Patient with clinical improvement. Patient afebrile. Influenza PCR is negative. Continue empiric IV vancomycin IV Zosyn. Follow.  #2 nausea/ vomiting Improved. Abdominal x-ray negative for any obstruction. Patient also noted to have moderate amount of stool. Advance diet as tolerated.  #3 hypertension Continue Norvasc 5 mg daily.  #4 dementia Continue Namenda.  #5 chronic kidney disease Stable.  #6 history of recurrent syncope Presently on a loop recorder. Being followed by cardiology as outpatient.   #7 hyperlipidemia Continue Lipitor.  #8 prophylaxis Lovenox for DVT prophylaxis.  Code Status: DO NOT RESUSCITATE Family Communication: Updated patient no family at bedside. Disposition Plans: SNF when medically stable.   Consultants:  None  Procedures:  Chest x-ray 12/18/2013  Abdominal x-ray 12/19/2013  Antibiotics:  IV Zosyn 12/19/2013  IV vancomycin 12/19/2013  HPI/Subjective: Patient states she's feeling better. Patient pleasantly confused  Objective: Filed Vitals:   12/21/13 1038  BP: 136/70  Pulse:   Temp:   Resp:     Intake/Output Summary (Last 24 hours) at 12/21/13 1144 Last data filed at 12/21/13 1027  Gross per 24 hour  Intake  282.5 ml  Output      0 ml  Net  282.5 ml   Filed Weights   12/19/13 1420  Weight: 58.695 kg (129 lb 6.4 oz)    Exam:   General:  nad  Cardiovascular: rrr  Respiratory: Some coarse breath sounds. No wheezing. No crackles.  Abdomen: Soft, nontender, nondistended, positive bowel sounds.  Musculoskeletal: No clubbing cyanosis or edema.  Data Reviewed: Basic Metabolic Panel:  Recent Labs Lab 12/18/13 2241 12/19/13 1610 12/20/13 0729 12/21/13 0730  NA 139 140 143 141  K  3.6* 3.9 3.3* 4.4  CL 99 102 106 106  CO2 21 24 24 22   GLUCOSE 199* 80 89 101*  BUN 30* 27* 19 12  CREATININE 1.06 1.16* 1.24* 1.17*  CALCIUM 8.5 8.3* 7.9* 8.7   Liver Function Tests:  Recent Labs Lab 12/18/13 2241 12/19/13 1610  AST 36 30  ALT 25 25  ALKPHOS 59 72  BILITOT 0.4 0.3  PROT 6.0 5.5*  ALBUMIN 3.1* 2.6*    Recent Labs Lab 12/18/13 2241  LIPASE 19   No results found for this basename: AMMONIA,  in the last 168 hours CBC:  Recent Labs Lab 12/18/13 2241 12/19/13 1610 12/20/13 0729 12/21/13 0730  WBC 5.1 7.9 6.3 6.1  NEUTROABS 4.7 6.6  --   --   HGB 12.2 11.2* 11.1* 11.7*  HCT 36.2 33.9* 32.6* 34.2*  MCV 93.1 93.6 92.6 92.4  PLT 155 155 145* 172   Cardiac Enzymes: No results found for this basename: CKTOTAL, CKMB, CKMBINDEX, TROPONINI,  in the last 168 hours BNP (last 3 results) No results found for this basename: PROBNP,  in the last 8760 hours CBG: No results found for this basename: GLUCAP,  in the last 168 hours  Recent Results (from the past 240 hour(s))  URINE CULTURE     Status: None   Collection Time    12/19/13 12:12 AM      Result Value Ref Range Status   Specimen Description URINE, RANDOM   Final   Special Requests NONE   Final   Culture  Setup Time     Final   Value: 12/19/2013 01:25     Performed at [facility]   Colony Count     Final   Value: NO GROWTH     Performed at [facility]   Culture     Final   Value: NO GROWTH     Performed at [facility]   Report Status 12/20/2013 FINAL   Final  MRSA PCR SCREENING     Status: None   Collection Time    12/19/13  3:04 PM      Result Value Ref Range Status   MRSA by PCR NEGATIVE  NEGATIVE Final   Comment:            The GeneXpert MRSA Assay (FDA     approved for NASAL specimens     only), is one component of a     comprehensive MRSA colonization     surveillance program. It is not     intended to diagnose MRSA     infection nor to guide or      monitor treatment for     MRSA infections.  CLOSTRIDIUM DIFFICILE BY PCR     Status: None   Collection Time    12/19/13 10:45 PM      Result Value Ref Range Status   C difficile by pcr NEGATIVE  NEGATIVE Final     Studies: No results found.  Scheduled Meds: . amLODipine  5 mg Oral Daily  . aspirin EC  81 mg Oral Daily  . atorvastatin  20 mg Oral Daily  . calcium carbonate  1 tablet Oral Q breakfast  . enoxaparin (LOVENOX) injection  30 mg Subcutaneous Q24H  . Memantine HCl ER  7 mg Oral Daily  . multivitamin with minerals  1 tablet Oral Daily  . oxybutynin  10 mg Oral Daily  . piperacillin-tazobactam (ZOSYN)  IV  3.375 g Intravenous 3 times per day  . sertraline  50 mg Oral Daily  . vancomycin  750 mg Intravenous Q24H   Continuous Infusions:    Principal Problem:   Pneumonia Active Problems:   Memory loss   Essential hypertension, benign   Nausea & vomiting   HLD (hyperlipidemia)    Time spent: 35 minutes    Eugenie Filler M.D. [facility] Pager [phone]. If 7PM-7AM, please contact night-coverage at [email or web address], password [email or web address] 12/21/2013, 11:44 AM  LOS: 3 days

## 2013-12-21 NOTE — Clinical Social Work Peds Assess (Addendum)
Clinical Social Work Department CLINICAL SOCIAL WORK PLACEMENT NOTE 12/21/2013  Patient:  Alicia [name], Alicia [name]  Account Number:  [id number] Admit date:  12/18/2013  Clinical Social Worker:  Carrington Clamp, LCSWA  Date/time:  12/21/2013 04:26 PM  Clinical Social Work is seeking post-discharge placement for this patient at the following level of care:   [place]   (*CSW will update this form in Epic as items are completed)   12/21/2013  Patient/family provided with [place] Department of Clinical Social Work's list of facilities offering this level of care within the geographic area requested by the patient (or if unable, by the patient's family).  12/21/2013  Patient/family informed of their freedom to choose among providers that offer the needed level of care, that participate in Medicare, Medicaid or managed care program needed by the patient, have an available bed and are willing to accept the patient.  12/21/2013  Patient/family informed of [facility]' ownership interest in [facility], as well as of the fact that they are under no obligation to receive care at this facility.  PASARR submitted to [facility] on  PASARR number received from [place] on   FL2 transmitted to all facilities in geographic area requested by pt/family on  12/21/2013 FL2 transmitted to all facilities within larger geographic area on   Patient informed that his/her managed care company has contracts with or will negotiate with  certain facilities, including the following:     Patient/family informed of bed offers received:  12/23/13 Patient chooses bed at [facility] Physician recommends and patient chooses bed at    Patient to be transferred to [facility] on  12/24/13 Patient to be transferred to facility by family vehicle  The following physician request were entered in Epic:   Additional Comments: PASARR could not be submitted on 12/21/13, as there was information previously  entered incorrectly.  Addendum: PASARR submitted and received. Left bed offer list on pt's chart, left son a voicemail explaining CSW has done this. CSW recounted bed offers in voicemail message and requested call back for selection.  Addendum: Provided bed offers. [place] able to offer a bed. Informed family; they are contacting facility to do admissions paperwork. Pt to discharge tomorrow. Daughter-in-law states she will drive pt to facility.  [place], [place] Clinical Social Worker [phone]

## 2013-12-22 MED ORDER — DOCUSATE SODIUM 100 MG PO CAPS
200.0000 mg | ORAL_CAPSULE | Freq: Two times a day (BID) | ORAL | Status: DC
  Administered 2013-12-22 – 2013-12-25 (×6): 200 mg via ORAL
  Filled 2013-12-22 (×8): qty 2

## 2013-12-22 MED ORDER — POLYETHYLENE GLYCOL 3350 17 G PO PACK
17.0000 g | PACK | Freq: Every day | ORAL | Status: DC
  Administered 2013-12-22 – 2013-12-24 (×3): 17 g via ORAL
  Filled 2013-12-22 (×4): qty 1

## 2013-12-22 NOTE — Progress Notes (Signed)
[facility] PROGRESS NOTE  SYLIVA MEE VPX:[id number] DOB: [birth year] DOA: 12/18/2013 PCP: Shellia Carwin, PA-C  Assessment/Plan: #1 probable aspiration pneumonia versus community-acquired pneumonia Per chest x-ray. Patient with clinical improvement. Patient afebrile. Influenza PCR is negative. Continue empiric  IV Zosyn. D/C  IV Vancomycin after today's dose.Follow.  #2 nausea/ vomiting Improved. Abdominal x-ray negative for any obstruction. Patient also noted to have moderate amount of stool. Advance diet as tolerated.  #3 hypertension Continue Norvasc 5 mg daily.  #4 dementia Continue Namenda.  #5 chronic kidney disease Stable.  #6 history of recurrent syncope Presently on a loop recorder. Being followed by cardiology as outpatient.   #7 hyperlipidemia Continue Lipitor.  #8 prophylaxis Lovenox for DVT prophylaxis.  Code Status: DO NOT RESUSCITATE Family Communication: Updated patient no family at bedside. Disposition Plans: SNF when medically stable.   Consultants:  None  Procedures:  Chest x-ray 12/18/2013  Abdominal x-ray 12/19/2013  Antibiotics:  IV Zosyn 12/19/2013  IV vancomycin 12/19/2013--->12/22/13  HPI/Subjective: Patient sleeping.  Objective: Filed Vitals:   12/22/13 0704  BP: 155/66  Pulse: 67  Temp:   Resp:     Intake/Output Summary (Last 24 hours) at 12/22/13 1045 Last data filed at 12/22/13 0813  Gross per 24 hour  Intake 499.67 ml  Output      0 ml  Net 499.67 ml   Filed Weights   12/19/13 1420  Weight: 58.695 kg (129 lb 6.4 oz)    Exam:   General:  nad  Cardiovascular: rrr  Respiratory: CTAB anterior lung fields. No wheezing. No crackles.  Abdomen: Soft, nontender, nondistended, positive bowel sounds.  Musculoskeletal: No clubbing cyanosis or edema.  Data Reviewed: Basic Metabolic Panel:  Recent Labs Lab 12/18/13 2241 12/19/13 1610 12/20/13 0729 12/21/13 0730  NA 139 140 143 141  K 3.6* 3.9  3.3* 4.4  CL 99 102 106 106  CO2 21 24 24 22   GLUCOSE 199* 80 89 101*  BUN 30* 27* 19 12  CREATININE 1.06 1.16* 1.24* 1.17*  CALCIUM 8.5 8.3* 7.9* 8.7   Liver Function Tests:  Recent Labs Lab 12/18/13 2241 12/19/13 1610  AST 36 30  ALT 25 25  ALKPHOS 59 72  BILITOT 0.4 0.3  PROT 6.0 5.5*  ALBUMIN 3.1* 2.6*    Recent Labs Lab 12/18/13 2241  LIPASE 19   No results found for this basename: AMMONIA,  in the last 168 hours CBC:  Recent Labs Lab 12/18/13 2241 12/19/13 1610 12/20/13 0729 12/21/13 0730  WBC 5.1 7.9 6.3 6.1  NEUTROABS 4.7 6.6  --   --   HGB 12.2 11.2* 11.1* 11.7*  HCT 36.2 33.9* 32.6* 34.2*  MCV 93.1 93.6 92.6 92.4  PLT 155 155 145* 172   Cardiac Enzymes: No results found for this basename: CKTOTAL, CKMB, CKMBINDEX, TROPONINI,  in the last 168 hours BNP (last 3 results) No results found for this basename: PROBNP,  in the last 8760 hours CBG: No results found for this basename: GLUCAP,  in the last 168 hours  Recent Results (from the past 240 hour(s))  URINE CULTURE     Status: None   Collection Time    12/19/13 12:12 AM      Result Value Ref Range Status   Specimen Description URINE, RANDOM   Final   Special Requests NONE   Final   Culture  Setup Time     Final   Value: 12/19/2013 01:25     Performed at [facility]  Colony Count     Final   Value: NO GROWTH     Performed at [facility]   Culture     Final   Value: NO GROWTH     Performed at [facility]   Report Status 12/20/2013 FINAL   Final  MRSA PCR SCREENING     Status: None   Collection Time    12/19/13  3:04 PM      Result Value Ref Range Status   MRSA by PCR NEGATIVE  NEGATIVE Final   Comment:            The GeneXpert MRSA Assay (FDA     approved for NASAL specimens     only), is one component of a     comprehensive MRSA colonization     surveillance program. It is not     intended to diagnose MRSA     infection nor to guide or     monitor  treatment for     MRSA infections.  CLOSTRIDIUM DIFFICILE BY PCR     Status: None   Collection Time    12/19/13 10:45 PM      Result Value Ref Range Status   C difficile by pcr NEGATIVE  NEGATIVE Final  STOOL CULTURE     Status: None   Collection Time    12/19/13 10:46 PM      Result Value Ref Range Status   Specimen Description STOOL   Final   Special Requests NONE   Final   Culture     Final   Value: NO SUSPICIOUS COLONIES, CONTINUING TO HOLD     Performed at [facility]   Report Status PENDING   Incomplete     Studies: No results found.  Scheduled Meds: . amLODipine  5 mg Oral Daily  . aspirin EC  81 mg Oral Daily  . atorvastatin  20 mg Oral Daily  . calcium carbonate  1 tablet Oral Q breakfast  . enoxaparin (LOVENOX) injection  30 mg Subcutaneous Q24H  . haloperidol lactate      . Memantine HCl ER  7 mg Oral Daily  . multivitamin with minerals  1 tablet Oral Daily  . oxybutynin  10 mg Oral Daily  . piperacillin-tazobactam (ZOSYN)  IV  3.375 g Intravenous 3 times per day  . sertraline  50 mg Oral Daily  . vancomycin  750 mg Intravenous Q24H   Continuous Infusions: . sodium chloride 10 mL/hr at 12/22/13 1013    Principal Problem:   Pneumonia Active Problems:   Memory loss   Essential hypertension, benign   Nausea & vomiting   HLD (hyperlipidemia)    Time spent: 35 minutes    Eugenie Filler M.D. [facility] Pager [phone]. If 7PM-7AM, please contact night-coverage at [email or web address], password [facility] 12/22/2013, 10:45 AM  LOS: 4 days

## 2013-12-22 NOTE — Progress Notes (Signed)
Informed Avangeline Stockburger, pt's daughter-in-law, of use of wrist restraints during the night including haldol and ativan due to pt being very restless in the middle of the night and trying to get out of bed.  [name] verbalized understanding and had no further questions or concerns.  Pt resting comfortably at this time.  Will continue to monitor.  Iantha Fallen RN 8:20 AM 12/22/2013

## 2013-12-22 NOTE — Progress Notes (Signed)
ANTIBIOTIC CONSULT NOTE - INITIAL  Pharmacy Consult for vancomycin and zosyn Indication: pneumonia  Allergies  Allergen Reactions  . Iohexol Rash  . Ciprofloxacin Other (See Comments)    weakness  . Nsaids     Renal insufficiency  . Sulfa Antibiotics Hives and Itching  . Ivp Dye [Iodinated Diagnostic Agents] Rash    Patient Measurements: Height: 5\' 3"  (160 cm) Weight: 129 lb 6.4 oz (58.695 kg) IBW/kg (Calculated) : 52.4  Vital Signs: Temp: 97.4 F (36.3 C) (04/12 0613) Temp src: Oral (04/12 0613) BP: 155/66 mmHg (04/12 0704) Pulse Rate: 67 (04/12 0704) Labs:  Recent Labs  12/19/13 1610 12/20/13 0729 12/21/13 0730  WBC 7.9 6.3 6.1  HGB 11.2* 11.1* 11.7*  PLT 155 145* 172  CREATININE 1.16* 1.24* 1.17*   Microbiology: Recent Results (from the past 720 hour(s))  URINE CULTURE     Status: None   Collection Time    12/19/13 12:12 AM      Result Value Ref Range Status   Specimen Description URINE, RANDOM   Final   Special Requests NONE   Final   Culture  Setup Time     Final   Value: 12/19/2013 01:25     Performed at [place]     Final   Value: NO GROWTH     Performed at [facility]   Culture     Final   Value: NO GROWTH     Performed at [facility]   Report Status 12/20/2013 FINAL   Final  MRSA PCR SCREENING     Status: None   Collection Time    12/19/13  3:04 PM      Result Value Ref Range Status   MRSA by PCR NEGATIVE  NEGATIVE Final   Comment:            The GeneXpert MRSA Assay (FDA     approved for NASAL specimens     only), is one component of a     comprehensive MRSA colonization     surveillance program. It is not     intended to diagnose MRSA     infection nor to guide or     monitor treatment for     MRSA infections.  CLOSTRIDIUM DIFFICILE BY PCR     Status: None   Collection Time    12/19/13 10:45 PM      Result Value Ref Range Status   C difficile by pcr NEGATIVE  NEGATIVE Final  STOOL  CULTURE     Status: None   Collection Time    12/19/13 10:46 PM      Result Value Ref Range Status   Specimen Description STOOL   Final   Special Requests NONE   Final   Culture     Final   Value: NO SUSPICIOUS COLONIES, CONTINUING TO HOLD     Performed at [facility]   Report Status PENDING   Incomplete    Medical History: Past Medical History  Diagnosis Date  . Dementia     significant  . Renal disease   . Memory loss 12/10/2012  . Depression 12/10/2012  . Anxiety state, unspecified 12/10/2012  . Spells 12/10/2012  . Essential hypertension, benign 12/10/2012  . Other and unspecified hyperlipidemia 12/10/2012  . Loss of weight 12/10/2012  . Carotid artery stenosis   . Ovarian cancer     treated with surgery & chemo  . Ankle fracture, right   .  Unstable gait     mild  . Syncope 12/10/2012    Recurrent, sporadic episodes  . Carotid artery disease     Medications:  Prescriptions prior to admission  Medication Sig Dispense Refill  . amLODipine (NORVASC) 2.5 MG tablet Take 1 tablet (2.5 mg total) by mouth daily.  180 tablet  3  . aspirin EC 81 MG tablet Take 81 mg by mouth daily.      Marland Kitchen atorvastatin (LIPITOR) 20 MG tablet Take 20 mg by mouth daily.      Marland Kitchen azithromycin (ZITHROMAX) 250 MG tablet Take 1 tablet by mouth daily.      . calcium carbonate 200 MG capsule Take 600 mg by mouth daily. Take at night      . Memantine HCl ER (NAMENDA XR) 7 MG CP24 Take 1 capsule (7 mg total) by mouth daily. To be used with free 30 day trial offer.  30 capsule  0  . Multiple Vitamin (MULTIVITAMIN WITH MINERALS) TABS Take 1 tablet by mouth daily.      Marland Kitchen oxybutynin (DITROPAN-XL) 10 MG 24 hr tablet Take 10 mg by mouth daily.      . sertraline (ZOLOFT) 50 MG tablet Take 50 mg by mouth daily.       Assessment: 80 year old female presents from nursing home with n/v/d, xray with concern for pneumonia.  Pt currently afebrile, without leukocytosis.  Pt received vancomycin from 4/9-4/12.  Goal  of Therapy:  Resolution of infection  Plan:  Continue Zosyn 3.375 g IV q8h F/u cultures and renal function  Hughes Better, PharmD, BCPS Clinical Pharmacist Pager: [phone] 12/22/2013 11:31 AM

## 2013-12-22 NOTE — Progress Notes (Signed)
Original order was for 4 point soft wrist and ankle restraints.  At 0100, only 2 restraints were available on the floor so wrist restraints were applied.  Notified supply to restock more soft restraints.  At 0130 pt fell asleep.  At 0600, pt had continued to sleep so no need for ankle restraints and restraints still had not been stocked on the floor.  Order was changed to reflect this need.  No other concerns at this time.  Will continue to monitor.  Pt resting comfortably.  Iantha Fallen RN 12/22/2013 0600

## 2013-12-23 LAB — CBC
HCT: 34.1 % — ABNORMAL LOW (ref 36.0–46.0)
HEMOGLOBIN: 11.7 g/dL — AB (ref 12.0–15.0)
MCH: 31.5 pg (ref 26.0–34.0)
MCHC: 34.3 g/dL (ref 30.0–36.0)
MCV: 91.7 fL (ref 78.0–100.0)
Platelets: 207 10*3/uL (ref 150–400)
RBC: 3.72 MIL/uL — ABNORMAL LOW (ref 3.87–5.11)
RDW: 14.4 % (ref 11.5–15.5)
WBC: 5.5 10*3/uL (ref 4.0–10.5)

## 2013-12-23 LAB — BASIC METABOLIC PANEL
BUN: 25 mg/dL — ABNORMAL HIGH (ref 6–23)
CALCIUM: 8.6 mg/dL (ref 8.4–10.5)
CO2: 22 mEq/L (ref 19–32)
Chloride: 102 mEq/L (ref 96–112)
Creatinine, Ser: 1.53 mg/dL — ABNORMAL HIGH (ref 0.50–1.10)
GFR calc Af Amer: 34 mL/min — ABNORMAL LOW (ref >90)
GFR calc non Af Amer: 29 mL/min — ABNORMAL LOW (ref >90)
GLUCOSE: 100 mg/dL — AB (ref 70–99)
POTASSIUM: 3.9 meq/L (ref 3.7–5.3)
Sodium: 141 mEq/L (ref 137–147)

## 2013-12-23 LAB — STOOL CULTURE

## 2013-12-23 MED ORDER — AMOXICILLIN-POT CLAVULANATE 500-125 MG PO TABS
1.0000 | ORAL_TABLET | Freq: Two times a day (BID) | ORAL | Status: DC
  Administered 2013-12-23 – 2013-12-25 (×4): 500 mg via ORAL
  Filled 2013-12-23 (×5): qty 1

## 2013-12-23 MED ORDER — AMOXICILLIN-POT CLAVULANATE 875-125 MG PO TABS: 1.0000 | ORAL_TABLET | Freq: Two times a day (BID) | ORAL | Status: DC

## 2013-12-23 MED ORDER — PIPERACILLIN-TAZOBACTAM IN DEX 2-0.25 GM/50ML IV SOLN
2.2500 g | Freq: Three times a day (TID) | INTRAVENOUS | Status: DC
  Administered 2013-12-23: 2.25 g via INTRAVENOUS
  Filled 2013-12-23 (×4): qty 50

## 2013-12-23 NOTE — Progress Notes (Signed)
[facility] PROGRESS NOTE  Alicia [name] GMW:[id number] DOB: 1923/05/02 DOA: 12/18/2013 PCP: Shellia Carwin, PA-C  Assessment/Plan: #1 probable aspiration pneumonia versus community-acquired pneumonia Per chest x-ray. Patient with clinical improvement. Patient afebrile. Influenza PCR is negative. Change  IV Zosyn to oral Augmentin, to complete course of antibiotic therapy.Follow.  #2 nausea/ vomiting Improved. Abdominal x-ray negative for any obstruction. Patient also noted to have moderate amount of stool. Advance diet as tolerated.  #3 hypertension Continue Norvasc 5 mg daily.  #4 dementia Continue Namenda.  #5 chronic kidney disease Stable.  #6 history of recurrent syncope Presently on a loop recorder. Being followed by cardiology as outpatient.   #7 hyperlipidemia Continue Lipitor.  #8 prophylaxis Lovenox for DVT prophylaxis.  Code Status: DO NOT RESUSCITATE Family Communication: Updated patient and daughter-in-law at bedside. Disposition Plans: SNF when medically stable.   Consultants:  None  Procedures:  Chest x-ray 12/18/2013  Abdominal x-ray 12/19/2013  Antibiotics:  IV Zosyn 12/19/2013---> 12/23/2013  IV vancomycin 12/19/2013--->12/22/13  Oral Augmentin 12/23/2013  HPI/Subjective: Patient sitting up in chair in no acute distress. Patient denies any shortness of breath.  Objective: Filed Vitals:   12/23/13 0445  BP: 120/64  Pulse: 88  Temp: 98.6 F (37 C)  Resp: 18    Intake/Output Summary (Last 24 hours) at 12/23/13 1334 Last data filed at 12/23/13 1000  Gross per 24 hour  Intake    360 ml  Output      0 ml  Net    360 ml   Filed Weights   12/19/13 1420  Weight: 58.695 kg (129 lb 6.4 oz)    Exam:   General:  nad  Cardiovascular: rrr  Respiratory: CTAB anterior lung fields. No wheezing. No crackles.  Abdomen: Soft, nontender, nondistended, positive bowel sounds.  Musculoskeletal: No clubbing cyanosis or  edema.  Data Reviewed: Basic Metabolic Panel:  Recent Labs Lab 12/18/13 2241 12/19/13 1610 12/20/13 0729 12/21/13 0730 12/23/13 0709  NA 139 140 143 141 141  K 3.6* 3.9 3.3* 4.4 3.9  CL 99 102 106 106 102  CO2 21 24 24 22 22   GLUCOSE 199* 80 89 101* 100*  BUN 30* 27* 19 12 25*  CREATININE 1.06 1.16* 1.24* 1.17* 1.53*  CALCIUM 8.5 8.3* 7.9* 8.7 8.6   Liver Function Tests:  Recent Labs Lab 12/18/13 2241 12/19/13 1610  AST 36 30  ALT 25 25  ALKPHOS 59 72  BILITOT 0.4 0.3  PROT 6.0 5.5*  ALBUMIN 3.1* 2.6*    Recent Labs Lab 12/18/13 2241  LIPASE 19   No results found for this basename: AMMONIA,  in the last 168 hours CBC:  Recent Labs Lab 12/18/13 2241 12/19/13 1610 12/20/13 0729 12/21/13 0730 12/23/13 0709  WBC 5.1 7.9 6.3 6.1 5.5  NEUTROABS 4.7 6.6  --   --   --   HGB 12.2 11.2* 11.1* 11.7* 11.7*  HCT 36.2 33.9* 32.6* 34.2* 34.1*  MCV 93.1 93.6 92.6 92.4 91.7  PLT 155 155 145* 172 207   Cardiac Enzymes: No results found for this basename: CKTOTAL, CKMB, CKMBINDEX, TROPONINI,  in the last 168 hours BNP (last 3 results) No results found for this basename: PROBNP,  in the last 8760 hours CBG: No results found for this basename: GLUCAP,  in the last 168 hours  Recent Results (from the past 240 hour(s))  URINE CULTURE     Status: None   Collection Time    12/19/13 12:12 AM  Result Value Ref Range Status   Specimen Description URINE, RANDOM   Final   Special Requests NONE   Final   Culture  Setup Time     Final   Value: 12/19/2013 01:25     Performed at [facility]     Final   Value: NO GROWTH     Performed at [facility]   Culture     Final   Value: NO GROWTH     Performed at [facility]   Report Status 12/20/2013 FINAL   Final  MRSA PCR SCREENING     Status: None   Collection Time    12/19/13  3:04 PM      Result Value Ref Range Status   MRSA by PCR NEGATIVE  NEGATIVE Final   Comment:             The GeneXpert MRSA Assay (FDA     approved for NASAL specimens     only), is one component of a     comprehensive MRSA colonization     surveillance program. It is not     intended to diagnose MRSA     infection nor to guide or     monitor treatment for     MRSA infections.  CLOSTRIDIUM DIFFICILE BY PCR     Status: None   Collection Time    12/19/13 10:45 PM      Result Value Ref Range Status   C difficile by pcr NEGATIVE  NEGATIVE Final  STOOL CULTURE     Status: None   Collection Time    12/19/13 10:46 PM      Result Value Ref Range Status   Specimen Description STOOL   Final   Special Requests NONE   Final   Culture     Final   Value: NO SALMONELLA, SHIGELLA, CAMPYLOBACTER, YERSINIA, OR E.COLI 0157:H7 ISOLATED     Performed at [facility]   Report Status 12/23/2013 FINAL   Final     Studies: No results found.  Scheduled Meds: . amLODipine  5 mg Oral Daily  . aspirin EC  81 mg Oral Daily  . atorvastatin  20 mg Oral Daily  . calcium carbonate  1 tablet Oral Q breakfast  . docusate sodium  200 mg Oral BID  . enoxaparin (LOVENOX) injection  30 mg Subcutaneous Q24H  . Memantine HCl ER  7 mg Oral Daily  . multivitamin with minerals  1 tablet Oral Daily  . oxybutynin  10 mg Oral Daily  . piperacillin-tazobactam (ZOSYN)  IV  2.25 g Intravenous 3 times per day  . polyethylene glycol  17 g Oral Daily  . sertraline  50 mg Oral Daily   Continuous Infusions: . sodium chloride 10 mL/hr at 12/22/13 1013    Principal Problem:   Pneumonia Active Problems:   Memory loss   Essential hypertension, benign   Nausea & vomiting   HLD (hyperlipidemia)    Time spent: 35 minutes    Eugenie Filler M.D. [facility] Pager [phone]. If 7PM-7AM, please contact night-coverage at [email or web address], password [facility] 12/23/2013, 1:34 PM  LOS: 5 days

## 2013-12-23 NOTE — Progress Notes (Signed)
Seen and agreed 12/23/2013 [place] PTA [phone] pager [phone] office

## 2013-12-23 NOTE — Progress Notes (Signed)
Family wants [facility]; facility considering, but requesting information about loop recorder. Paged MD requesting call ASAP to inquire about device. Facility wants to know if they need to follow-up/do anything to monitor this. Will inform facility after speaking with MD.   Ky Barban, MSW, [place] Social Worker [phone]

## 2013-12-23 NOTE — Progress Notes (Signed)
ANTIBIOTIC CONSULT NOTE - FOLLOW UP  Pharmacy Consult for zosyn Indication: aspiration PNA  Allergies  Allergen Reactions  . Iohexol Rash  . Ciprofloxacin Other (See Comments)    weakness  . Nsaids     Renal insufficiency  . Sulfa Antibiotics Hives and Itching  . Ivp Dye [Iodinated Diagnostic Agents] Rash    Patient Measurements: Height: 5\' 3"  (160 cm) Weight: 129 lb 6.4 oz (58.695 kg) IBW/kg (Calculated) : 52.4  Vital Signs: Temp: 98.6 F (37 C) (04/13 0445) Temp src: Oral (04/13 0445) BP: 120/64 mmHg (04/13 0445) Pulse Rate: 88 (04/13 0445) Labs:  Recent Labs  12/21/13 0730 12/23/13 0709  WBC 6.1 5.5  HGB 11.7* 11.7*  PLT 172 207  CREATININE 1.17* 1.53*   Microbiology: Recent Results (from the past 720 hour(s))  URINE CULTURE     Status: None   Collection Time    12/19/13 12:12 AM      Result Value Ref Range Status   Specimen Description URINE, RANDOM   Final   Special Requests NONE   Final   Culture  Setup Time     Final   Value: 12/19/2013 01:25     Performed at [facility]     Final   Value: NO GROWTH     Performed at [facility]   Culture     Final   Value: NO GROWTH     Performed at [facility]   Report Status 12/20/2013 FINAL   Final  MRSA PCR SCREENING     Status: None   Collection Time    12/19/13  3:04 PM      Result Value Ref Range Status   MRSA by PCR NEGATIVE  NEGATIVE Final   Comment:            The GeneXpert MRSA Assay (FDA     approved for NASAL specimens     only), is one component of a     comprehensive MRSA colonization     surveillance program. It is not     intended to diagnose MRSA     infection nor to guide or     monitor treatment for     MRSA infections.  CLOSTRIDIUM DIFFICILE BY PCR     Status: None   Collection Time    12/19/13 10:45 PM      Result Value Ref Range Status   C difficile by pcr NEGATIVE  NEGATIVE Final  STOOL CULTURE     Status: None   Collection Time   12/19/13 10:46 PM      Result Value Ref Range Status   Specimen Description STOOL   Final   Special Requests NONE   Final   Culture     Final   Value: NO SALMONELLA, SHIGELLA, CAMPYLOBACTER, YERSINIA, OR E.COLI 0157:H7 ISOLATED     Performed at [facility]   Report Status 12/23/2013 FINAL   Final   Assessment: 80 year old female presents from nursing home with n/v/d, xray with concern for pneumonia.  Pt currently afebrile, without leukocytosis.  Pt received vancomycin from 4/9-4/12. She remains on Zosyn. Her renal function declined overnight with current est CrCl ~34mL/min with SCr 1.5.   Goal of Therapy:  Resolution of infection  Plan:  1. Reduce Zosyn to 2.25g IV q8h 2. Follow renal function, LOT, clinical progression  Jerred Zaremba D. Jahden Schara, PharmD, BCPS Clinical Pharmacist Pager: [phone] 12/23/2013 11:52 AM

## 2013-12-23 NOTE — Progress Notes (Signed)
Physical Therapy Treatment Patient Details Name: Alicia [name] MRN: [id number] DOB: 1922-12-28 Today's Date: 12/23/2013    History of Present Illness  Alicia [name] is a 80 y.o. female with history of dementia, hypertension, hyperlipidemia, chronic kidney disease, history of ovarian cancer status post surgery was brought to the ER after patient was found to have multiple episodes of nausea and vomiting. As per the daughter patient did not have diarrhea. In the ER patient was found to be febrile with temperatures around 101.37F. Chest x-ray shows infiltrates concerning for pneumonia. Patient has been admitted for further workup. Patient is a somnolent but arousable denies any pain. Abdominal exam is benign. Patient was just placed in the facility on Friday as patient's daughter was traveling. She usually stays with her daughter at her house. Patient has history of recurrent syncope for which patient has loop recorder and is being followed by Dr. Wynonia Lawman and Dr. Rayann Heman, cardiologist. Patient was recently started on Namenda few weeks ago after discontinuing Aricept due to bradycardia.    PT Comments    Patient is able to tolerate treatment today. Patient is progressing towards goals but requires constant cues with cognition for safety. Patient unable to constantly follow one step commands safely. Continue working towards goals. Continue to recommend SNF for patient safety.    Follow Up Recommendations  SNF     Equipment Recommendations  Rolling walker with 5" wheels    Recommendations for Other Services       Precautions / Restrictions Precautions Precautions: Fall Restrictions Weight Bearing Restrictions: No    Mobility  Bed Mobility               General bed mobility comments: Patient in recliner prior to therapy  Transfers Overall transfer level: Needs assistance Equipment used: Rolling walker (2 wheeled) Transfers: Sit to/from Stand Sit to Stand: Min guard         General transfer comment: cues for safe technique. SIt to stand x 5 to instruct proper technique for hand placement   Ambulation/Gait Ambulation/Gait assistance: Min assist Ambulation Distance (Feet): 150 Feet Assistive device: Rolling walker (2 wheeled) Gait Pattern/deviations: Step-through pattern;Decreased stride length Gait velocity: decreased   General Gait Details: Patient required cues for saefty with steering RW safely in hall   Stairs            Wheelchair Mobility    Modified Rankin (Stroke Patients Only)       Balance Overall balance assessment: Needs assistance Sitting-balance support: Bilateral upper extremity supported;Feet unsupported Sitting balance-Leahy Scale: Good     Standing balance support: Bilateral upper extremity supported Standing balance-Leahy Scale: Poor Standing balance comment: Patient relies on RW with standing balance. Patient required cues for safety                    Cognition Arousal/Alertness: Awake/alert Behavior During Therapy: WFL for tasks assessed/performed Overall Cognitive Status: No family/caregiver present to determine baseline cognitive functioning       Memory: Decreased short-term memory;Decreased recall of precautions              Exercises Total Joint Exercises Long Arc Quad: AROM;Seated;Both;10 reps Marching in Standing: AROM;Seated;Both;10 reps    General Comments        Pertinent Vitals/Pain Patient denies pain    Home Living                      Prior Function  PT Goals (current goals can now be found in the care plan section) Progress towards PT goals: Progressing toward goals    Frequency  Min 3X/week    PT Plan Current plan remains appropriate    Co-evaluation             End of Session Equipment Utilized During Treatment: Gait belt Activity Tolerance: Patient tolerated treatment well Patient left: in chair;with call bell/phone within  reach;with chair alarm set;with nursing/sitter in room     Time: 3491-7915 PT Time Calculation (min): 26 min  Charges:                       G Codes:      [place], SPTA 12/23/2013, 2:36 PM

## 2013-12-24 DIAGNOSIS — F411 Generalized anxiety disorder: Secondary | ICD-10-CM

## 2013-12-24 LAB — BASIC METABOLIC PANEL
BUN: 23 mg/dL (ref 6–23)
CHLORIDE: 106 meq/L (ref 96–112)
CO2: 22 mEq/L (ref 19–32)
Calcium: 8.1 mg/dL — ABNORMAL LOW (ref 8.4–10.5)
Creatinine, Ser: 1.27 mg/dL — ABNORMAL HIGH (ref 0.50–1.10)
GFR calc Af Amer: 42 mL/min — ABNORMAL LOW (ref >90)
GFR calc non Af Amer: 37 mL/min — ABNORMAL LOW (ref >90)
Glucose, Bld: 111 mg/dL — ABNORMAL HIGH (ref 70–99)
POTASSIUM: 3.9 meq/L (ref 3.7–5.3)
Sodium: 141 mEq/L (ref 137–147)

## 2013-12-24 MED ORDER — AMOXICILLIN-POT CLAVULANATE 500-125 MG PO TABS: 1.0000 | ORAL_TABLET | Freq: Two times a day (BID) | ORAL | Status: DC

## 2013-12-24 MED ORDER — POLYETHYLENE GLYCOL 3350 17 G PO PACK: 17.0000 g | PACK | Freq: Every day | ORAL | Status: DC

## 2013-12-24 MED ORDER — LORAZEPAM 0.5 MG PO TABS: 0.5000 mg | ORAL_TABLET | Freq: Three times a day (TID) | ORAL | Status: DC | PRN

## 2013-12-24 NOTE — Discharge Summary (Signed)
Physician Discharge Summary  Alicia [name] WUX:[id number] DOB: [birth year] DOA: 12/18/2013  PCP: Shellia Carwin, PA-C  Admit date: 12/18/2013 Discharge date: 12/24/2013  Time spent: 60 minutes  Recommendations for Outpatient Follow-up #1 followup with M.D. at the skilled nursing facility.  Discharge Diagnoses:  Principal Problem:   Pneumonia Active Problems:   Memory loss   Essential hypertension, benign   Nausea & vomiting   HLD (hyperlipidemia)   Discharge Condition: Stable and improved  Diet recommendation: Regular  Filed Weights   12/19/13 1420  Weight: 58.695 kg (129 lb 6.4 oz)    History of present illness:  Alicia [name] is a 80 y.o. female with history of dementia, hypertension, hyperlipidemia, chronic kidney disease, history of ovarian cancer status post surgery was brought to the ER after patient was found to have multiple episodes of nausea and vomiting. As per the daughter patient did not have diarrhea. In the ER patient was found to be febrile with temperatures around 101.65F. Chest x-ray shows infiltrates concerning for pneumonia. Patient has been admitted for further workup. Patient is a somnolent but arousable denies any pain. Abdominal exam is benign. Patient was just placed in the facility on Friday as patient's daughter was traveling. She usually stays with her daughter at her house. Patient has history of recurrent syncope for which patient has loop recorder and is being followed by Dr. Wynonia Lawman and Dr. Rayann Heman, cardiologist. Patient was recently started on Namenda few weeks ago after discontinuing Aricept due to bradycardia.   Hospital Course:  #1 probable aspiration pneumonia versus community-acquired pneumonia  Per chest x-ray. Patient was admitted and started empirically on IV vancomycin and IV Zosyn. Patient remained afebrile. Influenza PCR which was done was negative. Patient improved clinically and was subsequently transitioned to oral Augmentin and  will be discharged on 3 more days of oral Augmentin to complete a one-week course of antibiotic therapy. Patient will followup with M.D. at the skilled nursing facility.  #2 nausea/ vomiting  Improved. Abdominal x-ray negative for any obstruction. Patient also noted to have moderate amount of stool. Patient was given a soapsuds enema. Patient did not have any further nausea or vomiting for the rest of the hospitalization. Patient's diet was advanced and she tolerated a solid diet.  #3 hypertension  Continued on Norvasc 5 mg daily.  #4 dementia  Continued on Namenda.  #5 chronic kidney disease  Stable.  #6 history of recurrent syncope  Presently on a loop recorder. No episodes during the hospitalization. Being followed by cardiology as outpatient.  #7 hyperlipidemia  Continued on Lipitor.   Procedures: Chest x-ray 12/18/2013  Abdominal x-ray 12/19/2013   Consultations:  None  Discharge Exam: Filed Vitals:   12/24/13 0443  BP: 161/69  Pulse: 83  Temp: 97.9 F (36.6 C)  Resp: 16    General: NAD Cardiovascular: RRR Respiratory: CTAB  Discharge Instructions You were cared for by a hospitalist during your hospital stay. If you have any questions about your discharge medications or the care you received while you were in the hospital after you are discharged, you can call the unit and asked to speak with the hospitalist on call if the hospitalist that took care of you is not available. Once you are discharged, your primary care physician will handle any further medical issues. Please note that NO REFILLS for any discharge medications will be authorized once you are discharged, as it is imperative that you return to your primary care physician (or establish a relationship with  a primary care physician if you do not have one) for your aftercare needs so that they can reassess your need for medications and monitor your lab values.  Discharge Orders   Future Appointments Provider  Department Dept Phone   04/02/2014 12:00 PM Star Age, MD [facility] [phone]   Future Orders Complete By Expires   Diet general  As directed    Discharge instructions  As directed    Increase activity slowly  As directed        Medication List    STOP taking these medications       azithromycin 250 MG tablet  Commonly known as:  ZITHROMAX      TAKE these medications       amLODipine 2.5 MG tablet  Commonly known as:  NORVASC  Take 1 tablet (2.5 mg total) by mouth daily.     amoxicillin-clavulanate 500-125 MG per tablet  Commonly known as:  AUGMENTIN  Take 1 tablet (500 mg total) by mouth every 12 (twelve) hours. Take for 3 days then stop.     aspirin EC 81 MG tablet  Take 81 mg by mouth daily.     atorvastatin 20 MG tablet  Commonly known as:  LIPITOR  Take 20 mg by mouth daily.     calcium carbonate 200 MG capsule  Take 600 mg by mouth daily. Take at night     LORazepam 0.5 MG tablet  Commonly known as:  ATIVAN  Take 1 tablet (0.5 mg total) by mouth every 8 (eight) hours as needed for anxiety.     Memantine HCl ER 7 MG Cp24  Commonly known as:  NAMENDA XR  Take 1 capsule (7 mg total) by mouth daily. To be used with free 30 day trial offer.     multivitamin with minerals Tabs tablet  Take 1 tablet by mouth daily.     oxybutynin 10 MG 24 hr tablet  Commonly known as:  DITROPAN-XL  Take 10 mg by mouth daily.     polyethylene glycol packet  Commonly known as:  MIRALAX / GLYCOLAX  Take 17 g by mouth daily.     sertraline 50 MG tablet  Commonly known as:  ZOLOFT  Take 50 mg by mouth daily.       Allergies  Allergen Reactions  . Iohexol Rash  . Ciprofloxacin Other (See Comments)    weakness  . Nsaids     Renal insufficiency  . Sulfa Antibiotics Hives and Itching  . Ivp Dye [Iodinated Diagnostic Agents] Rash       Follow-up Information   Please follow up. (f/u with MD at SNF)        The results of significant  diagnostics from this hospitalization (including imaging, microbiology, ancillary and laboratory) are listed below for reference.    Significant Diagnostic Studies: Dg Chest 2 View  12/19/2013   CLINICAL DATA:  Emesis, shortness of breath and diarrhea.  EXAM: CHEST  2 VIEW  COMPARISON:  07/18/2007 and 12/14/2009  FINDINGS: Lungs are hypoinflated demonstrate bibasilar airspace opacification left worse than right likely infection. Possible small amount left pleural fluid. Cardiomediastinal silhouette and remainder of the exam is unchanged.  IMPRESSION: Bibasilar airspace process left worse than right likely infection. Possible small amount of left pleural fluid.   Electronically Signed   By: Marin Olp M.D.   On: 12/19/2013 00:15   Dg Abd 1 View  12/19/2013   CLINICAL DATA:  Nausea and vomiting  EXAM: ABDOMEN -  1 VIEW  COMPARISON:  None.  FINDINGS: Moderate volume of formed stool, especially in the distal colon. No evidence of bowel obstruction. No abnormal intra-abdominal mass effect or calcification. Lower abdominal and right pelvic surgical clips. Apparent L1 body compression is likely projectional.  IMPRESSION: Nonobstructive bowel gas pattern.   Electronically Signed   By: Jorje Guild M.D.   On: 12/19/2013 04:15    Microbiology: Recent Results (from the past 240 hour(s))  URINE CULTURE     Status: None   Collection Time    12/19/13 12:12 AM      Result Value Ref Range Status   Specimen Description URINE, RANDOM   Final   Special Requests NONE   Final   Culture  Setup Time     Final   Value: 12/19/2013 01:25     Performed at [facility]     Final   Value: NO GROWTH     Performed at [facility]   Culture     Final   Value: NO GROWTH     Performed at [facility]   Report Status 12/20/2013 FINAL   Final  MRSA PCR SCREENING     Status: None   Collection Time    12/19/13  3:04 PM      Result Value Ref Range Status   MRSA by PCR NEGATIVE   NEGATIVE Final   Comment:            The GeneXpert MRSA Assay (FDA     approved for NASAL specimens     only), is one component of a     comprehensive MRSA colonization     surveillance program. It is not     intended to diagnose MRSA     infection nor to guide or     monitor treatment for     MRSA infections.  CLOSTRIDIUM DIFFICILE BY PCR     Status: None   Collection Time    12/19/13 10:45 PM      Result Value Ref Range Status   C difficile by pcr NEGATIVE  NEGATIVE Final  STOOL CULTURE     Status: None   Collection Time    12/19/13 10:46 PM      Result Value Ref Range Status   Specimen Description STOOL   Final   Special Requests NONE   Final   Culture     Final   Value: NO SALMONELLA, SHIGELLA, CAMPYLOBACTER, YERSINIA, OR E.COLI 0157:H7 ISOLATED     Performed at [facility]   Report Status 12/23/2013 FINAL   Final     Labs: Basic Metabolic Panel:  Recent Labs Lab 12/19/13 1610 12/20/13 0729 12/21/13 0730 12/23/13 0709 12/24/13 0622  NA 140 143 141 141 141  K 3.9 3.3* 4.4 3.9 3.9  CL 102 106 106 102 106  CO2 24 24 22 22 22   GLUCOSE 80 89 101* 100* 111*  BUN 27* 19 12 25* 23  CREATININE 1.16* 1.24* 1.17* 1.53* 1.27*  CALCIUM 8.3* 7.9* 8.7 8.6 8.1*   Liver Function Tests:  Recent Labs Lab 12/18/13 2241 12/19/13 1610  AST 36 30  ALT 25 25  ALKPHOS 59 72  BILITOT 0.4 0.3  PROT 6.0 5.5*  ALBUMIN 3.1* 2.6*    Recent Labs Lab 12/18/13 2241  LIPASE 19   No results found for this basename: AMMONIA,  in the last 168 hours CBC:  Recent Labs Lab 12/18/13 2241 12/19/13 1610  12/20/13 0729 12/21/13 0730 12/23/13 0709  WBC 5.1 7.9 6.3 6.1 5.5  NEUTROABS 4.7 6.6  --   --   --   HGB 12.2 11.2* 11.1* 11.7* 11.7*  HCT 36.2 33.9* 32.6* 34.2* 34.1*  MCV 93.1 93.6 92.6 92.4 91.7  PLT 155 155 145* 172 207   Cardiac Enzymes: No results found for this basename: CKTOTAL, CKMB, CKMBINDEX, TROPONINI,  in the last 168 hours BNP: BNP (last 3  results) No results found for this basename: PROBNP,  in the last 8760 hours CBG: No results found for this basename: GLUCAP,  in the last 168 hours     Signed:  Eugenie Filler MD [facility] 12/24/2013, 11:35 AM

## 2013-12-24 NOTE — Care Management Note (Signed)
    Page 1 of 1   12/24/2013     11:58:03 AM   CARE MANAGEMENT NOTE 12/24/2013  Patient:  Alicia [name], Alicia [name]   Account Number:  [id number]  Date Initiated:  12/20/2013  Documentation initiated by:  [facility]  Subjective/Objective Assessment:   admitted with N/V/D, pneumonia     Action/Plan:   PT eval- recommended SNF   Anticipated DC Date:  12/25/2013   Anticipated DC Plan:  SKILLED NURSING FACILITY  In-house referral  Clinical Social Worker      DC Planning Services  CM consult      Choice offered to / List presented to:             Status of service:  Completed, signed off Medicare Important Message given?   (If response is "NO", the following Medicare IM given date fields will be blank) Date Medicare IM given:   Date Additional Medicare IM given:    Discharge Disposition:  [place]  Per UR Regulation:  Reviewed for med. necessity/level of care/duration of stay  If discussed at [place] of Stay Meetings, dates discussed:    Comments:

## 2013-12-24 NOTE — Clinical Social Work Placement (Signed)
Clinical Social Work Department CLINICAL SOCIAL WORK PLACEMENT NOTE 12/24/2013  Patient:  Alicia, [name]  Account Number:  [id number] Admit date:  12/18/2013  Clinical Social Worker:  Carrington Clamp, LCSWA  Date/time:  12/21/2013 04:26 PM  Clinical Social Work is seeking post-discharge placement for this patient at the following level of care:   [place]   (*CSW will update this form in Epic as items are completed)   12/21/2013  Patient/family provided with [place] Department of Clinical Social Work's list of facilities offering this level of care within the geographic area requested by the patient (or if unable, by the patient's family).  12/21/2013  Patient/family informed of their freedom to choose among providers that offer the needed level of care, that participate in Medicare, Medicaid or managed care program needed by the patient, have an available bed and are willing to accept the patient.  12/21/2013  Patient/family informed of [facility]' ownership interest in [facility], as well as of the fact that they are under no obligation to receive care at this facility.  PASARR submitted to EDS on  PASARR number received from [place] on   FL2 transmitted to all facilities in geographic area requested by pt/family on  12/21/2013 FL2 transmitted to all facilities within larger geographic area on   Patient informed that his/her managed care company has contracts with or will negotiate with  certain facilities, including the following:     Patient/family informed of bed offers received:  12/23/2013 Patient chooses bed at [place] [facility] Physician recommends and patient chooses bed at    Patient to be transferred to [facility] on  12/24/2013 Patient to be transferred to facility by Daughter in [place] personal vehicle.  The following physician request were entered in Epic:   Additional Comments:    Liz Beach MSW, [place], [place], [phone]

## 2013-12-24 NOTE — Clinical Social Work Note (Signed)
CSW met with patient and daughter in law at bedside. Daughter in law expresses concern about patient DC today to [facility]. Charge RN to page MD so that MD can address daughter in [place] concerns. CSW gave daughter in law patient's DC packet and instructed her to give packet to facility when she takes patient. CSW signing off at this time.  Liz Beach MSW, Tranell Wojtkiewicz, [place], [phone]

## 2013-12-24 NOTE — Clinical Social Work Note (Signed)
CSW has attempted to reach RN and notify that patient's daughter in law [name] is on her way to transport patient to [facility]. RN has been given number for report (on DC packet) and has been instructed to give daughter DC packet to give to facility. DC packet on patient's chart. Agricultural consultant notified.  Liz Beach MSW, [place], [place], [phone]

## 2013-12-25 ENCOUNTER — Inpatient Hospital Stay (HOSPITAL_COMMUNITY): Payer: Medicare Other

## 2013-12-25 MED ORDER — AMLODIPINE BESYLATE 10 MG PO TABS: 10.0000 mg | ORAL_TABLET | Freq: Every day | ORAL | Status: DC

## 2013-12-25 MED ORDER — AMLODIPINE BESYLATE 5 MG PO TABS
5.0000 mg | ORAL_TABLET | Freq: Every day | ORAL | Status: DC
  Administered 2013-12-25: 5 mg via ORAL
  Filled 2013-12-25: qty 1

## 2013-12-25 MED ORDER — AMLODIPINE BESYLATE 5 MG PO TABS: 5.0000 mg | ORAL_TABLET | Freq: Every day | ORAL | Status: DC

## 2013-12-25 MED ORDER — AMLODIPINE BESYLATE 5 MG PO TABS
5.0000 mg | ORAL_TABLET | Freq: Every day | ORAL | Status: DC
Start: 2013-12-25 — End: 2014-01-12

## 2013-12-25 NOTE — Clinical Social Work Placement (Signed)
Clinical Social Work Department CLINICAL SOCIAL WORK PLACEMENT NOTE 12/25/2013  Patient:  Alicia [name], Alicia [name]  Account Number:  [id number] Admit date:  12/18/2013  Clinical Social Worker:  Carrington Clamp, LCSWA  Date/time:  12/21/2013 04:26 PM  Clinical Social Work is seeking post-discharge placement for this patient at the following level of care:   [place]   (*CSW will update this form in Epic as items are completed)   12/21/2013  Patient/family provided with [place] Department of Clinical Social Work's list of facilities offering this level of care within the geographic area requested by the patient (or if unable, by the patient's family).  12/21/2013  Patient/family informed of their freedom to choose among providers that offer the needed level of care, that participate in Medicare, Medicaid or managed care program needed by the patient, have an available bed and are willing to accept the patient.  12/21/2013  Patient/family informed of [facility]' ownership interest in [facility], as well as of the fact that they are under no obligation to receive care at this facility.  PASARR submitted to EDS on  PASARR number received from [place] on   FL2 transmitted to all facilities in geographic area requested by pt/family on  12/21/2013 FL2 transmitted to all facilities within larger geographic area on   Patient informed that his/her managed care company has contracts with or will negotiate with  certain facilities, including the following:     Patient/family informed of bed offers received:  12/23/2013 Patient chooses bed at [place] Physician recommends and patient chooses bed at    Patient to be transferred to [facility] on  12/25/2013 Patient to be transferred to facility by Daughter in [place] personal vehicle.  The following physician request were entered in Epic:   Additional Comments: Per MD patient ready to DC. RN, patient's family, and  facilty aware of DC. RN given number for report. DC packet given to daughter who will transport patient. CSW signing off at this time.   Liz Beach MSW, [place], [place], [phone]

## 2013-12-25 NOTE — Discharge Summary (Signed)
Physician Discharge Summary  Alicia [name] NWG:[id number] DOB: Aug 15, 1923 DOA: 12/18/2013  PCP: Shellia Carwin, PA-C  Admit date: 12/18/2013 Discharge date: 12/25/2013  Time spent: 60 minutes  Recommendations for Outpatient Follow-up #1 followup with M.D. at the skilled nursing facility.  Discharge Diagnoses:    Pneumonia   Memory loss   Essential hypertension, benign   Nausea & vomiting   HLD (hyperlipidemia)   Discharge Condition: Stable and improved  Diet recommendation: Regular  Filed Weights   12/19/13 1420  Weight: 58.695 kg (129 lb 6.4 oz)    History of present illness:  Alicia [name] is a 81 y.o. female with history of dementia, hypertension, hyperlipidemia, chronic kidney disease, history of ovarian cancer status post surgery was brought to the ER after patient was found to have multiple episodes of nausea and vomiting. As per the daughter patient did not have diarrhea. In the ER patient was found to be febrile with temperatures around 101.15F. Chest x-ray shows infiltrates concerning for pneumonia. Patient has been admitted for further workup. Patient is a somnolent but arousable denies any pain. Abdominal exam is benign. Patient was just placed in the facility on Friday as patient's daughter was traveling. She usually stays with her daughter at her house. Patient has history of recurrent syncope for which patient has loop recorder and is being followed by Dr. Wynonia Lawman and Dr. Rayann Heman, cardiologist. Patient was recently started on Namenda few weeks ago after discontinuing Aricept due to bradycardia.   Hospital Course:  #1 probable aspiration pneumonia versus community-acquired pneumonia  Per chest x-ray. Patient was admitted and started empirically on IV vancomycin and IV Zosyn. Patient remained afebrile. Influenza PCR which was done was negative. Patient improved clinically and was subsequently transitioned to oral Augmentin and will be discharged on 3 more days of  oral Augmentin to complete a one-week course of antibiotic therapy. Patient will followup with M.D. at the skilled nursing facility.  #2 nausea/ vomiting  Improved. Abdominal x-ray negative for any obstruction. Patient also noted to have moderate amount of stool. Patient was given a soapsuds enema. Patient did not have any further nausea or vomiting for the rest of the hospitalization. Patient's diet was advanced and she tolerated a solid diet.  #3 hypertension  Continued on Norvasc 5 mg daily. Adjust medication as needed.  #4 dementia  Continued on Namenda.  #5 chronic kidney disease  Stable.  #6 history of recurrent syncope  Presently on a loop recorder. No episodes during the hospitalization. Being followed by cardiology as outpatient.  #7 hyperlipidemia  Continued on Lipitor. 8-AMS; notice by daughter in law to be lethargic, headaches. Patient said I don't feel well. BP was elevated. Will order MRI to rule out stroke. If MRI negative will plan to transfer to SNF. Patient denies headaches. Patient is alert, following commands.   Procedures: Chest x-ray 12/18/2013  Abdominal x-ray 12/19/2013   Consultations:  None  Discharge Exam: Filed Vitals:   12/25/13 0820  BP: 165/62  Pulse:   Temp:   Resp:     General: NAD Cardiovascular: RRR Respiratory: CTAB  Discharge Instructions You were cared for by a hospitalist during your hospital stay. If you have any questions about your discharge medications or the care you received while you were in the hospital after you are discharged, you can call the unit and asked to speak with the hospitalist on call if the hospitalist that took care of you is not available. Once you are discharged, your primary care  physician will handle any further medical issues. Please note that NO REFILLS for any discharge medications will be authorized once you are discharged, as it is imperative that you return to your primary care physician (or establish a  relationship with a primary care physician if you do not have one) for your aftercare needs so that they can reassess your need for medications and monitor your lab values.      Discharge Orders   Future Appointments Provider Department Dept Phone   04/02/2014 12:00 PM Star Age, MD [facility] [phone]   Future Orders Complete By Expires   Diet general  As directed    Discharge instructions  As directed    Increase activity slowly  As directed        Medication List    STOP taking these medications       azithromycin 250 MG tablet  Commonly known as:  ZITHROMAX      TAKE these medications       amLODipine 2.5 MG tablet  Commonly known as:  NORVASC  Take 1 tablet (2.5 mg total) by mouth daily.     amoxicillin-clavulanate 500-125 MG per tablet  Commonly known as:  AUGMENTIN  Take 1 tablet (500 mg total) by mouth every 12 (twelve) hours. Take for 3 days then stop.     aspirin EC 81 MG tablet  Take 81 mg by mouth daily.     atorvastatin 20 MG tablet  Commonly known as:  LIPITOR  Take 20 mg by mouth daily.     calcium carbonate 200 MG capsule  Take 600 mg by mouth daily. Take at night     LORazepam 0.5 MG tablet  Commonly known as:  ATIVAN  Take 1 tablet (0.5 mg total) by mouth every 8 (eight) hours as needed for anxiety.     Memantine HCl ER 7 MG Cp24  Commonly known as:  NAMENDA XR  Take 1 capsule (7 mg total) by mouth daily. To be used with free 30 day trial offer.     multivitamin with minerals Tabs tablet  Take 1 tablet by mouth daily.     oxybutynin 10 MG 24 hr tablet  Commonly known as:  DITROPAN-XL  Take 10 mg by mouth daily.     polyethylene glycol packet  Commonly known as:  MIRALAX / GLYCOLAX  Take 17 g by mouth daily.     sertraline 50 MG tablet  Commonly known as:  ZOLOFT  Take 50 mg by mouth daily.       Allergies  Allergen Reactions  . Iohexol Rash  . Ciprofloxacin Other (See Comments)    weakness  . Nsaids      Renal insufficiency  . Sulfa Antibiotics Hives and Itching  . Ivp Dye [Iodinated Diagnostic Agents] Rash   Follow-up Information   Please follow up. (f/u with MD at SNF)        The results of significant diagnostics from this hospitalization (including imaging, microbiology, ancillary and laboratory) are listed below for reference.    Significant Diagnostic Studies: Dg Chest 2 View  12/19/2013   CLINICAL DATA:  Emesis, shortness of breath and diarrhea.  EXAM: CHEST  2 VIEW  COMPARISON:  07/18/2007 and 12/14/2009  FINDINGS: Lungs are hypoinflated demonstrate bibasilar airspace opacification left worse than right likely infection. Possible small amount left pleural fluid. Cardiomediastinal silhouette and remainder of the exam is unchanged.  IMPRESSION: Bibasilar airspace process left worse than right likely infection. Possible small amount  of left pleural fluid.   Electronically Signed   By: Marin Olp M.D.   On: 12/19/2013 00:15   Dg Abd 1 View  12/19/2013   CLINICAL DATA:  Nausea and vomiting  EXAM: ABDOMEN - 1 VIEW  COMPARISON:  None.  FINDINGS: Moderate volume of formed stool, especially in the distal colon. No evidence of bowel obstruction. No abnormal intra-abdominal mass effect or calcification. Lower abdominal and right pelvic surgical clips. Apparent L1 body compression is likely projectional.  IMPRESSION: Nonobstructive bowel gas pattern.   Electronically Signed   By: Jorje Guild M.D.   On: 12/19/2013 04:15    Microbiology: Recent Results (from the past 240 hour(s))  URINE CULTURE     Status: None   Collection Time    12/19/13 12:12 AM      Result Value Ref Range Status   Specimen Description URINE, RANDOM   Final   Special Requests NONE   Final   Culture  Setup Time     Final   Value: 12/19/2013 01:25     Performed at [facility]     Final   Value: NO GROWTH     Performed at [facility]   Culture     Final   Value: NO GROWTH      Performed at [facility]   Report Status 12/20/2013 FINAL   Final  MRSA PCR SCREENING     Status: None   Collection Time    12/19/13  3:04 PM      Result Value Ref Range Status   MRSA by PCR NEGATIVE  NEGATIVE Final   Comment:            The GeneXpert MRSA Assay (FDA     approved for NASAL specimens     only), is one component of a     comprehensive MRSA colonization     surveillance program. It is not     intended to diagnose MRSA     infection nor to guide or     monitor treatment for     MRSA infections.  CLOSTRIDIUM DIFFICILE BY PCR     Status: None   Collection Time    12/19/13 10:45 PM      Result Value Ref Range Status   C difficile by pcr NEGATIVE  NEGATIVE Final  STOOL CULTURE     Status: None   Collection Time    12/19/13 10:46 PM      Result Value Ref Range Status   Specimen Description STOOL   Final   Special Requests NONE   Final   Culture     Final   Value: NO SALMONELLA, SHIGELLA, CAMPYLOBACTER, YERSINIA, OR E.COLI 0157:H7 ISOLATED     Performed at [facility]   Report Status 12/23/2013 FINAL   Final     Labs: Basic Metabolic Panel:  Recent Labs Lab 12/19/13 1610 12/20/13 0729 12/21/13 0730 12/23/13 0709 12/24/13 0622  NA 140 143 141 141 141  K 3.9 3.3* 4.4 3.9 3.9  CL 102 106 106 102 106  CO2 24 24 22 22 22   GLUCOSE 80 89 101* 100* 111*  BUN 27* 19 12 25* 23  CREATININE 1.16* 1.24* 1.17* 1.53* 1.27*  CALCIUM 8.3* 7.9* 8.7 8.6 8.1*   Liver Function Tests:  Recent Labs Lab 12/18/13 2241 12/19/13 1610  AST 36 30  ALT 25 25  ALKPHOS 59 72  BILITOT 0.4 0.3  PROT 6.0  5.5*  ALBUMIN 3.1* 2.6*    Recent Labs Lab 12/18/13 2241  LIPASE 19   No results found for this basename: AMMONIA,  in the last 168 hours CBC:  Recent Labs Lab 12/18/13 2241 12/19/13 1610 12/20/13 0729 12/21/13 0730 12/23/13 0709  WBC 5.1 7.9 6.3 6.1 5.5  NEUTROABS 4.7 6.6  --   --   --   HGB 12.2 11.2* 11.1* 11.7* 11.7*  HCT 36.2 33.9* 32.6*  34.2* 34.1*  MCV 93.1 93.6 92.6 92.4 91.7  PLT 155 155 145* 172 207   Cardiac Enzymes: No results found for this basename: CKTOTAL, CKMB, CKMBINDEX, TROPONINI,  in the last 168 hours BNP: BNP (last 3 results) No results found for this basename: PROBNP,  in the last 8760 hours CBG: No results found for this basename: GLUCAP,  in the last 168 hours     Signed:  Elmarie Shiley MD [facility] 12/25/2013, 10:52 AM

## 2013-12-26 ENCOUNTER — Encounter: Payer: Self-pay | Admitting: *Deleted

## 2014-01-08 ENCOUNTER — Non-Acute Institutional Stay (SKILLED_NURSING_FACILITY): Payer: Medicare Other | Admitting: Adult Health

## 2014-01-08 DIAGNOSIS — J189 Pneumonia, unspecified organism: Secondary | ICD-10-CM

## 2014-01-08 DIAGNOSIS — F0390 Unspecified dementia without behavioral disturbance: Secondary | ICD-10-CM

## 2014-01-08 DIAGNOSIS — F329 Major depressive disorder, single episode, unspecified: Secondary | ICD-10-CM

## 2014-01-08 DIAGNOSIS — R55 Syncope and collapse: Secondary | ICD-10-CM

## 2014-01-08 DIAGNOSIS — E785 Hyperlipidemia, unspecified: Secondary | ICD-10-CM

## 2014-01-08 DIAGNOSIS — F411 Generalized anxiety disorder: Secondary | ICD-10-CM

## 2014-01-08 DIAGNOSIS — I10 Essential (primary) hypertension: Secondary | ICD-10-CM

## 2014-01-08 DIAGNOSIS — F3289 Other specified depressive episodes: Secondary | ICD-10-CM

## 2014-01-08 DIAGNOSIS — F32A Depression, unspecified: Secondary | ICD-10-CM

## 2014-01-09 ENCOUNTER — Encounter: Payer: Self-pay | Admitting: Adult Health

## 2014-01-09 DIAGNOSIS — F0390 Unspecified dementia without behavioral disturbance: Secondary | ICD-10-CM | POA: Insufficient documentation

## 2014-01-09 NOTE — Progress Notes (Signed)
Patient ID: Alicia [name], female   DOB: 1923/08/26, 80 y.o.   MRN: [id number]              PROGRESS NOTE  DATE: 01/08/2014   FACILITY: [facility]  LEVEL OF CARE: SNF (31)  Acute Visit  CHIEF COMPLAINT:  Discharge Notes  HISTORY OF PRESENT ILLNESS: This is an 80 year old female who is for discharge home with Home health PT, OT and Nursing. DME: Rolling walker and bedside commode. She has been admitted to [facility] on 12/25/13 from [facility] with Pneumonia which is now resolved. Patient was admitted to this facility for short-term rehabilitation after the patient's recent hospitalization.  Patient has completed SNF rehabilitation and therapy has cleared the patient for discharge.  Reassessment of ongoing problem(s):  HTN: Pt 's HTN remains stable.  Denies CP, sob, DOE, pedal edema, headaches, dizziness or visual disturbances.  No complications from the medications currently being used.  Last BP : 110/80  DEPRESSION: The depression remains stable. Patient denies ongoing feelings of sadness, insomnia, anedhonia or lack of appetite. No complications reported from the medications currently being used. Staff do not report behavioral problems.  DEMENTIA: The dementia remaines stable and continues to function adequately in the current living environment with supervision.  The patient has had little changes in behavior. No complications noted from the medications presently being used.  PAST MEDICAL HISTORY : Reviewed.  No changes/see problem list  CURRENT MEDICATIONS: Reviewed per MAR/see medication list  REVIEW OF SYSTEMS: unable to obtain due to being a poor historian  PHYSICAL EXAMINATION  GENERAL: no acute distress, normal body habitus EYES: conjunctivae normal, sclerae normal, normal eye lids NECK: supple, trachea midline, no neck masses, no thyroid tenderness, no thyromegaly LYMPHATICS: no LAN in the neck, no supraclavicular LAN RESPIRATORY:  breathing is even & unlabored, BS CTAB CARDIAC: RRR, no murmur,no extra heart sounds, no edema GI: abdomen soft, normal BS, no masses, no tenderness, no hepatomegaly, no splenomegaly PSYCHIATRIC: the patient is alert & oriented to person, affect & behavior appropriate  LABS/RADIOLOGY: Labs reviewed: Basic Metabolic Panel:  Recent Labs  12/21/13 0730 12/23/13 0709 12/24/13 0622  NA 141 141 141  K 4.4 3.9 3.9  CL 106 102 106  CO2 22 22 22   GLUCOSE 101* 100* 111*  BUN 12 25* 23  CREATININE 1.17* 1.53* 1.27*  CALCIUM 8.7 8.6 8.1*   Liver Function Tests:  Recent Labs  12/18/13 2241 12/19/13 1610  AST 36 30  ALT 25 25  ALKPHOS 59 72  BILITOT 0.4 0.3  PROT 6.0 5.5*  ALBUMIN 3.1* 2.6*    Recent Labs  12/18/13 2241  LIPASE 19    CBC:  Recent Labs  12/18/13 2241 12/19/13 1610 12/20/13 0729 12/21/13 0730 12/23/13 0709  WBC 5.1 7.9 6.3 6.1 5.5  NEUTROABS 4.7 6.6  --   --   --   HGB 12.2 11.2* 11.1* 11.7* 11.7*  HCT 36.2 33.9* 32.6* 34.2* 34.1*  MCV 93.1 93.6 92.6 92.4 91.7  PLT 155 155 145* 172 207    ASSESSMENT/PLAN:  Pneumonia - Resolved Hypertension - well-controlled; continue Norvasc Depression - continue Zoloft Anxiety - continue Ativan when necessary Hyperlipidemia - continue Lipitor Recurrent syncope - has a loop recorder; followed-up by cardiologist as outpatient Dementia - continue Namenda    I have filled out patient's discharge paperwork and written prescriptions.  Patient will receive home health PT, OT and Nursing.  DME provided: Rolling walker and bedside commode  Total discharge time: Greater than 30 minutes Discharge time involved coordination of the discharge process with social worker, nursing staff and therapy department. Medical justification for home health services/DME verified.  CPT CODE: 27035  Seth Bake - NP [facility] [phone]

## 2014-01-12 ENCOUNTER — Emergency Department (HOSPITAL_COMMUNITY)
Admission: EM | Admit: 2014-01-12 | Discharge: 2014-01-12 | Disposition: A | Discharge status: Home / Self Care | Payer: Medicare Other | Attending: Emergency Medicine | Admitting: Emergency Medicine

## 2014-01-12 ENCOUNTER — Emergency Department (HOSPITAL_COMMUNITY): Payer: Medicare Other

## 2014-01-12 ENCOUNTER — Encounter (HOSPITAL_COMMUNITY): Payer: Self-pay | Admitting: Emergency Medicine

## 2014-01-12 DIAGNOSIS — F3289 Other specified depressive episodes: Secondary | ICD-10-CM | POA: Insufficient documentation

## 2014-01-12 DIAGNOSIS — Z8543 Personal history of malignant neoplasm of ovary: Secondary | ICD-10-CM | POA: Insufficient documentation

## 2014-01-12 DIAGNOSIS — D649 Anemia, unspecified: Secondary | ICD-10-CM | POA: Insufficient documentation

## 2014-01-12 DIAGNOSIS — Z8781 Personal history of (healed) traumatic fracture: Secondary | ICD-10-CM | POA: Insufficient documentation

## 2014-01-12 DIAGNOSIS — Z9849 Cataract extraction status, unspecified eye: Secondary | ICD-10-CM | POA: Insufficient documentation

## 2014-01-12 DIAGNOSIS — Z7982 Long term (current) use of aspirin: Secondary | ICD-10-CM | POA: Insufficient documentation

## 2014-01-12 DIAGNOSIS — Z79899 Other long term (current) drug therapy: Secondary | ICD-10-CM | POA: Insufficient documentation

## 2014-01-12 DIAGNOSIS — F329 Major depressive disorder, single episode, unspecified: Secondary | ICD-10-CM | POA: Insufficient documentation

## 2014-01-12 DIAGNOSIS — Z9889 Other specified postprocedural states: Secondary | ICD-10-CM | POA: Insufficient documentation

## 2014-01-12 DIAGNOSIS — N390 Urinary tract infection, site not specified: Secondary | ICD-10-CM

## 2014-01-12 DIAGNOSIS — F0390 Unspecified dementia without behavioral disturbance: Secondary | ICD-10-CM

## 2014-01-12 DIAGNOSIS — E785 Hyperlipidemia, unspecified: Secondary | ICD-10-CM | POA: Insufficient documentation

## 2014-01-12 DIAGNOSIS — R55 Syncope and collapse: Secondary | ICD-10-CM

## 2014-01-12 DIAGNOSIS — I10 Essential (primary) hypertension: Secondary | ICD-10-CM

## 2014-01-12 DIAGNOSIS — N189 Chronic kidney disease, unspecified: Secondary | ICD-10-CM | POA: Insufficient documentation

## 2014-01-12 DIAGNOSIS — F411 Generalized anxiety disorder: Secondary | ICD-10-CM | POA: Insufficient documentation

## 2014-01-12 DIAGNOSIS — I129 Hypertensive chronic kidney disease with stage 1 through stage 4 chronic kidney disease, or unspecified chronic kidney disease: Secondary | ICD-10-CM | POA: Insufficient documentation

## 2014-01-12 DIAGNOSIS — I2510 Atherosclerotic heart disease of native coronary artery without angina pectoris: Secondary | ICD-10-CM | POA: Insufficient documentation

## 2014-01-12 LAB — PROTIME-INR
INR: 0.95 (ref 0.00–1.49)
PROTHROMBIN TIME: 12.5 s (ref 11.6–15.2)

## 2014-01-12 LAB — URINALYSIS, ROUTINE W REFLEX MICROSCOPIC
BILIRUBIN URINE: NEGATIVE
Glucose, UA: NEGATIVE mg/dL
Ketones, ur: NEGATIVE mg/dL
NITRITE: NEGATIVE
PH: 5.5 (ref 5.0–8.0)
Protein, ur: NEGATIVE mg/dL
Specific Gravity, Urine: 1.022 (ref 1.005–1.030)
Urobilinogen, UA: 0.2 mg/dL (ref 0.0–1.0)

## 2014-01-12 LAB — CBC WITH DIFFERENTIAL/PLATELET
Basophils Absolute: 0 10*3/uL (ref 0.0–0.1)
Basophils Relative: 0 % (ref 0–1)
Eosinophils Absolute: 0.1 10*3/uL (ref 0.0–0.7)
Eosinophils Relative: 1 % (ref 0–5)
HCT: 40.2 % (ref 36.0–46.0)
Hemoglobin: 9.2 g/dL — ABNORMAL LOW (ref 12.0–15.0)
Lymphocytes Relative: 11 % — ABNORMAL LOW (ref 12–46)
Lymphs Abs: 1 K/uL (ref 0.7–4.0)
MCH: 21.7 pg — ABNORMAL LOW (ref 26.0–34.0)
MCHC: 22.9 g/dL — ABNORMAL LOW (ref 30.0–36.0)
MCV: 95 fL (ref 78.0–100.0)
Monocytes Absolute: 0.4 10*3/uL (ref 0.1–1.0)
Monocytes Relative: 4 % (ref 3–12)
Neutro Abs: 7.3 10*3/uL (ref 1.7–7.7)
Neutrophils Relative %: 84 % — ABNORMAL HIGH (ref 43–77)
Platelets: ADEQUATE 10*3/uL (ref 150–400)
RBC: 4.23 MIL/uL (ref 3.87–5.11)
RDW: 15 % (ref 11.5–15.5)
WBC: 8.8 10*3/uL (ref 4.0–10.5)

## 2014-01-12 LAB — BASIC METABOLIC PANEL
BUN: 33 mg/dL — ABNORMAL HIGH (ref 6–23)
Calcium: 9.4 mg/dL (ref 8.4–10.5)
Chloride: 100 mEq/L (ref 96–112)
GFR calc Af Amer: 42 mL/min — ABNORMAL LOW (ref >90)
GFR calc non Af Amer: 37 mL/min — ABNORMAL LOW (ref >90)
Glucose, Bld: 99 mg/dL (ref 70–99)
Sodium: 138 mEq/L (ref 137–147)

## 2014-01-12 LAB — URINE MICROSCOPIC-ADD ON

## 2014-01-12 LAB — CBG MONITORING, ED: Glucose-Capillary: 89 mg/dL (ref 70–99)

## 2014-01-12 LAB — BASIC METABOLIC PANEL WITH GFR
CO2: 21 meq/L (ref 19–32)
Creatinine, Ser: 1.27 mg/dL — ABNORMAL HIGH (ref 0.50–1.10)
Potassium: 5 meq/L (ref 3.7–5.3)

## 2014-01-12 LAB — APTT: aPTT: 26 seconds (ref 24–37)

## 2014-01-12 LAB — TROPONIN I: Troponin I: <0.3 ng/mL (ref <0.30)

## 2014-01-12 MED ORDER — DEXTROSE 5 % IV SOLN
1.0000 g | Freq: Once | INTRAVENOUS | Status: AC
  Administered 2014-01-12: 1 g via INTRAVENOUS
  Filled 2014-01-12: qty 10

## 2014-01-12 MED ORDER — SODIUM CHLORIDE 0.9 % IV SOLN
Freq: Once | INTRAVENOUS | Status: AC
  Administered 2014-01-12: 17:00:00 via INTRAVENOUS

## 2014-01-12 MED ORDER — CEPHALEXIN 500 MG PO CAPS: 500.0000 mg | ORAL_CAPSULE | Freq: Two times a day (BID) | ORAL | Status: DC

## 2014-01-12 NOTE — ED Notes (Signed)
Pt had syncopal episode while sitting in church. Pt has hx of episodes around 9 times. BP-140/65 HR-72 regular. Resp 16 98%RA. cbg 121. 12 lead unremarkable. Pt has monitor that is internal "loop monitor" to try to catch episodes. Pt stays at [facility].

## 2014-01-12 NOTE — ED Notes (Signed)
POCT occult stool culture came back negative. Results are not crossing over in computer system

## 2014-01-12 NOTE — ED Notes (Signed)
Family at bedside. States patient has been "disconnected" recently. Reports increase episodes of passing out and has been more lethargic recently.

## 2014-01-12 NOTE — Discharge Instructions (Signed)
Take your antibiotics as directed and to completion. You should never have any leftover antibiotics! Push fluids and stay well hydrated.   Please follow with your primary care doctor in the next 2 days for a check-up. They must obtain records for further management.   Do not hesitate to return to the Emergency Department for any new, worsening or concerning symptoms.    Urinary Tract Infection Urinary tract infections (UTIs) can develop anywhere along your urinary tract. Your urinary tract is your body's drainage system for removing wastes and extra water. Your urinary tract includes two kidneys, two ureters, a bladder, and a urethra. Your kidneys are a pair of bean-shaped organs. Each kidney is about the size of your fist. They are located below your ribs, one on each side of your spine. CAUSES Infections are caused by microbes, which are microscopic organisms, including fungi, viruses, and bacteria. These organisms are so small that they can only be seen through a microscope. Bacteria are the microbes that most commonly cause UTIs. SYMPTOMS  Symptoms of UTIs may vary by age and gender of the patient and by the location of the infection. Symptoms in young women typically include a frequent and intense urge to urinate and a painful, burning feeling in the bladder or urethra during urination. Older women and men are more likely to be tired, shaky, and weak and have muscle aches and abdominal pain. A fever may mean the infection is in your kidneys. Other symptoms of a kidney infection include pain in your back or sides below the ribs, nausea, and vomiting. DIAGNOSIS To diagnose a UTI, your caregiver will ask you about your symptoms. Your caregiver also will ask to provide a urine sample. The urine sample will be tested for bacteria and white blood cells. White blood cells are made by your body to help fight infection. TREATMENT  Typically, UTIs can be treated with medication. Because most UTIs are  caused by a bacterial infection, they usually can be treated with the use of antibiotics. The choice of antibiotic and length of treatment depend on your symptoms and the type of bacteria causing your infection. HOME CARE INSTRUCTIONS  If you were prescribed antibiotics, take them exactly as your caregiver instructs you. Finish the medication even if you feel better after you have only taken some of the medication.  Drink enough water and fluids to keep your urine clear or pale yellow.  Avoid caffeine, tea, and carbonated beverages. They tend to irritate your bladder.  Empty your bladder often. Avoid holding urine for long periods of time.  Empty your bladder before and after sexual intercourse.  After a bowel movement, women should cleanse from front to back. Use each tissue only once. SEEK MEDICAL CARE IF:   You have back pain.  You develop a fever.  Your symptoms do not begin to resolve within 3 days. SEEK IMMEDIATE MEDICAL CARE IF:   You have severe back pain or lower abdominal pain.  You develop chills.  You have nausea or vomiting.  You have continued burning or discomfort with urination. MAKE SURE YOU:   Understand these instructions.  Will watch your condition.  Will get help right away if you are not doing well or get worse. Document Released: 06/08/2005 Document Revised: 02/28/2012 Document Reviewed: 10/07/2011 [facility] Patient Information 2014 [place].  Syncope Syncope is a fainting spell. This means the person loses consciousness and drops to the ground. The person is generally unconscious for less than 5 minutes. The person  may have some muscle twitches for up to 15 seconds before waking up and returning to normal. Syncope occurs more often in elderly people, but it can happen to anyone. While most causes of syncope are not dangerous, syncope can be a sign of a serious medical problem. It is important to seek medical care.  CAUSES  Syncope is caused by  a sudden decrease in blood flow to the brain. The specific cause is often not determined. Factors that can trigger syncope include:  Taking medicines that lower blood pressure.  Sudden changes in posture, such as standing up suddenly.  Taking more medicine than prescribed.  Standing in one place for too long.  Seizure disorders.  Dehydration and excessive exposure to heat.  Low blood sugar (hypoglycemia).  Straining to have a bowel movement.  Heart disease, irregular heartbeat, or other circulatory problems.  Fear, emotional distress, seeing blood, or severe pain. SYMPTOMS  Right before fainting, you may:  Feel dizzy or lightheaded.  Feel nauseous.  See all white or all black in your field of vision.  Have cold, clammy skin. DIAGNOSIS  Your caregiver will ask about your symptoms, perform a physical exam, and perform electrocardiography (ECG) to record the electrical activity of your heart. Your caregiver may also perform other heart or blood tests to determine the cause of your syncope. TREATMENT  In most cases, no treatment is needed. Depending on the cause of your syncope, your caregiver may recommend changing or stopping some of your medicines. HOME CARE INSTRUCTIONS  Have someone stay with you until you feel stable.  Do not drive, operate machinery, or play sports until your caregiver says it is okay.  Keep all follow-up appointments as directed by your caregiver.  Lie down right away if you start feeling like you might faint. Breathe deeply and steadily. Wait until all the symptoms have passed.  Drink enough fluids to keep your urine clear or pale yellow.  If you are taking blood pressure or heart medicine, get up slowly, taking several minutes to sit and then stand. This can reduce dizziness. SEEK IMMEDIATE MEDICAL CARE IF:   You have a severe headache.  You have unusual pain in the chest, abdomen, or back.  You are bleeding from the mouth or rectum, or you  have black or tarry stool.  You have an irregular or very fast heartbeat.  You have pain with breathing.  You have repeated fainting or seizure-like jerking during an episode.  You faint when sitting or lying down.  You have confusion.  You have difficulty walking.  You have severe weakness.  You have vision problems. If you fainted, call your local emergency services (911 in [place] Do not drive yourself to the hospital.  MAKE SURE YOU:  Understand these instructions.  Will watch your condition.  Will get help right away if you are not doing well or get worse. Document Released: 08/29/2005 Document Revised: 02/28/2012 Document Reviewed: 10/28/2011 [facility] Patient Information 2014 [place].

## 2014-01-12 NOTE — ED Provider Notes (Signed)
CSN: [id number]     Arrival date & time 01/12/14  1307 History   First MD Initiated Contact with Patient 01/12/14 1504     Chief Complaint  Patient presents with  . Near Syncope     (Consider location/radiation/quality/duration/timing/severity/associated sxs/prior Treatment) HPI  Alicia [name] is a 80 y.o. female dementia, HTN, HLD, CKD BIBEMS for syncopal event in church. Patient has loop recorder (Dr. Joylene Grapes) for multiple syncopal events. Normal sugar, EKG, vitals via EMS. Level V caveat secondary to dementia. As per RN and [facility]: Pt is intermittently verbal and confused but alert at her baseline.    Past Medical History  Diagnosis Date  . Dementia     significant  . Renal disease   . Memory loss 12/10/2012  . Depression 12/10/2012  . Anxiety state, unspecified 12/10/2012  . Spells 12/10/2012  . Essential hypertension, benign 12/10/2012  . Other and unspecified hyperlipidemia 12/10/2012  . Loss of weight 12/10/2012  . Carotid artery stenosis   . Ovarian cancer     treated with surgery & chemo  . Ankle fracture, right   . Unstable gait     mild  . Syncope 12/10/2012    Recurrent, sporadic episodes  . Carotid artery disease    Past Surgical History  Procedure Laterality Date  . Vesicovaginal fistula closure w/ tah  2005  . Oophorectomy  2007    for ovarian cancer  . Prolapsed uterine fibroid ligation    . Appendectomy    . Cataract extraction Bilateral   . Implantable loop recorder  11/14    MDT LINQ implanted for recurrent unexplained syncope by Dr Rayann Heman  . Abdominal hysterectomy     Family History  Problem Relation Age of Onset  . Cancer Mother     breast  . Stroke Father    History  Substance Use Topics  . Smoking status: Never Smoker   . Smokeless tobacco: Not on file  . Alcohol Use: No   OB History   Grav Para Term Preterm Abortions TAB SAB Ect Mult Living                 Review of Systems  Unable to perform ROS: Dementia    Allergies   Iohexol; Ciprofloxacin; Nsaids; Sulfa antibiotics; and Ivp dye  Home Medications   Prior to Admission medications   Medication Sig Start Date End Date Taking? Authorizing Provider  amLODipine (NORVASC) 2.5 MG tablet Take 2.5 mg by mouth daily.   Yes Historical Provider, MD  aspirin EC 81 MG tablet Take 81 mg by mouth daily.   Yes Historical Provider, MD  atorvastatin (LIPITOR) 20 MG tablet Take 20 mg by mouth daily.   Yes Historical Provider, MD  calcium carbonate 200 MG capsule Take 600 mg by mouth at bedtime. Take at night   Yes Historical Provider, MD  LORazepam (ATIVAN) 0.5 MG tablet Take 1 tablet (0.5 mg total) by mouth every 8 (eight) hours as needed for anxiety. 12/24/13  Yes Eugenie Filler, MD  Memantine HCl ER (NAMENDA XR) 7 MG CP24 Take 1 capsule (7 mg total) by mouth daily. To be used with free 30 day trial offer. 11/11/13  Yes Star Age, MD  Multiple Vitamin (MULTIVITAMIN WITH MINERALS) TABS Take 1 tablet by mouth daily.   Yes Historical Provider, MD  oxybutynin (DITROPAN-XL) 10 MG 24 hr tablet Take 10 mg by mouth daily.   Yes Historical Provider, MD  polyethylene glycol (MIRALAX / GLYCOLAX) packet Take 17  g by mouth daily. 12/24/13  Yes Eugenie Filler, MD  sertraline (ZOLOFT) 50 MG tablet Take 50 mg by mouth daily.   Yes Historical Provider, MD   BP 171/68  Pulse 67  Temp(Src) 98.8 F (37.1 C) (Oral)  Resp 15  SpO2 97% Physical Exam  Nursing note and vitals reviewed. Constitutional: She appears well-developed and well-nourished.  HENT:  Head: Normocephalic and atraumatic.  Eyes: Conjunctivae and EOM are normal. Pupils are equal, round, and reactive to light.  Neck: Normal range of motion. Neck supple.  No midline C-spine  tenderness to palpation or step-offs appreciated. Patient has full range of motion without pain.   Cardiovascular: Normal rate and regular rhythm.   Pulmonary/Chest: Effort normal and breath sounds normal. No stridor. No respiratory distress. She  has no wheezes. She has no rales. She exhibits no tenderness.  Abdominal: Soft. Bowel sounds are normal. She exhibits no distension and no mass. There is no tenderness. There is no rebound and no guarding.  Musculoskeletal: Normal range of motion. She exhibits no edema and no tenderness.  Pelvis stable, patient does not wince at long bone or joint palpation.  Neurological: She is alert.  Alert, pleasantly demented, intermittently verbal, able to follow simple commands like opening her mouth. Oriented to self only.  Skin: Skin is warm.  Psychiatric: She has a normal mood and affect.    ED Course  Procedures (including critical care time) Labs Review Labs Reviewed  CBC WITH DIFFERENTIAL - Abnormal; Notable for the following:    Hemoglobin 9.2 (*)    MCH 21.7 (*)    MCHC 22.9 (*)    Neutrophils Relative % 84 (*)    Lymphocytes Relative 11 (*)    All other components within normal limits  BASIC METABOLIC PANEL - Abnormal; Notable for the following:    BUN 33 (*)    Creatinine, Ser 1.27 (*)    GFR calc non Af Amer 37 (*)    GFR calc Af Amer 42 (*)    All other components within normal limits  URINALYSIS, ROUTINE W REFLEX MICROSCOPIC - Abnormal; Notable for the following:    APPearance HAZY (*)    Hgb urine dipstick LARGE (*)    Leukocytes, UA MODERATE (*)    All other components within normal limits  URINE MICROSCOPIC-ADD ON - Abnormal; Notable for the following:    Bacteria, UA MANY (*)    All other components within normal limits  URINE CULTURE  TROPONIN I  APTT  PROTIME-INR  POCT CBG (FASTING - GLUCOSE)-MANUAL ENTRY  CBG MONITORING, ED  POC OCCULT BLOOD, ED    Imaging Review Ct Head Wo Contrast  01/12/2014   CLINICAL DATA:  80 year old female with altered mental status and near syncope. Neck pain.  EXAM: CT HEAD WITHOUT CONTRAST  CT CERVICAL SPINE WITHOUT CONTRAST  TECHNIQUE: Multidetector CT imaging of the head and cervical spine was performed following the standard  protocol without intravenous contrast. Multiplanar CT image reconstructions of the cervical spine were also generated.  COMPARISON:  12/25/2013 brain MR. 05/20/2012 head and cervical spine CT.  FINDINGS: CT HEAD FINDINGS  Atrophy and chronic small-vessel white matter ischemic changes are again noted.  No acute intracranial abnormalities are identified, including mass lesion or mass effect, hydrocephalus, extra-axial fluid collection, midline shift, hemorrhage, or acute infarction. The visualized bony calvarium is unremarkable.  A small amount of fluid in the sphenoid sinuses again noted.  CT CERVICAL SPINE FINDINGS  Normal alignment again noted.  There is no evidence of acute fracture, subluxation or prevertebral soft tissue swelling.  Moderate to severe degenerative disc disease from C3-C6 again noted.  No focal bony lesions are identified.  The soft tissue structures are unremarkable.  IMPRESSION: No evidence of acute intracranial or acute cervical spine abnormality.  Atrophy and chronic small-vessel white matter ischemic changes.  Moderate to severe degenerative disc disease from C3-C6.   Electronically Signed   By: Hassan Rowan M.D.   On: 01/12/2014 17:15   Ct Cervical Spine Wo Contrast  01/12/2014   CLINICAL DATA:  80 year old female with altered mental status and near syncope. Neck pain.  EXAM: CT HEAD WITHOUT CONTRAST  CT CERVICAL SPINE WITHOUT CONTRAST  TECHNIQUE: Multidetector CT imaging of the head and cervical spine was performed following the standard protocol without intravenous contrast. Multiplanar CT image reconstructions of the cervical spine were also generated.  COMPARISON:  12/25/2013 brain MR. 05/20/2012 head and cervical spine CT.  FINDINGS: CT HEAD FINDINGS  Atrophy and chronic small-vessel white matter ischemic changes are again noted.  No acute intracranial abnormalities are identified, including mass lesion or mass effect, hydrocephalus, extra-axial fluid collection, midline shift,  hemorrhage, or acute infarction. The visualized bony calvarium is unremarkable.  A small amount of fluid in the sphenoid sinuses again noted.  CT CERVICAL SPINE FINDINGS  Normal alignment again noted.  There is no evidence of acute fracture, subluxation or prevertebral soft tissue swelling.  Moderate to severe degenerative disc disease from C3-C6 again noted.  No focal bony lesions are identified.  The soft tissue structures are unremarkable.  IMPRESSION: No evidence of acute intracranial or acute cervical spine abnormality.  Atrophy and chronic small-vessel white matter ischemic changes.  Moderate to severe degenerative disc disease from C3-C6.   Electronically Signed   By: Hassan Rowan M.D.   On: 01/12/2014 17:15   Dg Chest Port 1 View  01/12/2014   CLINICAL DATA:  Short of breath.  EXAM: PORTABLE CHEST - 1 VIEW  COMPARISON:  12/18/2013  FINDINGS: Left base are opacity is seen in the prior study has improved. There are prominent interstitial and vascular markings at the lung bases, left greater than right, accentuated by low lung volumes. Probable additional basilar atelectasis. No convincing acute infiltrate and no pulmonary edema. No pleural effusion or pneumothorax.  Cardiac silhouette is normal in size. Normal mediastinal and hilar contours.  IMPRESSION: No convincing acute cardiopulmonary disease. Lung base opacity seen on the prior study has improved.   Electronically Signed   By: Lajean Manes M.D.   On: 01/12/2014 15:27     EKG Interpretation   Date/Time:  Sunday Jan 12 2014 13:30:43 EDT Ventricular Rate:  72 PR Interval:  195 QRS Duration: 118 QT Interval:  447 QTC Calculation: 489 R Axis:   78 Text Interpretation:  Sinus rhythm Incomplete right bundle branch block  Nonspecific ST and T wave abnormality Confirmed by [facility]  MD, KATHLEEN  ([id number]) on 01/12/2014 7:46:34 PM       MDM   Final diagnoses:  Syncope  UTI (lower urinary tract infection)  Dementia  CRF (chronic renal failure)   Anemia    Filed Vitals:   01/12/14 1800 01/12/14 1845 01/12/14 1915 01/12/14 1930  BP: 151/82 156/68 162/78 171/68  Pulse: 67 78 65 67  Temp:    98.8 F (37.1 C)  TempSrc:      Resp: 15 22 15 15   SpO2: 100% 93% 95% 97%    Medications  0.9 %  sodium chloride infusion ( Intravenous Stopped 01/12/14 1947)  cefTRIAXone (ROCEPHIN) 1 g in dextrose 5 % 50 mL IVPB (0 g Intravenous Stopped 01/12/14 1851)    Alicia [name] is a 80 y.o. female presenting with syncopal event in church. Patient has had prior similar events, has loop recorder implanted for. Creatinine is mildly elevated at 1.27 which is typical for her baseline. However her hemoglobin is 9.2 which is 2 points lower than prior baseline. Guaiac ordered. EKG is nonischemic, troponin is negative, chest x-ray with no acute abnormality. Guaiac is negative as per verbal from ED lab, results are not crossing over in the computer system today.  UA consistent with UTI, patient will be started on Rocephin and urine culture is ordered.  Cardiology consult from Dr. Claiborne Billings appreciated: has reviewed loop reader analysis in Feb.  2015 with no significant abnormalities, does not feel syncope was necessarily cardiac in nature and they may interrogated aloof route her later date.  Hospitalist consult from Dr. Laren Everts appreciated: He has come to evaluate the patient and agrees that she is appropriate for discharge.  Evaluation does not show pathology that would require ongoing emergent intervention or inpatient treatment. Pt is hemodynamically stable and mentating appropriately. Discussed findings and plan with patient/guardian, who agrees with care plan. All questions answered. Return precautions discussed and outpatient follow up given.   Note: Portions of this report may have been transcribed using voice recognition software. Every effort was made to ensure accuracy; however, inadvertent computerized transcription errors may be present    Monico Blitz, PA-C 01/12/14 2003

## 2014-01-12 NOTE — ED Notes (Signed)
Discharge instructions reviewed with pt's family. Family verbalized understanding.

## 2014-01-12 NOTE — ED Notes (Signed)
Pt transported to CT ?

## 2014-01-12 NOTE — ED Notes (Signed)
[name], daughter-in-law, called to update on plan of care. States will come to pick patient up.

## 2014-01-12 NOTE — ED Notes (Signed)
Hospitalist at bedside 

## 2014-01-12 NOTE — Consult Note (Signed)
[facility]                                                                                    Patient Demographics  Alicia [name], is a 80 y.o. female  CSN: [id number]  MRN: [id number]  DOB - May 14, 1923  Admit Date - 01/12/2014  Outpatient Primary MD for the patient is Shellia Carwin, PA-C   With History of -  Past Medical History  Diagnosis Date  . Dementia     significant  . Renal disease   . Memory loss 12/10/2012  . Depression 12/10/2012  . Anxiety state, unspecified 12/10/2012  . Spells 12/10/2012  . Essential hypertension, benign 12/10/2012  . Other and unspecified hyperlipidemia 12/10/2012  . Loss of weight 12/10/2012  . Carotid artery stenosis   . Ovarian cancer     treated with surgery & chemo  . Ankle fracture, right   . Unstable gait     mild  . Syncope 12/10/2012    Recurrent, sporadic episodes  . Carotid artery disease       Past Surgical History  Procedure Laterality Date  . Vesicovaginal fistula closure w/ tah  2005  . Oophorectomy  2007    for ovarian cancer  . Prolapsed uterine fibroid ligation    . Appendectomy    . Cataract extraction Bilateral   . Implantable loop recorder  11/14    MDT LINQ implanted for recurrent unexplained syncope by Dr Rayann Heman  . Abdominal hysterectomy      in for   Chief Complaint  Patient presents with  . Near Syncope     HPI  Alicia [name]  is a 80 y.o. female, with past medical history significant for advanced dementia, hypertension and history of advanced ovarian cancer status post surgery in the past presenting for multiple episodes of syncope. She lives in [place] [facility] and has had multiple episodes of syncope in the past that necessitated a loop recorder that was interrogated one time in February 2015 by Dr. Joylene Grapes and was negative. The patient was found to have a urinary tract infection in the emergency room and she received Rocephin IV and I was asked to see patient in the ER.    Review  of Systems    Unable to obtain due to patient's condition   Social History History  Substance Use Topics  . Smoking status: Never Smoker   . Smokeless tobacco: Not on file  . Alcohol Use: No     Family History Family History  Problem Relation Age of Onset  . Cancer Mother     breast  . Stroke Father      Prior to Admission medications   Medication Sig Start Date End Date Taking? Authorizing Provider  amLODipine (NORVASC) 2.5 MG tablet Take 2.5 mg by mouth daily.   Yes Historical Provider, MD  aspirin EC 81 MG tablet Take 81 mg by mouth daily.   Yes Historical Provider, MD  atorvastatin (LIPITOR) 20 MG tablet Take 20 mg by mouth daily.   Yes Historical Provider, MD  calcium carbonate 200 MG capsule Take 600 mg by mouth at bedtime. Take at night   Yes  Historical Provider, MD  LORazepam (ATIVAN) 0.5 MG tablet Take 1 tablet (0.5 mg total) by mouth every 8 (eight) hours as needed for anxiety. 12/24/13  Yes Eugenie Filler, MD  Memantine HCl ER (NAMENDA XR) 7 MG CP24 Take 1 capsule (7 mg total) by mouth daily. To be used with free 30 day trial offer. 11/11/13  Yes Star Age, MD  Multiple Vitamin (MULTIVITAMIN WITH MINERALS) TABS Take 1 tablet by mouth daily.   Yes Historical Provider, MD  oxybutynin (DITROPAN-XL) 10 MG 24 hr tablet Take 10 mg by mouth daily.   Yes Historical Provider, MD  polyethylene glycol (MIRALAX / GLYCOLAX) packet Take 17 g by mouth daily. 12/24/13  Yes Eugenie Filler, MD  sertraline (ZOLOFT) 50 MG tablet Take 50 mg by mouth daily.   Yes Historical Provider, MD    Allergies  Allergen Reactions  . Iohexol Rash  . Ciprofloxacin Other (See Comments)    weakness  . Nsaids     Renal insufficiency  . Sulfa Antibiotics Hives and Itching  . Ivp Dye [Iodinated Diagnostic Agents] Rash    Physical Exam  Vitals  Blood pressure 158/80, pulse 73, temperature 97.6 F (36.4 C), temperature source Oral, resp. rate 18, SpO2 97.00%.   1. General a pleasantly  confused elderly white female in no acute distress  2. Normal affect and insight, Not Suicidal or Homicidal, confused.  3. No F.N deficits, ALL C.Nerves Intact, Strength 5/5 all 4 extremities, Sensation intact all 4 extremities, Plantars down going.  4. Ears and Eyes appear Normal, Conjunctivae clear, PERRLA. Moist Oral Mucosa.  5. Supple Neck, No JVD, No cervical lymphadenopathy appriciated, No Carotid Bruits.  6. Symmetrical Chest wall movement, Good air movement bilaterally, CTAB.  7. RRR, No Gallops, Rubs or Murmurs, No Parasternal Heave.  8. Positive Bowel Sounds, Abdomen Soft, Non tender, No organomegaly appriciated,No rebound -guarding or rigidity.  9.  No Cyanosis, Normal Skin Turgor, No Skin Rash or Bruise.  10. Good muscle tone,  joints appear normal , no effusions, Normal ROM.  11. No Palpable Lymph Nodes in Neck or Axillae    Data Review  CBC  Recent Labs Lab 01/12/14 1502  WBC 8.8  HGB 9.2*  HCT 40.2  PLT PLATELET CLUMPS NOTED ON SMEAR, COUNT APPEARS ADEQUATE  MCV 95.0  MCH 21.7*  MCHC 22.9*  RDW 15.0  LYMPHSABS 1.0  MONOABS 0.4  EOSABS 0.1  BASOSABS 0.0   ------------------------------------------------------------------------------------------------------------------  Chemistries   Recent Labs Lab 01/12/14 1502  NA 138  K 5.0  CL 100  CO2 21  GLUCOSE 99  BUN 33*  CREATININE 1.27*  CALCIUM 9.4   ------------------------------------------------------------------------------------------------------------------ CrCl is unknown because both a height and weight (above a minimum accepted value) are required for this calculation. ------------------------------------------------------------------------------------------------------------------ No results found for this basename: TSH, T4TOTAL, FREET3, T3FREE, THYROIDAB,  in the last 72 hours   Coagulation profile  Recent Labs Lab 01/12/14 1640  INR 0.95    ------------------------------------------------------------------------------------------------------------------- No results found for this basename: DDIMER,  in the last 72 hours -------------------------------------------------------------------------------------------------------------------  Cardiac Enzymes  Recent Labs Lab 01/12/14 1502  TROPONINI <0.30   ------------------------------------------------------------------------------------------------------------------ No components found with this basename: POCBNP,    ---------------------------------------------------------------------------------------------------------------  Urinalysis    Component Value Date/Time   COLORURINE YELLOW 01/12/2014 1713   APPEARANCEUR HAZY* 01/12/2014 1713   LABSPEC 1.022 01/12/2014 1713   PHURINE 5.5 01/12/2014 [place] 01/12/2014 1713   HGBUR LARGE* 01/12/2014 [place] 01/12/2014 1713  KETONESUR NEGATIVE 01/12/2014 1713   PROTEINUR NEGATIVE 01/12/2014 1713   UROBILINOGEN 0.2 01/12/2014 1713   NITRITE NEGATIVE 01/12/2014 1713   LEUKOCYTESUR MODERATE* 01/12/2014 1713    ----------------------------------------------------------------------------------------------------------------  Imaging results:   Dg Chest 2 View  12/19/2013   CLINICAL DATA:  Emesis, shortness of breath and diarrhea.  EXAM: CHEST  2 VIEW  COMPARISON:  07/18/2007 and 12/14/2009  FINDINGS: Lungs are hypoinflated demonstrate bibasilar airspace opacification left worse than right likely infection. Possible small amount left pleural fluid. Cardiomediastinal silhouette and remainder of the exam is unchanged.  IMPRESSION: Bibasilar airspace process left worse than right likely infection. Possible small amount of left pleural fluid.   Electronically Signed   By: Marin Olp M.D.   On: 12/19/2013 00:15   Dg Abd 1 View  12/19/2013   CLINICAL DATA:  Nausea and vomiting  EXAM: ABDOMEN - 1 VIEW  COMPARISON:   None.  FINDINGS: Moderate volume of formed stool, especially in the distal colon. No evidence of bowel obstruction. No abnormal intra-abdominal mass effect or calcification. Lower abdominal and right pelvic surgical clips. Apparent L1 body compression is likely projectional.  IMPRESSION: Nonobstructive bowel gas pattern.   Electronically Signed   By: Jorje Guild M.D.   On: 12/19/2013 04:15   Ct Head Wo Contrast  01/12/2014   CLINICAL DATA:  80 year old female with altered mental status and near syncope. Neck pain.  EXAM: CT HEAD WITHOUT CONTRAST  CT CERVICAL SPINE WITHOUT CONTRAST  TECHNIQUE: Multidetector CT imaging of the head and cervical spine was performed following the standard protocol without intravenous contrast. Multiplanar CT image reconstructions of the cervical spine were also generated.  COMPARISON:  12/25/2013 brain MR. 05/20/2012 head and cervical spine CT.  FINDINGS: CT HEAD FINDINGS  Atrophy and chronic small-vessel white matter ischemic changes are again noted.  No acute intracranial abnormalities are identified, including mass lesion or mass effect, hydrocephalus, extra-axial fluid collection, midline shift, hemorrhage, or acute infarction. The visualized bony calvarium is unremarkable.  A small amount of fluid in the sphenoid sinuses again noted.  CT CERVICAL SPINE FINDINGS  Normal alignment again noted.  There is no evidence of acute fracture, subluxation or prevertebral soft tissue swelling.  Moderate to severe degenerative disc disease from C3-C6 again noted.  No focal bony lesions are identified.  The soft tissue structures are unremarkable.  IMPRESSION: No evidence of acute intracranial or acute cervical spine abnormality.  Atrophy and chronic small-vessel white matter ischemic changes.  Moderate to severe degenerative disc disease from C3-C6.   Electronically Signed   By: Hassan Rowan M.D.   On: 01/12/2014 17:15   Ct Cervical Spine Wo Contrast  01/12/2014   CLINICAL DATA:  80 year old  female with altered mental status and near syncope. Neck pain.  EXAM: CT HEAD WITHOUT CONTRAST  CT CERVICAL SPINE WITHOUT CONTRAST  TECHNIQUE: Multidetector CT imaging of the head and cervical spine was performed following the standard protocol without intravenous contrast. Multiplanar CT image reconstructions of the cervical spine were also generated.  COMPARISON:  12/25/2013 brain MR. 05/20/2012 head and cervical spine CT.  FINDINGS: CT HEAD FINDINGS  Atrophy and chronic small-vessel white matter ischemic changes are again noted.  No acute intracranial abnormalities are identified, including mass lesion or mass effect, hydrocephalus, extra-axial fluid collection, midline shift, hemorrhage, or acute infarction. The visualized bony calvarium is unremarkable.  A small amount of fluid in the sphenoid sinuses again noted.  CT CERVICAL SPINE FINDINGS  Normal alignment again noted.  There  is no evidence of acute fracture, subluxation or prevertebral soft tissue swelling.  Moderate to severe degenerative disc disease from C3-C6 again noted.  No focal bony lesions are identified.  The soft tissue structures are unremarkable.  IMPRESSION: No evidence of acute intracranial or acute cervical spine abnormality.  Atrophy and chronic small-vessel white matter ischemic changes.  Moderate to severe degenerative disc disease from C3-C6.   Electronically Signed   By: Hassan Rowan M.D.   On: 01/12/2014 17:15   Mr Brain Wo Contrast  12/25/2013   CLINICAL DATA:  Headache and lethargy. History of dementia and hypertension. Chronic renal disease.  EXAM: MRI HEAD WITHOUT CONTRAST  TECHNIQUE: Multiplanar, multiecho pulse sequences of the brain and surrounding structures were obtained without intravenous contrast.  COMPARISON:  05/20/2012 CT head.  02/25/2010 MR.  FINDINGS: No evidence for acute infarction, hemorrhage, mass lesion, hydrocephalus, or extra-axial fluid. Advanced atrophy. Moderate chronic microvascular ischemic change affects  the periventricular and subcortical white matter. Small focus of chronic hemorrhage periventricular white matter on the right. Prominent perivascular spaces. Scattered chronic lacunes. Flow voids are preserved. No midline shift. Unremarkable pituitary and cerebellar tonsils. Cervical spondylosis. Bilateral cataract extraction. Minimal dependent mastoid fluid. No acute sinus disease.  IMPRESSION: Atrophy and small vessel disease.  No acute intracranial findings.   Electronically Signed   By: Rolla Flatten M.D.   On: 12/25/2013 14:17   Dg Chest Port 1 View  01/12/2014   CLINICAL DATA:  Short of breath.  EXAM: PORTABLE CHEST - 1 VIEW  COMPARISON:  12/18/2013  FINDINGS: Left base are opacity is seen in the prior study has improved. There are prominent interstitial and vascular markings at the lung bases, left greater than right, accentuated by low lung volumes. Probable additional basilar atelectasis. No convincing acute infiltrate and no pulmonary edema. No pleural effusion or pneumothorax.  Cardiac silhouette is normal in size. Normal mediastinal and hilar contours.  IMPRESSION: No convincing acute cardiopulmonary disease. Lung base opacity seen on the prior study has improved.   Electronically Signed   By: Lajean Manes M.D.   On: 01/12/2014 15:27    My personal review of EKG: Normal sinus at a rate of 72 with incomplete right bundle branch block and nonspecific ST changes  Assessment & Plan  1. syncopal episode; recurrent patient has a loop recorder. Last interrogated in February 2015 and was negative.    EKG shows no significant difference.    Patient is at her baseline 2. urinary tract infection with no fever or leukocytosis; status post IV Rocephin in the emergency room 3. anemia; can be worked as outpatient by PMD 4. Advanced dementia  Plan Patient seen and examined Labs and EKG reviewed Discussed with Monico Blitz the PA evaluating the patient . The patient can be followed by her primary  medical doctor as outpatient No need to admit especially the patient has a loop recorder and can be interrogated by her cardiologist Patient needs to followup with PMD for her anemia By mouth antibiotics to be prescribed by Ms Pisciotta Followup on urine culture results    Time spent in minutes : 34 minutes     @SIGNATURE @

## 2014-01-13 LAB — POC OCCULT BLOOD, ED: FECAL OCCULT BLD: NEGATIVE

## 2014-01-14 LAB — URINE CULTURE: Colony Count: >=100000

## 2014-01-15 ENCOUNTER — Telehealth (HOSPITAL_BASED_OUTPATIENT_CLINIC_OR_DEPARTMENT_OTHER): Payer: Self-pay | Admitting: Emergency Medicine

## 2014-01-15 NOTE — ED Provider Notes (Signed)
Medical screening examination/treatment/procedure(s) were performed by non-physician practitioner and as supervising physician I was immediately available for consultation/collaboration.   EKG Interpretation   Date/Time:  Sunday Jan 12 2014 13:30:43 EDT Ventricular Rate:  72 PR Interval:  195 QRS Duration: 118 QT Interval:  447 QTC Calculation: 489 R Axis:   78 Text Interpretation:  Sinus rhythm Incomplete right bundle branch block  Nonspecific ST and T wave abnormality Confirmed by [facility]  MD, Samaya Boardley  ([id number]) on 01/12/2014 [place] PM        Alfonzo Feller, DO 01/15/14 [phone]

## 2014-01-15 NOTE — Telephone Encounter (Signed)
Post ED Visit - Positive Culture Follow-up  Culture report reviewed by antimicrobial stewardship pharmacist: []  Wes [place], Pharm.D., BCPS []  Heide Guile, Pharm.D., BCPS []  Alycia Rossetti, Pharm.D., BCPS []  [place], [place].D., BCPS, AAHIVP []  Legrand Como, Pharm.D., BCPS, AAHIVP []  Juliene Pina, Pharm.D. [x]  Jeronimo Norma, Pharm.D.  Positive urine culture Treated with Keflex, organism sensitive to the same and no further patient follow-up is required at this time.  [place] 01/15/2014, 3:00 PM

## 2014-01-17 ENCOUNTER — Encounter: Payer: Self-pay | Admitting: *Deleted

## 2014-01-21 ENCOUNTER — Encounter (HOSPITAL_COMMUNITY): Payer: Self-pay | Admitting: Emergency Medicine

## 2014-01-21 ENCOUNTER — Inpatient Hospital Stay (HOSPITAL_COMMUNITY)
Admission: EM | Admit: 2014-01-21 | Discharge: 2014-01-26 | DRG: 312 | Disposition: A | Discharge status: Skilled Nursing Facility | Payer: Medicare Other | Attending: Internal Medicine | Admitting: Internal Medicine

## 2014-01-21 DIAGNOSIS — F3289 Other specified depressive episodes: Secondary | ICD-10-CM | POA: Diagnosis present

## 2014-01-21 DIAGNOSIS — R634 Abnormal weight loss: Secondary | ICD-10-CM

## 2014-01-21 DIAGNOSIS — J189 Pneumonia, unspecified organism: Secondary | ICD-10-CM

## 2014-01-21 DIAGNOSIS — Z8543 Personal history of malignant neoplasm of ovary: Secondary | ICD-10-CM

## 2014-01-21 DIAGNOSIS — R6889 Other general symptoms and signs: Secondary | ICD-10-CM

## 2014-01-21 DIAGNOSIS — R112 Nausea with vomiting, unspecified: Secondary | ICD-10-CM

## 2014-01-21 DIAGNOSIS — R55 Syncope and collapse: Principal | ICD-10-CM | POA: Diagnosis present

## 2014-01-21 DIAGNOSIS — F0280 Dementia in other diseases classified elsewhere without behavioral disturbance: Secondary | ICD-10-CM | POA: Diagnosis present

## 2014-01-21 DIAGNOSIS — F329 Major depressive disorder, single episode, unspecified: Secondary | ICD-10-CM

## 2014-01-21 DIAGNOSIS — Z66 Do not resuscitate: Secondary | ICD-10-CM | POA: Diagnosis present

## 2014-01-21 DIAGNOSIS — I10 Essential (primary) hypertension: Secondary | ICD-10-CM

## 2014-01-21 DIAGNOSIS — R413 Other amnesia: Secondary | ICD-10-CM | POA: Diagnosis present

## 2014-01-21 DIAGNOSIS — F411 Generalized anxiety disorder: Secondary | ICD-10-CM | POA: Diagnosis present

## 2014-01-21 DIAGNOSIS — I129 Hypertensive chronic kidney disease with stage 1 through stage 4 chronic kidney disease, or unspecified chronic kidney disease: Secondary | ICD-10-CM | POA: Diagnosis present

## 2014-01-21 DIAGNOSIS — N189 Chronic kidney disease, unspecified: Secondary | ICD-10-CM | POA: Diagnosis present

## 2014-01-21 DIAGNOSIS — F0390 Unspecified dementia without behavioral disturbance: Secondary | ICD-10-CM

## 2014-01-21 DIAGNOSIS — G20 Parkinson's disease: Secondary | ICD-10-CM | POA: Diagnosis present

## 2014-01-21 DIAGNOSIS — IMO0001 Reserved for inherently not codable concepts without codable children: Secondary | ICD-10-CM

## 2014-01-21 DIAGNOSIS — Z9181 History of falling: Secondary | ICD-10-CM

## 2014-01-21 DIAGNOSIS — G20A1 Parkinson's disease without dyskinesia, without mention of fluctuations: Secondary | ICD-10-CM | POA: Diagnosis present

## 2014-01-21 DIAGNOSIS — Z87440 Personal history of urinary (tract) infections: Secondary | ICD-10-CM

## 2014-01-21 DIAGNOSIS — Z7982 Long term (current) use of aspirin: Secondary | ICD-10-CM

## 2014-01-21 DIAGNOSIS — E785 Hyperlipidemia, unspecified: Secondary | ICD-10-CM | POA: Diagnosis present

## 2014-01-21 DIAGNOSIS — F32A Depression, unspecified: Secondary | ICD-10-CM

## 2014-01-21 DIAGNOSIS — N390 Urinary tract infection, site not specified: Secondary | ICD-10-CM | POA: Diagnosis present

## 2014-01-21 LAB — CBC WITH DIFFERENTIAL/PLATELET
BASOS PCT: 0 % (ref 0–1)
Basophils Absolute: 0 10*3/uL (ref 0.0–0.1)
Eosinophils Absolute: 0.1 10*3/uL (ref 0.0–0.7)
Eosinophils Relative: 1 % (ref 0–5)
HCT: 37.8 % (ref 36.0–46.0)
Hemoglobin: 12.7 g/dL (ref 12.0–15.0)
Lymphocytes Relative: 13 % (ref 12–46)
Lymphs Abs: 1 10*3/uL (ref 0.7–4.0)
MCH: 31.3 pg (ref 26.0–34.0)
MCHC: 33.6 g/dL (ref 30.0–36.0)
MCV: 93.1 fL (ref 78.0–100.0)
Monocytes Absolute: 0.5 10*3/uL (ref 0.1–1.0)
Monocytes Relative: 6 % (ref 3–12)
Neutro Abs: 6.4 10*3/uL (ref 1.7–7.7)
Neutrophils Relative %: 80 % — ABNORMAL HIGH (ref 43–77)
PLATELETS: 197 10*3/uL (ref 150–400)
RBC: 4.06 MIL/uL (ref 3.87–5.11)
RDW: 14.4 % (ref 11.5–15.5)
WBC: 8 10*3/uL (ref 4.0–10.5)

## 2014-01-21 LAB — BASIC METABOLIC PANEL
BUN: 36 mg/dL — AB (ref 6–23)
CO2: 24 meq/L (ref 19–32)
CREATININE: 1.35 mg/dL — AB (ref 0.50–1.10)
Calcium: 9.5 mg/dL (ref 8.4–10.5)
Chloride: 103 mEq/L (ref 96–112)
GFR calc Af Amer: 39 mL/min — ABNORMAL LOW (ref >90)
GFR calc non Af Amer: 34 mL/min — ABNORMAL LOW (ref >90)
Glucose, Bld: 104 mg/dL — ABNORMAL HIGH (ref 70–99)
Potassium: 4.5 mEq/L (ref 3.7–5.3)
Sodium: 141 mEq/L (ref 137–147)

## 2014-01-21 MED ORDER — CALCIUM CARBONATE 1250 (500 CA) MG PO TABS
1250.0000 mg | ORAL_TABLET | Freq: Every day | ORAL | Status: DC
  Administered 2014-01-21 – 2014-01-25 (×5): 1250 mg via ORAL
  Filled 2014-01-21 (×6): qty 1

## 2014-01-21 MED ORDER — ENOXAPARIN SODIUM 30 MG/0.3ML ~~LOC~~ SOLN
30.0000 mg | SUBCUTANEOUS | Status: DC
Start: 2014-01-21 — End: 2014-01-26
  Administered 2014-01-21 – 2014-01-25 (×5): 30 mg via SUBCUTANEOUS
  Filled 2014-01-21 (×6): qty 0.3

## 2014-01-21 MED ORDER — ADULT MULTIVITAMIN W/MINERALS CH
1.0000 | ORAL_TABLET | Freq: Every day | ORAL | Status: DC
  Administered 2014-01-22 – 2014-01-26 (×5): 1 via ORAL
  Filled 2014-01-21 (×6): qty 1

## 2014-01-21 MED ORDER — CEPHALEXIN 500 MG PO CAPS
500.0000 mg | ORAL_CAPSULE | Freq: Two times a day (BID) | ORAL | Status: DC
  Administered 2014-01-22 (×2): 500 mg via ORAL
  Filled 2014-01-21 (×5): qty 1

## 2014-01-21 MED ORDER — ATORVASTATIN CALCIUM 20 MG PO TABS
20.0000 mg | ORAL_TABLET | Freq: Every day | ORAL | Status: DC
  Administered 2014-01-21 – 2014-01-26 (×6): 20 mg via ORAL
  Filled 2014-01-21 (×6): qty 1

## 2014-01-21 MED ORDER — ASPIRIN EC 81 MG PO TBEC
81.0000 mg | DELAYED_RELEASE_TABLET | Freq: Every day | ORAL | Status: DC
  Administered 2014-01-22 – 2014-01-26 (×5): 81 mg via ORAL
  Filled 2014-01-21 (×6): qty 1

## 2014-01-21 MED ORDER — SODIUM CHLORIDE 0.9 % IV SOLN: 250.0000 mL | INTRAVENOUS | Status: DC | PRN

## 2014-01-21 MED ORDER — SODIUM CHLORIDE 0.9 % IJ SOLN: 3.0000 mL | Freq: Two times a day (BID) | INTRAMUSCULAR | Status: DC

## 2014-01-21 MED ORDER — POLYETHYLENE GLYCOL 3350 17 G PO PACK
17.0000 g | PACK | Freq: Every day | ORAL | Status: DC | PRN
  Filled 2014-01-21: qty 1

## 2014-01-21 MED ORDER — OXYBUTYNIN CHLORIDE ER 10 MG PO TB24
10.0000 mg | ORAL_TABLET | Freq: Every day | ORAL | Status: DC
  Filled 2014-01-21 (×2): qty 1

## 2014-01-21 MED ORDER — SERTRALINE HCL 50 MG PO TABS
50.0000 mg | ORAL_TABLET | Freq: Every day | ORAL | Status: DC
  Administered 2014-01-22 – 2014-01-26 (×5): 50 mg via ORAL
  Filled 2014-01-21 (×6): qty 1

## 2014-01-21 MED ORDER — SODIUM CHLORIDE 0.9 % IJ SOLN: 3.0000 mL | INTRAMUSCULAR | Status: DC | PRN

## 2014-01-21 MED ORDER — AMLODIPINE BESYLATE 2.5 MG PO TABS
2.5000 mg | ORAL_TABLET | Freq: Every day | ORAL | Status: DC
  Administered 2014-01-21 – 2014-01-22 (×2): 2.5 mg via ORAL
  Filled 2014-01-21 (×3): qty 1

## 2014-01-21 MED ORDER — SODIUM CHLORIDE 0.9 % IJ SOLN
3.0000 mL | Freq: Two times a day (BID) | INTRAMUSCULAR | Status: DC
Start: 2014-01-21 — End: 2014-01-26
  Administered 2014-01-21 – 2014-01-25 (×5): 3 mL via INTRAVENOUS

## 2014-01-21 NOTE — ED Notes (Signed)
Contact son Claira Jeter at [phone], or daughter in law at Kadra Kohan [phone] for any question or update.

## 2014-01-21 NOTE — Progress Notes (Signed)
Spoke with family, Pt had been recently discharged from SNF from being at the hospital with aspiration PNA. Pt was at home living with her son and daughter in law and had a syncopal episode while sitting on the toilet. Pts daughter in law mentioned that the patient does not bare down she "just goes" when she uses the bathroom. She has not controlled her bowels in awhile. Pt was not changing position when she had the syncopal episode. Pt had been on aricept in the past and had syncope as well as reported by family. When going over the plan of care family mentioned that the patient has regular checks and dopplers on her carotids and that Dr. Alroy Dust from [facility] last performed a doppler in April of 2014. Pts family also reported that her blood pressure had been "all over the place" even on the last admission to the hospital and that is why they had put her on 5 of norvasc at that time. Family reports 100% intake with both food and liquid "whatever is put in front of her"  Family mentioned that the patient had finished her course of antibiotics before she was admitted this admission.

## 2014-01-21 NOTE — H&P (Signed)
[facility] History and Physical  Alicia MARINEZ [name]:[id number] DOB: [birth year] DOA: 01/21/2014  Referring physician:  PCP: Shellia Carwin, PA-C  Specialists:   Chief Complaint: Syncope  HPI: [name] is a 80 y.o. female  With a history of Parkinson's, hypertension, dementia that presents to the emergency department for syncopal episode. Information for this history and physical were obtained from the son via telephone. Patient was recently in the hospital approximately one week ago for similar symptoms and was found to have a urinary tract infection. She was given antibiotics and sent home. Patient has a loop recorder in place. Patient was at home today when she started feeling differently, and almost passed out according to the son. Patient became clammy and pale. She emergency department. Her Glucola was interrogated and no arrhythmias were found. At this time patient has no fever, no leukocytosis.  Review of Systems:  Constitutional: Denies fever, chills, diaphoresis, appetite change and fatigue.  HEENT: Denies photophobia, eye pain, redness, hearing loss, ear pain, congestion, sore throat, rhinorrhea, sneezing, mouth sores, trouble swallowing, neck pain, neck stiffness and tinnitus.   Respiratory: Denies SOB, DOE, cough, chest tightness,  and wheezing.   Cardiovascular: Denies chest pain, palpitations and leg swelling.  Gastrointestinal: Denies nausea, vomiting, abdominal pain, diarrhea, constipation, blood in stool and abdominal distention.  Genitourinary: Denies dysuria, urgency, frequency, hematuria, flank pain and difficulty urinating.  Musculoskeletal: Denies myalgias, back pain, joint swelling, arthralgias and gait problem.  Skin: Denies pallor, rash and wound.  Neurological: Denies dizziness, seizures, syncope, weakness, light-headedness, numbness and headaches.  Hematological: Denies adenopathy. Easy bruising, personal or family bleeding history    Psychiatric/Behavioral: Denies suicidal ideation, mood changes, confusion, nervousness, sleep disturbance and agitation  Past Medical History  Diagnosis Date  . Dementia     significant  . Renal disease   . Memory loss 12/10/2012  . Depression 12/10/2012  . Anxiety state, unspecified 12/10/2012  . Spells 12/10/2012  . Essential hypertension, benign 12/10/2012  . Other and unspecified hyperlipidemia 12/10/2012  . Loss of weight 12/10/2012  . Carotid artery stenosis   . Ovarian cancer     treated with surgery & chemo  . Ankle fracture, right   . Unstable gait     mild  . Syncope 12/10/2012    Recurrent, sporadic episodes  . Carotid artery disease    Past Surgical History  Procedure Laterality Date  . Vesicovaginal fistula closure w/ tah  2005  . Oophorectomy  2007    for ovarian cancer  . Prolapsed uterine fibroid ligation    . Appendectomy    . Cataract extraction Bilateral   . Implantable loop recorder  11/14    MDT LINQ implanted for recurrent unexplained syncope by Dr Rayann Heman  . Abdominal hysterectomy     Social History:  reports that she has never smoked. She does not have any smokeless tobacco history on file. She reports that she does not drink alcohol or use illicit drugs. Lives at home with her family, son and daughter-in-law.  Allergies  Allergen Reactions  . Iohexol Rash  . Ciprofloxacin Other (See Comments)    weakness  . Nsaids     Renal insufficiency  . Sulfa Antibiotics Hives and Itching  . Ivp Dye [Iodinated Diagnostic Agents] Rash    Family History  Problem Relation Age of Onset  . Cancer Mother     breast  . Stroke Father     Prior to Admission medications   Medication Sig Start Date  End Date Taking? Authorizing Provider  amLODipine (NORVASC) 2.5 MG tablet Take 2.5 mg by mouth daily.   Yes Historical Provider, MD  aspirin EC 81 MG tablet Take 81 mg by mouth daily.   Yes Historical Provider, MD  atorvastatin (LIPITOR) 20 MG tablet Take 20 mg by  mouth daily.   Yes Historical Provider, MD  calcium carbonate (OS-CAL) 600 MG TABS tablet Take 600 mg by mouth at bedtime.   Yes Historical Provider, MD  Memantine HCl ER (NAMENDA XR) 7 MG CP24 Take 1 capsule (7 mg total) by mouth daily. To be used with free 30 day trial offer. 11/11/13  Yes Star Age, MD  Multiple Vitamin (MULTIVITAMIN WITH MINERALS) TABS Take 1 tablet by mouth daily.   Yes Historical Provider, MD  oxybutynin (DITROPAN-XL) 10 MG 24 hr tablet Take 10 mg by mouth daily.   Yes Historical Provider, MD  polyethylene glycol (MIRALAX / GLYCOLAX) packet Take 17 g by mouth daily as needed for mild constipation. With 8 oz of liquid daily. 12/24/13  Yes Eugenie Filler, MD  sertraline (ZOLOFT) 50 MG tablet Take 50 mg by mouth daily.   Yes Historical Provider, MD  cephALEXin (KEFLEX) 500 MG capsule Take 1 capsule (500 mg total) by mouth 2 (two) times daily. 01/12/14   Monico Blitz, PA-C   Physical Exam: Filed Vitals:   01/21/14 1530  BP: 166/65  Pulse: 65  Temp:   Resp: 18     General: Well developed, well nourished, NAD, appears stated age  HEENT: NCAT, PERRLA, EOMI, Anicteic Sclera, mucous membranes moist.   Neck: Supple, no JVD, no masses  Cardiovascular: S1 S2 auscultated, no rubs, murmurs or gallops. Regular rate and rhythm.  Respiratory: Clear to auscultation bilaterally with equal chest rise  Abdomen: Soft, nontender, nondistended, + bowel sounds  Extremities: warm dry without cyanosis clubbing or edema  Neuro: Awake and alert, not oriented.  CN 2-12 intact. No focal deficits  Skin: Without rashes exudates or nodules  Labs on Admission:  Basic Metabolic Panel:  Recent Labs Lab 01/21/14 1230  NA 141  K 4.5  CL 103  CO2 24  GLUCOSE 104*  BUN 36*  CREATININE 1.35*  CALCIUM 9.5   Liver Function Tests: No results found for this basename: AST, ALT, ALKPHOS, BILITOT, PROT, ALBUMIN,  in the last 168 hours No results found for this basename: LIPASE,  AMYLASE,  in the last 168 hours No results found for this basename: AMMONIA,  in the last 168 hours CBC:  Recent Labs Lab 01/21/14 1230  WBC 8.0  NEUTROABS 6.4  HGB 12.7  HCT 37.8  MCV 93.1  PLT 197   Cardiac Enzymes: No results found for this basename: CKTOTAL, CKMB, CKMBINDEX, TROPONINI,  in the last 168 hours  BNP (last 3 results) No results found for this basename: PROBNP,  in the last 8760 hours CBG: No results found for this basename: GLUCAP,  in the last 168 hours  Radiological Exams on Admission: No results found.  EKG: Independently reviewed. Sinus rhythm, rate 67, right bundle  Assessment/Plan  Syncope -Patient will be admitted to telemetry unit -Will conduct neuro checks every 4 hours -Loop recorder was interrogated the emergency department, no arrhythmias were found -Will hold Namenda, as this could possibly cause syncopal episodes -Will obtain EEG, if abnormal consider consulting neuro -Will obtain carotid ultrasound -Will obtain orthostatic blood pressures upon admission  Parkinson disease with dementia -Currently on no medication. -Will hold Namenda  Hypertension -Continue amlodipine  Recently diagnosed UTI -Continue cephalexin through 01/22/2014 -Urine culture showed Citrobacter.  Hyperlipidemia -Continue Lipitor  Depression -Continue Zoloft   DVT prophylaxis: Lovenox  Code Status: DNR  Condition: Guarded   Family Communication:  None at bedside.  Son via phone.  Admission, patients condition and plan of care including tests being ordered have been discussed with the patient's son, who indicates understanding and agrees with the plan and Code Status.  Disposition Plan: Observation  Time spent: 45 minutes  Sarit Sparano D.O. [facility] Pager [phone]  If 7PM-7AM, please contact night-coverage [email or web address] Password [facility] 01/21/2014, 3:59 PM

## 2014-01-21 NOTE — ED Notes (Signed)
Patient got out of bed and was standing in doorway, when I saw her. I assisted patient back into bed. When I entered room bed rails was still up on bed. Nurse Kandee Keen was informed about patient.

## 2014-01-21 NOTE — ED Notes (Signed)
Pt to ED via EMS from home for syncopal episode associated with nausea and vomiting. Per family, pt was using a bathroom and suddenly was clammy/diaphrotic, nausea and vomiting, then passed out. Per EMS, pt is orthostatic BP-100/50 sitting, BP-70/palpated standing. Pt woke up this morning not feeling well. Hx of frequent syncope per family. Per EMS, CBG-252, diet control diabetic, BP-124/51, O2-94% on room air.

## 2014-01-21 NOTE — ED Provider Notes (Signed)
CSN: [id number]     Arrival date & time 01/21/14  1059 History   First MD Initiated Contact with Patient 01/21/14 1142     Chief Complaint  Patient presents with  . Loss of Consciousness     (Consider location/radiation/quality/duration/timing/severity/associated sxs/prior Treatment) HPI Low 5 caveat dementia history is obtained from patient's son patient suffered a syncopal event this morning made with prior to coming here. She was unconscious for approximately 2 minutes. Immediately Upon regaining consciousness she vomited one time. She is presently asymptomatic and states "I feel good" . Patient has suffered from recurrent syncope. Last seen here 01/12/2014 for syncope determined to have a urinary tract infection treated with Rocephin and Keflex Past Medical History  Diagnosis Date  . Dementia     significant  . Renal disease   . Memory loss 12/10/2012  . Depression 12/10/2012  . Anxiety state, unspecified 12/10/2012  . Spells 12/10/2012  . Essential hypertension, benign 12/10/2012  . Other and unspecified hyperlipidemia 12/10/2012  . Loss of weight 12/10/2012  . Carotid artery stenosis   . Ovarian cancer     treated with surgery & chemo  . Ankle fracture, right   . Unstable gait     mild  . Syncope 12/10/2012    Recurrent, sporadic episodes  . Carotid artery disease    history of present illness continued  Which UTI is sensitive. No treatment prior to coming here. No other associated symptoms. Patient looks at baseline now to her family members who accompany her.  Past Surgical History  Procedure Laterality Date  . Vesicovaginal fistula closure w/ tah  2005  . Oophorectomy  2007    for ovarian cancer  . Prolapsed uterine fibroid ligation    . Appendectomy    . Cataract extraction Bilateral   . Implantable loop recorder  11/14    MDT LINQ implanted for recurrent unexplained syncope by Dr Rayann Heman  . Abdominal hysterectomy     Family History  Problem Relation Age of Onset  .  Cancer Mother     breast  . Stroke Father    History  Substance Use Topics  . Smoking status: Never Smoker   . Smokeless tobacco: Not on file  . Alcohol Use: No   OB History   Grav Para Term Preterm Abortions TAB SAB Ect Mult Living                 Review of Systems  Unable to perform ROS: Dementia      Allergies  Iohexol; Ciprofloxacin; Nsaids; Sulfa antibiotics; and Ivp dye  Home Medications   Prior to Admission medications   Medication Sig Start Date End Date Taking? Authorizing Provider  amLODipine (NORVASC) 2.5 MG tablet Take 2.5 mg by mouth daily.    Historical Provider, MD  aspirin EC 81 MG tablet Take 81 mg by mouth daily.    Historical Provider, MD  atorvastatin (LIPITOR) 20 MG tablet Take 20 mg by mouth daily.    Historical Provider, MD  calcium carbonate (OS-CAL) 600 MG TABS tablet Take 600 mg by mouth at bedtime.    Historical Provider, MD  cephALEXin (KEFLEX) 500 MG capsule Take 1 capsule (500 mg total) by mouth 2 (two) times daily. 01/12/14   Nicole Pisciotta, PA-C  LORazepam (ATIVAN) 0.5 MG tablet Take 1 tablet (0.5 mg total) by mouth every 8 (eight) hours as needed for anxiety. 12/24/13   Eugenie Filler, MD  Memantine HCl ER (NAMENDA XR) 7 MG CP24 Take 1  capsule (7 mg total) by mouth daily. To be used with free 30 day trial offer. 11/11/13   Star Age, MD  Multiple Vitamin (MULTIVITAMIN WITH MINERALS) TABS Take 1 tablet by mouth daily.    Historical Provider, MD  oxybutynin (DITROPAN-XL) 10 MG 24 hr tablet Take 10 mg by mouth daily.    Historical Provider, MD  polyethylene glycol (MIRALAX / GLYCOLAX) packet Take 17 g by mouth daily. With 8 oz of liquid daily. 12/24/13   Eugenie Filler, MD  sertraline (ZOLOFT) 50 MG tablet Take 50 mg by mouth daily.    Historical Provider, MD   BP 152/39  Pulse 72  Temp(Src) 98.5 F (36.9 C) (Oral)  Resp 14  SpO2 99% Physical Exam  Nursing note and vitals reviewed. Constitutional: She appears well-developed and  well-nourished.  HENT:  Head: Normocephalic and atraumatic.  Eyes: Conjunctivae are normal. Pupils are equal, round, and reactive to light.  Neck: Neck supple. No tracheal deviation present. No thyromegaly present.  Cardiovascular: Normal rate and regular rhythm.   No murmur heard. Pulmonary/Chest: Effort normal and breath sounds normal.  Abdominal: Soft. Bowel sounds are normal. She exhibits no distension. There is no tenderness.  Musculoskeletal: Normal range of motion. She exhibits no edema and no tenderness.  Neurological: She is alert. She has normal reflexes. No cranial nerve deficit. Coordination normal.  Follow simple commands not lightheaded on standing gait normal  Skin: Skin is warm and dry. No rash noted.  Psychiatric: She has a normal mood and affect.    ED Course  Procedures (including critical care time) Labs Review Labs Reviewed  BASIC METABOLIC PANEL  CBC WITH DIFFERENTIAL    Imaging Review No results found.   EKG Interpretation   Date/Time:  Tuesday Jan 21 2014 11:52:52 EDT Ventricular Rate:  67 PR Interval:  194 QRS Duration: 128 QT Interval:  461 QTC Calculation: 487 R Axis:   71 Text Interpretation:  Sinus rhythm Right bundle branch block No  significant change since last tracing Confirmed by Winfred Leeds  MD, Rayvin Abid  [phone]) on 01/21/2014 12:10:14 PM     Results for orders placed during the hospital encounter of 44/03/47  BASIC METABOLIC PANEL      Result Value Ref Range   Sodium 141  137 - 147 mEq/L   Potassium 4.5  3.7 - 5.3 mEq/L   Chloride 103  96 - 112 mEq/L   CO2 24  19 - 32 mEq/L   Glucose, Bld 104 (*) 70 - 99 mg/dL   BUN 36 (*) 6 - 23 mg/dL   Creatinine, Ser 1.35 (*) 0.50 - 1.10 mg/dL   Calcium 9.5  8.4 - 10.5 mg/dL   GFR calc non Af Amer 34 (*) >90 mL/min   GFR calc Af Amer 39 (*) >90 mL/min  CBC WITH DIFFERENTIAL      Result Value Ref Range   WBC 8.0  4.0 - 10.5 K/uL   RBC 4.06  3.87 - 5.11 MIL/uL   Hemoglobin 12.7  12.0 - 15.0 g/dL    HCT 37.8  36.0 - 46.0 %   MCV 93.1  78.0 - 100.0 fL   MCH 31.3  26.0 - 34.0 pg   MCHC 33.6  30.0 - 36.0 g/dL   RDW 14.4  11.5 - 15.5 %   Platelets 197  150 - 400 K/uL   Neutrophils Relative % 80 (*) 43 - 77 %   Neutro Abs 6.4  1.7 - 7.7 K/uL   Lymphocytes Relative  13  12 - 46 %   Lymphs Abs 1.0  0.7 - 4.0 K/uL   Monocytes Relative 6  3 - 12 %   Monocytes Absolute 0.5  0.1 - 1.0 K/uL   Eosinophils Relative 1  0 - 5 %   Eosinophils Absolute 0.1  0.0 - 0.7 K/uL   Basophils Relative 0  0 - 1 %   Basophils Absolute 0.0  0.0 - 0.1 K/uL   Ct Head Wo Contrast  01/12/2014   CLINICAL DATA:  80 year old female with altered mental status and near syncope. Neck pain.  EXAM: CT HEAD WITHOUT CONTRAST  CT CERVICAL SPINE WITHOUT CONTRAST  TECHNIQUE: Multidetector CT imaging of the head and cervical spine was performed following the standard protocol without intravenous contrast. Multiplanar CT image reconstructions of the cervical spine were also generated.  COMPARISON:  12/25/2013 brain MR. 05/20/2012 head and cervical spine CT.  FINDINGS: CT HEAD FINDINGS  Atrophy and chronic small-vessel white matter ischemic changes are again noted.  No acute intracranial abnormalities are identified, including mass lesion or mass effect, hydrocephalus, extra-axial fluid collection, midline shift, hemorrhage, or acute infarction. The visualized bony calvarium is unremarkable.  A small amount of fluid in the sphenoid sinuses again noted.  CT CERVICAL SPINE FINDINGS  Normal alignment again noted.  There is no evidence of acute fracture, subluxation or prevertebral soft tissue swelling.  Moderate to severe degenerative disc disease from C3-C6 again noted.  No focal bony lesions are identified.  The soft tissue structures are unremarkable.  IMPRESSION: No evidence of acute intracranial or acute cervical spine abnormality.  Atrophy and chronic small-vessel white matter ischemic changes.  Moderate to severe degenerative disc  disease from C3-C6.   Electronically Signed   By: Hassan Rowan M.D.   On: 01/12/2014 17:15   Ct Cervical Spine Wo Contrast  01/12/2014   CLINICAL DATA:  80 year old female with altered mental status and near syncope. Neck pain.  EXAM: CT HEAD WITHOUT CONTRAST  CT CERVICAL SPINE WITHOUT CONTRAST  TECHNIQUE: Multidetector CT imaging of the head and cervical spine was performed following the standard protocol without intravenous contrast. Multiplanar CT image reconstructions of the cervical spine were also generated.  COMPARISON:  12/25/2013 brain MR. 05/20/2012 head and cervical spine CT.  FINDINGS: CT HEAD FINDINGS  Atrophy and chronic small-vessel white matter ischemic changes are again noted.  No acute intracranial abnormalities are identified, including mass lesion or mass effect, hydrocephalus, extra-axial fluid collection, midline shift, hemorrhage, or acute infarction. The visualized bony calvarium is unremarkable.  A small amount of fluid in the sphenoid sinuses again noted.  CT CERVICAL SPINE FINDINGS  Normal alignment again noted.  There is no evidence of acute fracture, subluxation or prevertebral soft tissue swelling.  Moderate to severe degenerative disc disease from C3-C6 again noted.  No focal bony lesions are identified.  The soft tissue structures are unremarkable.  IMPRESSION: No evidence of acute intracranial or acute cervical spine abnormality.  Atrophy and chronic small-vessel white matter ischemic changes.  Moderate to severe degenerative disc disease from C3-C6.   Electronically Signed   By: Hassan Rowan M.D.   On: 01/12/2014 17:15   Mr Brain Wo Contrast  12/25/2013   CLINICAL DATA:  Headache and lethargy. History of dementia and hypertension. Chronic renal disease.  EXAM: MRI HEAD WITHOUT CONTRAST  TECHNIQUE: Multiplanar, multiecho pulse sequences of the brain and surrounding structures were obtained without intravenous contrast.  COMPARISON:  05/20/2012 CT head.  02/25/2010 MR.  FINDINGS:  No  evidence for acute infarction, hemorrhage, mass lesion, hydrocephalus, or extra-axial fluid. Advanced atrophy. Moderate chronic microvascular ischemic change affects the periventricular and subcortical white matter. Small focus of chronic hemorrhage periventricular white matter on the right. Prominent perivascular spaces. Scattered chronic lacunes. Flow voids are preserved. No midline shift. Unremarkable pituitary and cerebellar tonsils. Cervical spondylosis. Bilateral cataract extraction. Minimal dependent mastoid fluid. No acute sinus disease.  IMPRESSION: Atrophy and small vessel disease.  No acute intracranial findings.   Electronically Signed   By: Rolla Flatten M.D.   On: 12/25/2013 14:17   Dg Chest Port 1 View  01/12/2014   CLINICAL DATA:  Short of breath.  EXAM: PORTABLE CHEST - 1 VIEW  COMPARISON:  12/18/2013  FINDINGS: Left base are opacity is seen in the prior study has improved. There are prominent interstitial and vascular markings at the lung bases, left greater than right, accentuated by low lung volumes. Probable additional basilar atelectasis. No convincing acute infiltrate and no pulmonary edema. No pleural effusion or pneumothorax.  Cardiac silhouette is normal in size. Normal mediastinal and hilar contours.  IMPRESSION: No convincing acute cardiopulmonary disease. Lung base opacity seen on the prior study has improved.   Electronically Signed   By: Lajean Manes M.D.   On: 01/12/2014 15:27    MDM  I had a lengthy conversation with patient's son and daughter-in-law who are her caregivers they prefer inpatient stay to further investigate causes of syncope Spoke with Dr.Mikail plan 23 hour observation telemetry Diagnosis syncope #2 chronic renal insufficiency Final diagnoses:  None        Orlie Dakin, MD 01/21/14 1544

## 2014-01-22 ENCOUNTER — Encounter (HOSPITAL_COMMUNITY): Payer: Self-pay | Admitting: Radiology

## 2014-01-22 ENCOUNTER — Observation Stay (HOSPITAL_COMMUNITY): Payer: Medicare Other

## 2014-01-22 DIAGNOSIS — N390 Urinary tract infection, site not specified: Secondary | ICD-10-CM

## 2014-01-22 DIAGNOSIS — I359 Nonrheumatic aortic valve disorder, unspecified: Secondary | ICD-10-CM

## 2014-01-22 DIAGNOSIS — R55 Syncope and collapse: Secondary | ICD-10-CM

## 2014-01-22 LAB — BASIC METABOLIC PANEL
BUN: 36 mg/dL — ABNORMAL HIGH (ref 6–23)
CALCIUM: 9.2 mg/dL (ref 8.4–10.5)
CO2: 26 meq/L (ref 19–32)
Chloride: 102 mEq/L (ref 96–112)
Creatinine, Ser: 1.45 mg/dL — ABNORMAL HIGH (ref 0.50–1.10)
GFR calc non Af Amer: 31 mL/min — ABNORMAL LOW (ref >90)
GFR, EST AFRICAN AMERICAN: 36 mL/min — AB (ref >90)
Glucose, Bld: 109 mg/dL — ABNORMAL HIGH (ref 70–99)
Potassium: 4 mEq/L (ref 3.7–5.3)
SODIUM: 141 meq/L (ref 137–147)

## 2014-01-22 LAB — URINALYSIS, ROUTINE W REFLEX MICROSCOPIC
Bilirubin Urine: NEGATIVE
Glucose, UA: NEGATIVE mg/dL
Hgb urine dipstick: NEGATIVE
Ketones, ur: NEGATIVE mg/dL
NITRITE: NEGATIVE
PH: 5.5 (ref 5.0–8.0)
Protein, ur: NEGATIVE mg/dL
SPECIFIC GRAVITY, URINE: 1.012 (ref 1.005–1.030)
Urobilinogen, UA: 0.2 mg/dL (ref 0.0–1.0)

## 2014-01-22 LAB — CBC
HCT: 34.8 % — ABNORMAL LOW (ref 36.0–46.0)
Hemoglobin: 11.5 g/dL — ABNORMAL LOW (ref 12.0–15.0)
MCH: 30.9 pg (ref 26.0–34.0)
MCHC: 33 g/dL (ref 30.0–36.0)
MCV: 93.5 fL (ref 78.0–100.0)
PLATELETS: 193 10*3/uL (ref 150–400)
RBC: 3.72 MIL/uL — AB (ref 3.87–5.11)
RDW: 14.7 % (ref 11.5–15.5)
WBC: 5.9 10*3/uL (ref 4.0–10.5)

## 2014-01-22 LAB — URINE MICROSCOPIC-ADD ON

## 2014-01-22 MED ORDER — HYDRALAZINE HCL 20 MG/ML IJ SOLN
5.0000 mg | Freq: Four times a day (QID) | INTRAMUSCULAR | Status: DC | PRN
  Administered 2014-01-22 (×2): 5 mg via INTRAVENOUS
  Filled 2014-01-22 (×2): qty 1

## 2014-01-22 MED ORDER — HYDRALAZINE HCL 20 MG/ML IJ SOLN: 5.0000 mg | Freq: Four times a day (QID) | INTRAMUSCULAR | Status: DC | PRN

## 2014-01-22 MED ORDER — HYDRALAZINE HCL 20 MG/ML IJ SOLN
2.0000 mg | Freq: Once | INTRAMUSCULAR | Status: AC
  Administered 2014-01-22: 2 mg via INTRAVENOUS
  Filled 2014-01-22: qty 1

## 2014-01-22 NOTE — Progress Notes (Signed)
Echo Lab  2D Echocardiogram completed.  [place], [place] 01/22/2014 2:40 PM

## 2014-01-22 NOTE — Progress Notes (Signed)
*  PRELIMINARY RESULTS* Vascular Ultrasound Carotid Duplex (Doppler) has been completed.  Preliminary findings: Right = 40-59% internal carotid artery stenosis.  Left = 1-39% ICA stenosis.   Vertebral artery flow is antegrade.      Landry Mellow, RDMS, RVT  01/22/2014, 2:20 PM

## 2014-01-22 NOTE — Progress Notes (Signed)
OT Cancellation Note  Patient Details Name: JEARLENE BRIDWELL MRN: [id number] DOB: 1923-03-25   Cancelled Treatment:    Reason Eval/Treat Not Completed: Patient at procedure or test/ unavailable. Will continue to follow.  Haze Boyden Elanor Cale 01/22/2014, 2:16 PM

## 2014-01-22 NOTE — Progress Notes (Signed)
Routine adult EEG completed, results pending. 

## 2014-01-22 NOTE — Progress Notes (Signed)
Physical Therapy Evaluation Patient Details Name: Alicia [name] MRN: [id number] DOB: 1922-10-06 Today's Date: 01/22/2014   History of Present Illness  Patient is an 80 yo female admitted following syncopal episode. Patient with recent admission with UTI.  PMH: Parkinsons, dementia, HTN, frequent falls.  Clinical Impression  Patient presents with problems listed below.  Will benefit from acute PT to maximize independence prior to discharge.  Patient requires 24 hour hands-on assist for mobility and safety due to decreased cognition and h/o mult falls.  Recommend SNF at discharge for continued therapy.    Follow Up Recommendations SNF;Supervision/Assistance - 24 hour    Equipment Recommendations  Rolling walker with 5" wheels    Recommendations for Other Services       Precautions / Restrictions Precautions Precautions: Fall Precaution Comments: Frequent falls per chart Restrictions Weight Bearing Restrictions: No      Mobility  Bed Mobility Overal bed mobility: Needs Assistance Bed Mobility: Supine to Sit;Sit to Supine     Supine to sit: Supervision Sit to supine: Supervision   General bed mobility comments: Patient able to perform mobility.  Supervision for safety only.  Transfers Overall transfer level: Needs assistance Equipment used: Rolling walker (2 wheeled) Transfers: Sit to/from Stand Sit to Stand: Min guard         General transfer comment: Verbal cues for hand placement and safety.  Assist for safety/balance.  Ambulation/Gait Ambulation/Gait assistance: Min assist Ambulation Distance (Feet): 180 Feet Assistive device: Rolling walker (2 wheeled) Gait Pattern/deviations: Step-through pattern;Decreased stride length Gait velocity: Decreased Gait velocity interpretation: Below normal speed for age/gender General Gait Details: Patient required physical assist to maneuver RW.  Veers to left and runs into wall/obstacles without assist.  Patient with  fairly good balance with RW.  Stairs            Wheelchair Mobility    Modified Rankin (Stroke Patients Only)       Balance Overall balance assessment: Needs assistance         Standing balance support: Single extremity supported Standing balance-Leahy Scale: Fair                               Pertinent Vitals/Pain     Home Living Family/patient expects to be discharged to:: [place]: Children;Other relatives               Additional Comments: Patient unable to give information regarding living situation or equipment.    Prior Function Level of Independence: Needs assistance   Gait / Transfers Assistance Needed: unknown as pt. not cognitively intact and no family present; pt. reports independent and no use of AD for mobility     Comments: Unknown - patient unable to give accurate history     Hand Dominance        Extremity/Trunk Assessment   Upper Extremity Assessment: Overall WFL for tasks assessed           Lower Extremity Assessment: Generalized weakness         Communication   Communication: HOH  Cognition Arousal/Alertness: Awake/alert Behavior During Therapy: WFL for tasks assessed/performed Overall Cognitive Status: History of cognitive impairments - at baseline Area of Impairment: Orientation;Memory;Safety/judgement;Problem solving Orientation Level: Disoriented to;Place;Time;Situation   Memory: Decreased short-term memory   Safety/Judgement: Decreased awareness of deficits;Decreased awareness of safety   Problem Solving: Slow processing;Decreased initiation;Difficulty sequencing;Requires verbal cues General Comments: Patient states she doesn't know if she  lives in a house or not.  Unable to provide any accurate information.    General Comments      Exercises        Assessment/Plan    PT Assessment Patient needs continued PT services  PT Diagnosis Difficulty  walking;Abnormality of gait;Generalized weakness;Altered mental status   PT Problem List Decreased strength;Decreased balance;Decreased mobility;Decreased cognition;Decreased knowledge of use of DME;Decreased safety awareness  PT Treatment Interventions DME instruction;Gait training;Functional mobility training;Patient/family education;Cognitive remediation   PT Goals (Current goals can be found in the Care Plan section) Acute Rehab PT Goals Patient Stated Goal: Unable to state PT Goal Formulation: Patient unable to participate in goal setting Time For Goal Achievement: 02/05/14 Potential to Achieve Goals: Good    Frequency Min 3X/week   Barriers to discharge        Co-evaluation               End of Session Equipment Utilized During Treatment: Gait belt Activity Tolerance: Patient tolerated treatment well Patient left: in bed;with call bell/phone within reach;with bed alarm set Nurse Communication: Mobility status    Functional Assessment Tool Used: Clinical judgement Functional Limitation: Mobility: Walking and moving around Mobility: Walking and Moving Around Current Status (K5625): At least 20 percent but less than 40 percent impaired, limited or restricted Mobility: Walking and Moving Around Goal Status [phone]): At least 1 percent but less than 20 percent impaired, limited or restricted    Time: 1458-1525 PT Time Calculation (min): 27 min   Charges:   PT Evaluation $Initial PT Evaluation Tier I: 1 Procedure PT Treatments $Gait Training: 8-22 mins   PT G Codes:   Functional Assessment Tool Used: Clinical judgement Functional Limitation: Mobility: Walking and moving around    [employer] 01/22/2014, 3:42 PM Carita Pian. Sanjuana Kava, [place] Pager [phone]

## 2014-01-22 NOTE — Progress Notes (Signed)
[facility] PROGRESS NOTE  Alicia [name] MBT:[id number] DOB: 1922/11/13 DOA: 01/21/2014 PCP: Shellia Carwin, PA-C  Assessment/Plan: 80 y/o female with PMH of HTN, anxiety, dementia, parkinson's disease, frequent fall, syncope recent UTI,pnuemonia presented with syncopal episode   1. Syncope ? Vasovagal happened while having BM; neuro exam no focal expect parkinson's  -r/o infection; UTI; obtain UA, CXR, echo; hold ditropan   2. Frequent fall, likely parkinson's +dementia, recent UTI  -obtain PT eval' repeat UA, cont Po atx   3. Parkinson disease with dementia  -Currently on no medication. hold Namenda   4. Hypertension  -Continue amlodipine  5. Dementia; holding namenda   Code Status: DNR Family Communication: d/w patient; called [place], [place] [phone] no answer try later  (indicate person spoken with, relationship, and if by phone, the number) Disposition Plan: pend PT   Consultants:  None   Procedures:  Pend echo   Antibiotics:  keflex (indicate start date, and stop date if known)  HPI/Subjective: Alert, but confused   Objective: Filed Vitals:   01/22/14 0812  BP: 147/66  Pulse: 74  Temp: 98.1 F (36.7 C)  Resp: 18    Intake/Output Summary (Last 24 hours) at 01/22/14 0846 Last data filed at 01/21/14 1640  Gross per 24 hour  Intake      0 ml  Output    200 ml  Net   -200 ml   Filed Weights   01/21/14 1715 01/22/14 0539  Weight: 57.38 kg (126 lb 8 oz) 58.196 kg (128 lb 4.8 oz)    Exam:   General:  Alert, confused  Cardiovascular: s1,s2 rrr  Respiratory: CTA BL  Abdomen: soft,nt,nd   Musculoskeletal: no LE edema   Data Reviewed: Basic Metabolic Panel:  Recent Labs Lab 01/21/14 1230 01/22/14 0400  NA 141 141  K 4.5 4.0  CL 103 102  CO2 24 26  GLUCOSE 104* 109*  BUN 36* 36*  CREATININE 1.35* 1.45*  CALCIUM 9.5 9.2   Liver Function Tests: No results found for this basename: AST, ALT, ALKPHOS, BILITOT, PROT,  ALBUMIN,  in the last 168 hours No results found for this basename: LIPASE, AMYLASE,  in the last 168 hours No results found for this basename: AMMONIA,  in the last 168 hours CBC:  Recent Labs Lab 01/21/14 1230 01/22/14 0400  WBC 8.0 5.9  NEUTROABS 6.4  --   HGB 12.7 11.5*  HCT 37.8 34.8*  MCV 93.1 93.5  PLT 197 193   Cardiac Enzymes: No results found for this basename: CKTOTAL, CKMB, CKMBINDEX, TROPONINI,  in the last 168 hours BNP (last 3 results) No results found for this basename: PROBNP,  in the last 8760 hours CBG: No results found for this basename: GLUCAP,  in the last 168 hours  Recent Results (from the past 240 hour(s))  URINE CULTURE     Status: None   Collection Time    01/12/14  5:13 PM      Result Value Ref Range Status   Specimen Description URINE, CATHETERIZED   Final   Special Requests ADD [id number]   Final   Culture  Setup Time     Final   Value: 01/13/2014 01:02     Performed at [facility]     Final   Value: >=100,000 COLONIES/ML     Performed at [facility]   Culture     Final   Value: CITROBACTER KOSERI     Performed at  [facility]   Report Status 01/14/2014 FINAL   Final   Organism ID, Bacteria CITROBACTER KOSERI   Final     Studies: No results found.  Scheduled Meds: . amLODipine  2.5 mg Oral Daily  . aspirin EC  81 mg Oral Daily  . atorvastatin  20 mg Oral Daily  . calcium carbonate  1,250 mg Oral QHS  . cephALEXin  500 mg Oral BID  . enoxaparin (LOVENOX) injection  30 mg Subcutaneous Q24H  . multivitamin with minerals  1 tablet Oral Daily  . oxybutynin  10 mg Oral Daily  . sertraline  50 mg Oral Daily  . sodium chloride  3 mL Intravenous Q12H  . sodium chloride  3 mL Intravenous Q12H   Continuous Infusions:   Active Problems:   Syncope    Time spent: >35 minutes    Kinnie Feil  [facility] Pager [phone]. If 7PM-7AM, please contact night-coverage at [email or web address],  password [facility] 01/22/2014, 8:46 AM  LOS: 1 day

## 2014-01-22 NOTE — Procedures (Signed)
History: 80 year old female with syncope  Sedation: None  Technique: This is a 17 channel routine scalp EEG performed at the bedside with bipolar and monopolar montages arranged in accordance to the international 10/20 system of electrode placement. One channel was dedicated to EKG recording.    Background: There is a well defined posterior dominant rhythm of 8.5 Hz that attenuates with eye opening. The background consists of intermixed alpha and beta activities during periods of maximal wakefulness. The predominance of this EEG, however, consists of sleep with bilaterally symmetric sleep structures.  Photic stimulation: Physiologic driving is not performed  EEG Abnormalities: None  Clinical Interpretation: This normal EEG is recorded in the waking and sleep state. There was no seizure or seizure predisposition recorded on this study.   Roland Rack, MD [facility] [phone]  If 7pm- 7am, please page neurology on call as listed in [facility].

## 2014-01-23 MED ORDER — AMLODIPINE BESYLATE 5 MG PO TABS
5.0000 mg | ORAL_TABLET | Freq: Every day | ORAL | Status: AC
  Administered 2014-01-23: 5 mg via ORAL
  Filled 2014-01-23: qty 1

## 2014-01-23 MED ORDER — AMLODIPINE BESYLATE 5 MG PO TABS
5.0000 mg | ORAL_TABLET | Freq: Every day | ORAL | Status: DC
  Administered 2014-01-24 – 2014-01-26 (×3): 5 mg via ORAL
  Filled 2014-01-23 (×3): qty 1

## 2014-01-23 MED ORDER — PIPERACILLIN-TAZOBACTAM 3.375 G IVPB
3.3750 g | Freq: Three times a day (TID) | INTRAVENOUS | Status: DC
  Administered 2014-01-23 – 2014-01-26 (×7): 3.375 g via INTRAVENOUS
  Filled 2014-01-23 (×11): qty 50

## 2014-01-23 NOTE — Progress Notes (Addendum)
[facility] PROGRESS NOTE  Alicia [name] PXT:[id number] DOB: Sep 09, 1923 DOA: 01/21/2014 PCP: Shellia Carwin, PA-C  Assessment/Plan: 80 y/o female with PMH of HTN, anxiety, dementia, parkinson's disease, frequent fall, syncope recent UTI,pnuemonia presented with syncopal episode   1. Syncope ? Vasovagal happened while having BM; neuro exam no focal expect parkinson's  -may still have "+"UTI; CXR: Small amount of patchy infiltrate remains in the left base; EEG no seizure  -echo: ejection fraction was 65%. Wall motion was normal, diastolic dysfunction;  -started IV atx for infection;s PT eval: SNF; hold ditropan   2. Frequent fall, likely parkinson's +dementia, UTI  -cont atx; PT eval: SNF  3. Parkinson disease with dementia  -Currently on no medication. hold Namenda   4. Hypertension  -Continue amlodipine, increased to 5 mg   5. Dementia; holding namenda   6. UTI, change to IV atx today; repeat urine cultures   Code Status: DNR Family Communication: d/w patient; d/w her daughter   (indicate person spoken with, relationship, and if by phone, the number) Disposition Plan: SNF 24-48 hours   Consultants:  None   Procedures:  Pend echo   Antibiotics:  keflex (indicate start date, and stop date if known)  HPI/Subjective: Alert, but confused   Objective: Filed Vitals:   01/23/14 0801  BP: 127/80  Pulse: 73  Temp: 97.7 F (36.5 C)  Resp: 18    Intake/Output Summary (Last 24 hours) at 01/23/14 0953 Last data filed at 01/22/14 2046  Gross per 24 hour  Intake      0 ml  Output   1775 ml  Net  -1775 ml   Filed Weights   01/21/14 1715 01/22/14 0539 01/23/14 0400  Weight: 57.38 kg (126 lb 8 oz) 58.196 kg (128 lb 4.8 oz) 57.924 kg (127 lb 11.2 oz)    Exam:   General:  Alert, confused  Cardiovascular: s1,s2 rrr  Respiratory: CTA BL  Abdomen: soft,nt,nd   Musculoskeletal: no LE edema   Data Reviewed: Basic Metabolic Panel:  Recent Labs Lab  01/21/14 1230 01/22/14 0400  NA 141 141  K 4.5 4.0  CL 103 102  CO2 24 26  GLUCOSE 104* 109*  BUN 36* 36*  CREATININE 1.35* 1.45*  CALCIUM 9.5 9.2   Liver Function Tests: No results found for this basename: AST, ALT, ALKPHOS, BILITOT, PROT, ALBUMIN,  in the last 168 hours No results found for this basename: LIPASE, AMYLASE,  in the last 168 hours No results found for this basename: AMMONIA,  in the last 168 hours CBC:  Recent Labs Lab 01/21/14 1230 01/22/14 0400  WBC 8.0 5.9  NEUTROABS 6.4  --   HGB 12.7 11.5*  HCT 37.8 34.8*  MCV 93.1 93.5  PLT 197 193   Cardiac Enzymes: No results found for this basename: CKTOTAL, CKMB, CKMBINDEX, TROPONINI,  in the last 168 hours BNP (last 3 results) No results found for this basename: PROBNP,  in the last 8760 hours CBG: No results found for this basename: GLUCAP,  in the last 168 hours  No results found for this or any previous visit (from the past 240 hour(s)).   Studies: Dg Chest 2 View  01/22/2014   CLINICAL DATA:  Cough  EXAM: CHEST  2 VIEW  COMPARISON:  Jan 12, 2014 and December 18, 2013  FINDINGS: The right lung is now clear. There remains mild patchy infiltrate in the left base, essentially stable compared to most recent prior study. No new opacity. Heart size and  pulmonary vascularity are normal. No adenopathy. No bone lesions.  IMPRESSION: Small amount of patchy infiltrate remains in the left base. Lungs elsewhere are clear. No new opacity.   Electronically Signed   By: Lowella Grip M.D.   On: 01/22/2014 10:10    Scheduled Meds: . amLODipine  2.5 mg Oral Daily  . aspirin EC  81 mg Oral Daily  . atorvastatin  20 mg Oral Daily  . calcium carbonate  1,250 mg Oral QHS  . cephALEXin  500 mg Oral BID  . enoxaparin (LOVENOX) injection  30 mg Subcutaneous Q24H  . multivitamin with minerals  1 tablet Oral Daily  . sertraline  50 mg Oral Daily  . sodium chloride  3 mL Intravenous Q12H  . sodium chloride  3 mL Intravenous Q12H    Continuous Infusions:   Active Problems:   Syncope    Time spent: >35 minutes    Kinnie Feil  [facility] Pager [phone]. If 7PM-7AM, please contact night-coverage at [email or web address], password [facility] 01/23/2014, 9:53 AM  LOS: 2 days

## 2014-01-23 NOTE — Progress Notes (Signed)
ANTIBIOTIC CONSULT NOTE - INITIAL  Pharmacy Consult for Zosyn Indication: UTI  Allergies  Allergen Reactions  . Iohexol Rash  . Ciprofloxacin Other (See Comments)    weakness  . Nsaids     Renal insufficiency  . Sulfa Antibiotics Hives and Itching  . Ivp Dye [Iodinated Diagnostic Agents] Rash    Patient Measurements: Height: 5\' 5"  (165.1 cm) Weight: 127 lb 11.2 oz (57.924 kg) IBW/kg (Calculated) : 57  Vital Signs: Temp: 97.7 F (36.5 C) (05/14 0801) Temp src: Oral (05/14 0801) BP: 127/80 mmHg (05/14 0801) Pulse Rate: 73 (05/14 0801) Intake/Output from previous day: 05/13 0701 - 05/14 0700 In: -  Out: 1775 [Urine:1775] Intake/Output from this shift:    Labs:  Recent Labs  01/21/14 1230 01/22/14 0400  WBC 8.0 5.9  HGB 12.7 11.5*  PLT 197 193  CREATININE 1.35* 1.45*   Estimated Creatinine Clearance: 24.1 ml/min (by C-G formula based on Cr of 1.45).  Microbiology: Recent Results (from the past 720 hour(s))  URINE CULTURE     Status: None   Collection Time    01/12/14  5:13 PM      Result Value Ref Range Status   Specimen Description URINE, CATHETERIZED   Final   Special Requests ADD [id number]   Final   Culture  Setup Time     Final   Value: 01/13/2014 01:02     Performed at [place]     Final   Value: >=100,000 COLONIES/ML     Performed at [place]     Final   Value: [place]     Performed at [facility]   Report Status 01/14/2014 FINAL   Final   Organism ID, Bacteria CITROBACTER KOSERI   Final    Medical History: Past Medical History  Diagnosis Date  . Dementia     significant  . Renal disease   . Memory loss 12/10/2012  . Depression 12/10/2012  . Anxiety state, unspecified 12/10/2012  . Spells 12/10/2012  . Essential hypertension, benign 12/10/2012  . Other and unspecified hyperlipidemia 12/10/2012  . Loss of weight 12/10/2012  . Carotid artery stenosis   . Ovarian cancer     treated with surgery & chemo  . Ankle fracture, right   . Unstable gait     mild  . Syncope 12/10/2012    Recurrent, sporadic episodes  . Carotid artery disease     Medications:  Scheduled:  . [START ON 01/24/2014] amLODipine  5 mg Oral Daily  . amLODipine  5 mg Oral Daily  . aspirin EC  81 mg Oral Daily  . atorvastatin  20 mg Oral Daily  . calcium carbonate  1,250 mg Oral QHS  . cephALEXin  500 mg Oral BID  . enoxaparin (LOVENOX) injection  30 mg Subcutaneous Q24H  . multivitamin with minerals  1 tablet Oral Daily  . sertraline  50 mg Oral Daily  . sodium chloride  3 mL Intravenous Q12H  . sodium chloride  3 mL Intravenous Q12H   Assessment: 80 yo F with recent oupt treatment of UTI presented to ED 5/12 following a syncopal event.  Concern that pt has continued infection, either UTI or PNA, that needs IV antibiotic treatment.  SCr 1.45 with CrCl >20  Goal of Therapy:  Renal dose adjustment of antibiotics  Plan:  Zosyn 3.375 gm IV q8h (4 hour infusion). Further dose adjustment not needed unless SCr <20 ml/min. Pharmacy  will sign off.  [employer], Pharm.D., BCPS Clinical Pharmacist Pager [phone] 01/23/2014 10:27 AM

## 2014-01-23 NOTE — Progress Notes (Signed)
UR Completed Caelin Rayl Graves-Bigelow, RN,BSN [phone]  

## 2014-01-23 NOTE — Progress Notes (Signed)
OT Cancellation Note  Patient Details Name: Alicia [name] MRN: [id number] DOB: [birth year]   Cancelled Treatment:    Reason Eval/Treat Not Completed: Other (comment) Pt is Medicare/Medicaid and current D/C plan is SNF. No apparent immediate acute care OT needs, therefore will defer OT to SNF. If OT eval is needed please call Acute Rehab Dept. at [phone] or text page OT at [phone].   [place], [place]  [phone] 01/23/2014 01/23/2014, 12:37 PM

## 2014-01-24 NOTE — Progress Notes (Signed)
Physical Therapy Treatment Patient Details Name: Alicia [name] MRN: [id number] DOB: 1923-06-08 Today's Date: 01/24/2014    History of Present Illness Patient is an 80 yo female admitted following syncopal episode. Patient with recent admission with UTI.  PMH: Parkinsons, dementia, HTN, frequent falls.    PT Comments    Pt agreeable to participate in therapy.   Incr tx time due to pt with incontinence of bladder upon arrival.  Required total (A) for pericare & gown change.  Trialed ambulation without RW due to son stating pt was independent PTA.  Pushed IV pole ~200' with min guard assist.  No LOB noted.  Son & daughter-in-law report pt's cognition has been declining since April but even more so since admitted in hospital.     Follow Up Recommendations  SNF;Supervision/Assistance - 24 hour     Equipment Recommendations  Rolling walker with 5" wheels    Recommendations for Other Services       Precautions / Restrictions Precautions Precautions: Fall Precaution Comments: Frequent falls per chart Restrictions Weight Bearing Restrictions: No    Mobility  Bed Mobility   Bed Mobility: Supine to Sit     Supine to sit: Modified independent (Device/Increase time)        Transfers Overall transfer level: Needs assistance Equipment used: Rolling walker (2 wheeled) Transfers: Sit to/from Stand Sit to Stand: Min guard         General transfer comment: cues for hand placement  Ambulation/Gait Ambulation/Gait assistance: Min guard Ambulation Distance (Feet): 200 Feet Assistive device:  (pushing IV pole) Gait Pattern/deviations: Step-through pattern;Decreased stride length     General Gait Details: Trialed ambulation without use of RW due to family stating pt was independent PTA.  Pt pushed IV pole with min guard assist for safety only.  no LOB noted.  Pt scanning environment as she was watching Staff work.     Stairs            Wheelchair Mobility     Modified Rankin (Stroke Patients Only)       Balance Overall balance assessment: Needs assistance         Standing balance support: Single extremity supported Standing balance-Leahy Scale: Fair Standing balance comment: stood x 3-4 mins while this therapist performed pericare                    Cognition Arousal/Alertness: Awake/alert Behavior During Therapy: Flat affect Overall Cognitive Status: History of cognitive impairments - at baseline                 General Comments: Pt only answers yes/no questions.  son & daughter-in-law present at end of session & report pt's cognition declining.      Exercises      General Comments        Pertinent Vitals/Pain No pain reported    Home Living                      Prior Function            PT Goals (current goals can now be found in the care plan section) Acute Rehab PT Goals Patient Stated Goal: Unable to state PT Goal Formulation: Patient unable to participate in goal setting Time For Goal Achievement: 02/05/14 Potential to Achieve Goals: Good Progress towards PT goals: Progressing toward goals    Frequency  Min 3X/week    PT Plan Current plan remains appropriate    Co-evaluation  End of Session Equipment Utilized During Treatment: Gait belt Activity Tolerance: Patient tolerated treatment well Patient left: in chair;with call bell/phone within reach;with family/visitor present     Time: 0850-0922 PT Time Calculation (min): 32 min  Charges:  $Gait Training: 8-22 mins $Self Care/Home Management: [place], [place] [phone] 01/24/2014

## 2014-01-24 NOTE — Progress Notes (Signed)
[facility] PROGRESS NOTE  Alicia [name] FIE:[id number] DOB: 02/14/1923 DOA: 01/21/2014 PCP: Shellia Carwin, PA-C  Assessment/Plan: 80 y/o female with PMH of HTN, anxiety, dementia, parkinson's disease, frequent fall, syncope recent UTI,pnuemonia presented with syncopal episode   1. Syncope ? Vasovagal happened while having BM; neuro exam no focal expect parkinson's  -may still have "+"UTI; CXR: Small amount of patchy infiltrate remains in the left base; EEG no seizure  -echo: ejection fraction was 65%. Wall motion was normal, diastolic dysfunction;  -started IV atx for infections; PT eval: SNF; hold ditropan   2. Frequent fall, likely parkinson's +dementia, UTI  -cont atx; PT eval: SNF  3. Parkinson disease with dementia  -Currently on no medication. hold Namenda   4. Hypertension  -Continue amlodipine, increased to 5 mg   5. Dementia; holding namenda   6. UTI, change to IV atx today; previous cultures: citrobacter; repeat urine cultures pend   SNF in 24-48 hours   Code Status: DNR Family Communication: d/w patient; d/w her daughter in law; her son    (indicate person spoken with, relationship, and if by phone, the number) Disposition Plan: SNF 24-48 hours   Consultants:  None   Procedures:  Pend echo   Antibiotics:  keflex (indicate start date, and stop date if known)  HPI/Subjective: Alert, but confused   Objective: Filed Vitals:   01/24/14 0743  BP: 151/63  Pulse: 71  Temp: 97.3 F (36.3 C)  Resp: 16    Intake/Output Summary (Last 24 hours) at 01/24/14 1001 Last data filed at 01/23/14 2112  Gross per 24 hour  Intake    600 ml  Output    450 ml  Net    150 ml   Filed Weights   01/22/14 0539 01/23/14 0400 01/24/14 0400  Weight: 58.196 kg (128 lb 4.8 oz) 57.924 kg (127 lb 11.2 oz) 58.605 kg (129 lb 3.2 oz)    Exam:   General:  Alert, confused  Cardiovascular: s1,s2 rrr  Respiratory: CTA BL  Abdomen: soft,nt,nd    Musculoskeletal: no LE edema   Data Reviewed: Basic Metabolic Panel:  Recent Labs Lab 01/21/14 1230 01/22/14 0400  NA 141 141  K 4.5 4.0  CL 103 102  CO2 24 26  GLUCOSE 104* 109*  BUN 36* 36*  CREATININE 1.35* 1.45*  CALCIUM 9.5 9.2   Liver Function Tests: No results found for this basename: AST, ALT, ALKPHOS, BILITOT, PROT, ALBUMIN,  in the last 168 hours No results found for this basename: LIPASE, AMYLASE,  in the last 168 hours No results found for this basename: AMMONIA,  in the last 168 hours CBC:  Recent Labs Lab 01/21/14 1230 01/22/14 0400  WBC 8.0 5.9  NEUTROABS 6.4  --   HGB 12.7 11.5*  HCT 37.8 34.8*  MCV 93.1 93.5  PLT 197 193   Cardiac Enzymes: No results found for this basename: CKTOTAL, CKMB, CKMBINDEX, TROPONINI,  in the last 168 hours BNP (last 3 results) No results found for this basename: PROBNP,  in the last 8760 hours CBG: No results found for this basename: GLUCAP,  in the last 168 hours  No results found for this or any previous visit (from the past 240 hour(s)).   Studies: No results found.  Scheduled Meds: . amLODipine  5 mg Oral Daily  . aspirin EC  81 mg Oral Daily  . atorvastatin  20 mg Oral Daily  . calcium carbonate  1,250 mg Oral QHS  . enoxaparin (LOVENOX)  injection  30 mg Subcutaneous Q24H  . multivitamin with minerals  1 tablet Oral Daily  . piperacillin-tazobactam (ZOSYN)  IV  3.375 g Intravenous 3 times per day  . sertraline  50 mg Oral Daily  . sodium chloride  3 mL Intravenous Q12H  . sodium chloride  3 mL Intravenous Q12H   Continuous Infusions:   Active Problems:   Syncope    Time spent: >35 minutes    Kinnie Feil  [facility] Pager [phone]. If 7PM-7AM, please contact night-coverage at [email or web address], password [facility] 01/24/2014, 10:01 AM  LOS: 3 days

## 2014-01-25 NOTE — Progress Notes (Signed)
[facility] PROGRESS NOTE  Alicia [name] ZTI:[id number] DOB: October 12, 1922 DOA: 01/21/2014 PCP: Shellia Carwin, PA-C  Assessment/Plan: 80 y/o female with PMH of HTN, anxiety, dementia, parkinson's disease, frequent fall, syncope recent UTI,pnuemonia presented with syncopal episode   1. Syncope ? Vasovagal happened while having BM; neuro exam no focal expect parkinson's  -may still have "+"UTI; CXR: Small amount of patchy infiltrate remains in the left base; EEG no seizure  -echo: ejection fraction was 65%. Wall motion was normal, diastolic dysfunction;  -cont IV atx for infections; PT eval: SNF; hold ditropan   2. Frequent fall, likely parkinson's +dementia, UTI  -cont atx; PT eval: SNF  3. Parkinson disease with dementia  -Currently on no medication. hold Namenda   4. Hypertension  -Continue amlodipine, increased to 5 mg   5. Dementia; holding namenda   6. UTI, change to IV atx today; previous cultures: citrobacter; repeat urine cultures pend   SNF in 24-48 hours, SW involve d  Code Status: DNR Family Communication: d/w patient; d/w her daughter in [employer]; her son    (indicate person spoken with, relationship, and if by phone, the number) Disposition Plan: SNF 24-48 hours   Consultants:  None   Procedures:  Pend echo   Antibiotics:  keflex (indicate start date, and stop date if known)  HPI/Subjective: Alert, but confused   Objective: Filed Vitals:   01/25/14 0748  BP: 150/60  Pulse: 78  Temp: 98.4 F (36.9 C)  Resp: 18    Intake/Output Summary (Last 24 hours) at 01/25/14 1024 Last data filed at 01/25/14 0830  Gross per 24 hour  Intake    240 ml  Output    400 ml  Net   -160 ml   Filed Weights   01/23/14 0400 01/24/14 0400 01/25/14 0400  Weight: 57.924 kg (127 lb 11.2 oz) 58.605 kg (129 lb 3.2 oz) 59.512 kg (131 lb 3.2 oz)    Exam:   General:  Alert, confused  Cardiovascular: s1,s2 rrr  Respiratory: CTA BL  Abdomen: soft,nt,nd    Musculoskeletal: no LE edema   Data Reviewed: Basic Metabolic Panel:  Recent Labs Lab 01/21/14 1230 01/22/14 0400  NA 141 141  K 4.5 4.0  CL 103 102  CO2 24 26  GLUCOSE 104* 109*  BUN 36* 36*  CREATININE 1.35* 1.45*  CALCIUM 9.5 9.2   Liver Function Tests: No results found for this basename: AST, ALT, ALKPHOS, BILITOT, PROT, ALBUMIN,  in the last 168 hours No results found for this basename: LIPASE, AMYLASE,  in the last 168 hours No results found for this basename: AMMONIA,  in the last 168 hours CBC:  Recent Labs Lab 01/21/14 1230 01/22/14 0400  WBC 8.0 5.9  NEUTROABS 6.4  --   HGB 12.7 11.5*  HCT 37.8 34.8*  MCV 93.1 93.5  PLT 197 193   Cardiac Enzymes: No results found for this basename: CKTOTAL, CKMB, CKMBINDEX, TROPONINI,  in the last 168 hours BNP (last 3 results) No results found for this basename: PROBNP,  in the last 8760 hours CBG: No results found for this basename: GLUCAP,  in the last 168 hours  Recent Results (from the past 240 hour(s))  URINE CULTURE     Status: None   Collection Time    01/23/14 12:53 PM      Result Value Ref Range Status   Specimen Description URINE, CLEAN CATCH   Final   Special Requests NONE   Final   Culture  Setup  Time     Final   Value: 01/23/2014 18:48     Performed at [place]     Final   Value: >=100,000 COLONIES/ML     Performed at [facility]   Culture     Final   Value: [place]     Performed at [facility]   Report Status PENDING   Incomplete     Studies: No results found.  Scheduled Meds: . amLODipine  5 mg Oral Daily  . aspirin EC  81 mg Oral Daily  . atorvastatin  20 mg Oral Daily  . calcium carbonate  1,250 mg Oral QHS  . enoxaparin (LOVENOX) injection  30 mg Subcutaneous Q24H  . multivitamin with minerals  1 tablet Oral Daily  . piperacillin-tazobactam (ZOSYN)  IV  3.375 g Intravenous 3 times per day  . sertraline  50 mg Oral Daily  .  sodium chloride  3 mL Intravenous Q12H  . sodium chloride  3 mL Intravenous Q12H   Continuous Infusions:   Active Problems:   Syncope    Time spent: >35 minutes    Kinnie Feil  [facility] Pager [phone]. If 7PM-7AM, please contact night-coverage at [email or web address], password [facility] 01/25/2014, 10:24 AM  LOS: 4 days

## 2014-01-25 NOTE — Clinical Social Work Placement (Addendum)
Clinical Social Work Department  CLINICAL SOCIAL WORK PLACEMENT NOTE  08/19/2012  Patient: Alicia [name]  Account Number: [id number]  Admit date: 01/21/14 Clinical Social Worker: Rhea Pink LCSWA Date/time: 01/24/14 11:30 AM  Clinical Social Work is seeking post-discharge placement for this patient at the following level of care: SKILLED NURSING (*CSW will update this form in Epic as items are completed)  02/03/2014 Patient/family provided with [place] Department of Clinical Social Work's list of facilities offering this level of care within the geographic area requested by the patient (or if unable, by the patient's family).  05/25/2015Patient/family informed of their freedom to choose among providers that offer the needed level of care, that participate in Medicare, Medicaid or managed care program needed by the patient, have an available bed and are willing to accept the patient.   Patient/family informed of [facility]' ownership interest in [facility], as well as of the fact that they are under no obligation to receive care at this facility.  PASARR submitted to EDS on existing  PASARR number received from EDS on   FL2 transmitted to all facilities in geographic area requested by pt/family on 02/03/2014 FL2 transmitted to all facilities within larger geographic area on  Patient informed that his/her managed care company has contracts with or will negotiate with certain facilities, including the following:  Patient/family informed of bed offers received: 02/03/2014 Patient chooses bed at [facility] Physician recommends and patient chooses bed at  Patient to be transferred to on 01/26/2014-[name], [place] Patient to be transferred to facility by [name], LCSWA The following physician request were entered in Epic:  Additional Comments:

## 2014-01-25 NOTE — Clinical Social Work Psychosocial (Signed)
Clinical Social Work Department  BRIEF PSYCHOSOCIAL ASSESSMENT  Patient: Alicia [name] Account Number: [id number]  Admit date: 01/21/14 Clinical Social Worker Rhea Pink, MSW Date/Time: 01/24/14 2:00 PM Referred by: Physician Date Referred: 01/23/14 Referred for   SNF Placement   Other Referral:  Interview type: Patient's daughter over the phone  Other interview type: PSYCHOSOCIAL DATA  Living Status family Admitted from facility:  Level of care:  Primary support name: Abner Greenspan Primary support relationship to patient: Daughter Degree of support available:  Strong and vested  CURRENT CONCERNS  Current Concerns   Post-Acute Placement   Other Concerns:  SOCIAL WORK ASSESSMENT / PLAN  CSW spoke with patient's daughter over teh phone to offer support and discuss the recommendations by PT for SNF placement. Patient's daughter reported that the patient has been to [facility] in the past. Patient's daughter reported not being too impressed with [facility] and asked about possible bed availability at [facility]. CSW called [facility]a R[facility] at [facility] and no female beds are available. CSW reported this to the daughter and the daughter reported that she would like her mother to return to [facility]. Patient's daughter thanked CSW for sopport and assistance.   pt re: PT recommendation for SNF.   Pt lives with family  CSW explained placement process and answered questions.   Pt reports [facility]  as her preference    CSW completed FL2 and initiated SNF search.     Assessment/plan status: Information/Referral to [employer]  Other assessment/ plan:  Information/referral to community resources:  SNF     PATIENT'S/FAMILY'S RESPONSE TO PLAN OF CARE:  Pt's daughter  reports she is agreeable to  Her mother going to [facility] in order to increase strength and independence with mobility prior to returning home  Pt verbalized understanding of placement process and appreciation for CSW assist.    Rhea Pink, MSW, [place]

## 2014-01-26 LAB — URINE CULTURE: Colony Count: >=100000

## 2014-01-26 MED ORDER — LEVOFLOXACIN 750 MG PO TABS
750.0000 mg | ORAL_TABLET | Freq: Every day | ORAL | Status: DC
  Filled 2014-01-26: qty 1

## 2014-01-26 MED ORDER — LEVOFLOXACIN 750 MG PO TABS: 750.0000 mg | ORAL_TABLET | Freq: Every day | ORAL | Status: DC

## 2014-01-26 NOTE — Clinical Social Work Note (Signed)
CSW informed patient is ready for D/C to SNF bed at [facility]. CSW contacted [facility] and confirmed bed availability. CSW attempted to reach patient's daughter [name], and son Ollen Gross by phone, however both unavailable. CSW left message for follow-up. CSW met with patient who was pleasantly confused. CSW faxed d/c summary to [facility] and made RN aware of report and room number. CSW prepared and placed d/c packet in patient's shadow chart. CSW to arrange transportation via [place].  No further needs. CSW signing off.   [place], [place] Weekend Clinical Social Worker [phone]

## 2014-01-26 NOTE — Progress Notes (Signed)
Report given to Energy manager at [facility].

## 2014-01-26 NOTE — Discharge Summary (Signed)
Physician Discharge Summary  Alicia [name] [id number] DOB: 08-17-1923 DOA: 01/21/2014  PCP: Shellia Carwin, PA-C  Admit date: 01/21/2014 Discharge date: 01/26/2014  Time spent: >35 minutes  Recommendations for Outpatient Follow-up:  SNF F/u with PCP in 1-2 weeks post rehab  Discharge Diagnoses:  Active Problems:   Syncope   Discharge Condition: stable  Diet recommendation: heart healthy   Filed Weights   01/24/14 0400 01/25/14 0400 01/26/14 0500  Weight: 58.605 kg (129 lb 3.2 oz) 59.512 kg (131 lb 3.2 oz) 60.192 kg (132 lb 11.2 oz)    History of present illness:  80 y/o female with PMH of HTN, anxiety, dementia, parkinson's disease, frequent fall, syncope recent UTI,pnuemonia presented with syncopal episode   Hospital Course:  1. Syncope ? Vasovagal happened while having BM; neuro exam no focal expect parkinson's  -may still have "+"UTI; CXR: Small amount of patchy infiltrate remains in the left base; EEG no seizure  -echo: ejection fraction was 65%. Wall motion was normal, diastolic dysfunction;  -no new episodes; improved IV atx for infections; PT eval: SNF; hold ditropan  2. Frequent fall, likely parkinson's +dementia, UTI  -no new episodes of fall; PT eval: SNF  3. Parkinson disease with dementia  -patient is confused at baseline; cont home regimen   4. Hypertension  -Continue amlodipine 5. UTI, repeat urine cultures: klebsiella sens to quinolones;  -patient had some weakness listed as an allergy to quinolones in the past; no significant allergic reaction; d/w patient, her family; agreed to cont quinolones; monitor   Overall proghnosis remains poor due to advanced dementia, infection complications, frequent falls  -patient is DNR; likely needs hospice care in near future; called updated her family; they agreed with the plan of care   Procedures:  none (i.e. Studies not automatically included, echos, thoracentesis, etc; not  x-rays)  Consultations:  none  Discharge Exam: Filed Vitals:   01/26/14 0736  BP: 152/62  Pulse: 74  Temp: 98.2 F (36.8 C)  Resp: 17    General: alert, intermittent confused at baseline  Cardiovascular: s1,s2 rrr Respiratory: CTA BL  Discharge Instructions  Discharge Instructions   Diet - low sodium heart healthy    Complete by:  As directed      Discharge instructions    Complete by:  As directed   Please follow up with primary care doctor in 1-2 weeks     Increase activity slowly    Complete by:  As directed             Medication List    STOP taking these medications       cephALEXin 500 MG capsule  Commonly known as:  KEFLEX     oxybutynin 10 MG 24 hr tablet  Commonly known as:  DITROPAN-XL      TAKE these medications       amLODipine 2.5 MG tablet  Commonly known as:  NORVASC  Take 2.5 mg by mouth daily.     aspirin EC 81 MG tablet  Take 81 mg by mouth daily.     atorvastatin 20 MG tablet  Commonly known as:  LIPITOR  Take 20 mg by mouth daily.     calcium carbonate 600 MG Tabs tablet  Commonly known as:  OS-CAL  Take 600 mg by mouth at bedtime.     levofloxacin 750 MG tablet  Commonly known as:  LEVAQUIN  Take 1 tablet (750 mg total) by mouth daily.     Memantine HCl ER 7  MG Cp24  Commonly known as:  NAMENDA XR  Take 1 capsule (7 mg total) by mouth daily. To be used with free 30 day trial offer.     multivitamin with minerals Tabs tablet  Take 1 tablet by mouth daily.     polyethylene glycol packet  Commonly known as:  MIRALAX / GLYCOLAX  Take 17 g by mouth daily as needed for mild constipation. With 8 oz of liquid daily.     sertraline 50 MG tablet  Commonly known as:  ZOLOFT  Take 50 mg by mouth daily.       Allergies  Allergen Reactions  . Iohexol Rash  . Ciprofloxacin Other (See Comments)    weakness  . Nsaids     Renal insufficiency  . Sulfa Antibiotics Hives and Itching  . Ivp Dye [Iodinated Diagnostic Agents]  Rash       Follow-up Information   Follow up with [name] P, PA-C In 2 weeks.   Specialty:  Physician Assistant   Contact information:   [place] [id number] [phone]        The results of significant diagnostics from this hospitalization (including imaging, microbiology, ancillary and laboratory) are listed below for reference.    Significant Diagnostic Studies: Dg Chest 2 View  01/22/2014   CLINICAL DATA:  Cough  EXAM: CHEST  2 VIEW  COMPARISON:  Jan 12, 2014 and December 18, 2013  FINDINGS: The right lung is now clear. There remains mild patchy infiltrate in the left base, essentially stable compared to most recent prior study. No new opacity. Heart size and pulmonary vascularity are normal. No adenopathy. No bone lesions.  IMPRESSION: Small amount of patchy infiltrate remains in the left base. Lungs elsewhere are clear. No new opacity.   Electronically Signed   By: Lowella Grip M.D.   On: 01/22/2014 10:10   Ct Head Wo Contrast  01/12/2014   CLINICAL DATA:  80 year old female with altered mental status and near syncope. Neck pain.  EXAM: CT HEAD WITHOUT CONTRAST  CT CERVICAL SPINE WITHOUT CONTRAST  TECHNIQUE: Multidetector CT imaging of the head and cervical spine was performed following the standard protocol without intravenous contrast. Multiplanar CT image reconstructions of the cervical spine were also generated.  COMPARISON:  12/25/2013 brain MR. 05/20/2012 head and cervical spine CT.  FINDINGS: CT HEAD FINDINGS  Atrophy and chronic small-vessel white matter ischemic changes are again noted.  No acute intracranial abnormalities are identified, including mass lesion or mass effect, hydrocephalus, extra-axial fluid collection, midline shift, hemorrhage, or acute infarction. The visualized bony calvarium is unremarkable.  A small amount of fluid in the sphenoid sinuses again noted.  CT CERVICAL SPINE FINDINGS  Normal alignment again noted.  There is no evidence  of acute fracture, subluxation or prevertebral soft tissue swelling.  Moderate to severe degenerative disc disease from C3-C6 again noted.  No focal bony lesions are identified.  The soft tissue structures are unremarkable.  IMPRESSION: No evidence of acute intracranial or acute cervical spine abnormality.  Atrophy and chronic small-vessel white matter ischemic changes.  Moderate to severe degenerative disc disease from C3-C6.   Electronically Signed   By: Hassan Rowan M.D.   On: 01/12/2014 17:15   Ct Cervical Spine Wo Contrast  01/12/2014   CLINICAL DATA:  80 year old female with altered mental status and near syncope. Neck pain.  EXAM: CT HEAD WITHOUT CONTRAST  CT CERVICAL SPINE WITHOUT CONTRAST  TECHNIQUE: Multidetector CT imaging of the head  and cervical spine was performed following the standard protocol without intravenous contrast. Multiplanar CT image reconstructions of the cervical spine were also generated.  COMPARISON:  12/25/2013 brain MR. 05/20/2012 head and cervical spine CT.  FINDINGS: CT HEAD FINDINGS  Atrophy and chronic small-vessel white matter ischemic changes are again noted.  No acute intracranial abnormalities are identified, including mass lesion or mass effect, hydrocephalus, extra-axial fluid collection, midline shift, hemorrhage, or acute infarction. The visualized bony calvarium is unremarkable.  A small amount of fluid in the sphenoid sinuses again noted.  CT CERVICAL SPINE FINDINGS  Normal alignment again noted.  There is no evidence of acute fracture, subluxation or prevertebral soft tissue swelling.  Moderate to severe degenerative disc disease from C3-C6 again noted.  No focal bony lesions are identified.  The soft tissue structures are unremarkable.  IMPRESSION: No evidence of acute intracranial or acute cervical spine abnormality.  Atrophy and chronic small-vessel white matter ischemic changes.  Moderate to severe degenerative disc disease from C3-C6.   Electronically Signed   By:  Hassan Rowan M.D.   On: 01/12/2014 17:15   Dg Chest Port 1 View  01/12/2014   CLINICAL DATA:  Short of breath.  EXAM: PORTABLE CHEST - 1 VIEW  COMPARISON:  12/18/2013  FINDINGS: Left base are opacity is seen in the prior study has improved. There are prominent interstitial and vascular markings at the lung bases, left greater than right, accentuated by low lung volumes. Probable additional basilar atelectasis. No convincing acute infiltrate and no pulmonary edema. No pleural effusion or pneumothorax.  Cardiac silhouette is normal in size. Normal mediastinal and hilar contours.  IMPRESSION: No convincing acute cardiopulmonary disease. Lung base opacity seen on the prior study has improved.   Electronically Signed   By: Lajean Manes M.D.   On: 01/12/2014 15:27    Microbiology: Recent Results (from the past 240 hour(s))  URINE CULTURE     Status: None   Collection Time    01/23/14 12:53 PM      Result Value Ref Range Status   Specimen Description URINE, CLEAN CATCH   Final   Special Requests NONE   Final   Culture  Setup Time     Final   Value: 01/23/2014 18:48     Performed at [facility]     Final   Value: >=100,000 COLONIES/ML     Performed at [facility]   Culture     Final   Value: KLEBSIELLA OXYTOCA     Performed at [facility]   Report Status 01/26/2014 FINAL   Final   Organism ID, Bacteria KLEBSIELLA OXYTOCA   Final     Labs: Basic Metabolic Panel:  Recent Labs Lab 01/21/14 1230 01/22/14 0400  NA 141 141  K 4.5 4.0  CL 103 102  CO2 24 26  GLUCOSE 104* 109*  BUN 36* 36*  CREATININE 1.35* 1.45*  CALCIUM 9.5 9.2   Liver Function Tests: No results found for this basename: AST, ALT, ALKPHOS, BILITOT, PROT, ALBUMIN,  in the last 168 hours No results found for this basename: LIPASE, AMYLASE,  in the last 168 hours No results found for this basename: AMMONIA,  in the last 168 hours CBC:  Recent Labs Lab 01/21/14 1230 01/22/14 0400   WBC 8.0 5.9  NEUTROABS 6.4  --   HGB 12.7 11.5*  HCT 37.8 34.8*  MCV 93.1 93.5  PLT 197 193   Cardiac Enzymes: No results  found for this basename: CKTOTAL, CKMB, CKMBINDEX, TROPONINI,  in the last 168 hours BNP: BNP (last 3 results) No results found for this basename: PROBNP,  in the last 8760 hours CBG: No results found for this basename: GLUCAP,  in the last 168 hours     Signed:  Kinnie Feil  [facility] 01/26/2014, 9:40 AM

## 2014-01-27 ENCOUNTER — Encounter: Payer: Self-pay | Admitting: Adult Health

## 2014-01-27 ENCOUNTER — Encounter: Payer: Self-pay | Admitting: *Deleted

## 2014-01-27 ENCOUNTER — Non-Acute Institutional Stay (SKILLED_NURSING_FACILITY): Payer: Medicare Other | Admitting: Adult Health

## 2014-01-27 DIAGNOSIS — F32A Depression, unspecified: Secondary | ICD-10-CM

## 2014-01-27 DIAGNOSIS — I10 Essential (primary) hypertension: Secondary | ICD-10-CM

## 2014-01-27 DIAGNOSIS — E785 Hyperlipidemia, unspecified: Secondary | ICD-10-CM

## 2014-01-27 DIAGNOSIS — F3289 Other specified depressive episodes: Secondary | ICD-10-CM

## 2014-01-27 DIAGNOSIS — F0390 Unspecified dementia without behavioral disturbance: Secondary | ICD-10-CM

## 2014-01-27 DIAGNOSIS — R55 Syncope and collapse: Secondary | ICD-10-CM

## 2014-01-27 DIAGNOSIS — F329 Major depressive disorder, single episode, unspecified: Secondary | ICD-10-CM

## 2014-01-27 DIAGNOSIS — N390 Urinary tract infection, site not specified: Secondary | ICD-10-CM

## 2014-01-27 NOTE — Progress Notes (Signed)
Patient ID: Alicia [name], female   DOB: 14-Sep-1922, 80 y.o.   MRN: [id number]               PROGRESS NOTE  DATE: 01/27/2014  FACILITY: Nursing Home Location: [facility]  LEVEL OF CARE: SNF (31)  Acute Visit  CHIEF COMPLAINT:  Follow-up Hospitalization  HISTORY OF PRESENT ILLNESS:  This is an 80 year old female who has been admitted to [facility] on 01/25/14 from [facility] with Syncope probably Vasovagal while having a BM. She has been admitted for a short-term rehabilitation.  REASSESSMENT OF ONGOING PROBLEM(S):  HTN: Pt 's HTN remains stable.  Denies CP, sob, DOE, pedal edema, headaches, dizziness or visual disturbances.  No complications from the medications currently being used.  Last BP : 144/74  DEMENTIA: The dementia remaines stable and continues to function adequately in the current living environment with supervision.  The patient has had little changes in behavior. No complications noted from the medications presently being used.  UTI: The UTI remains stable.  The patient denies ongoing suprapubic pain, flank pain, dysuria, urinary frequency, urinary hesitancy or hematuria.  No complications reported from the current antibiotic being used.   PAST MEDICAL HISTORY : Reviewed.  No changes/see problem list  CURRENT MEDICATIONS: Reviewed per MAR/see medication list  REVIEW OF SYSTEMS:  GENERAL: no change in appetite, no fatigue, no weight changes, no fever, chills or weakness RESPIRATORY: no cough, SOB, DOE, wheezing, hemoptysis CARDIAC: no chest pain, edema or palpitations GI: no abdominal pain, diarrhea, constipation, heart burn, nausea or vomiting  PHYSICAL EXAMINATION  GENERAL: no acute distress, normal body habitus EYES: conjunctivae normal, sclerae normal, normal eye lids NECK: supple, trachea midline, no neck masses, no thyroid tenderness, no thyromegaly LYMPHATICS: no LAN in the neck, no supraclavicular LAN RESPIRATORY:  breathing is even & unlabored, BS CTAB CARDIAC: RRR, no murmur,no extra heart sounds, no edema GI: abdomen soft, normal BS, no masses, no tenderness, no hepatomegaly, no splenomegaly EXTREMITIES: able to move all 4 extremities PSYCHIATRIC: the patient is alert & oriented to person, affect & behavior appropriate  LABS/RADIOLOGY: Labs reviewed: Basic Metabolic Panel:  Recent Labs  01/12/14 1502 01/21/14 1230 01/22/14 0400  NA 138 141 141  K 5.0 4.5 4.0  CL 100 103 102  CO2 21 24 26   GLUCOSE 99 104* 109*  BUN 33* 36* 36*  CREATININE 1.27* 1.35* 1.45*  CALCIUM 9.4 9.5 9.2   Liver Function Tests:  Recent Labs  12/18/13 2241 12/19/13 1610  AST 36 30  ALT 25 25  ALKPHOS 59 72  BILITOT 0.4 0.3  PROT 6.0 5.5*  ALBUMIN 3.1* 2.6*    Recent Labs  12/18/13 2241  LIPASE 19   CBC:  Recent Labs  12/19/13 1610  01/12/14 1502 01/21/14 1230 01/22/14 0400  WBC 7.9  < > 8.8 8.0 5.9  NEUTROABS 6.6  --  7.3 6.4  --   HGB 11.2*  < > 9.2* 12.7 11.5*  HCT 33.9*  < > 40.2 37.8 34.8*  MCV 93.6  < > 95.0 93.1 93.5  PLT 155  < > PLATELET CLUMPS NOTED ON SMEAR, COUNT APPEARS ADEQUATE 197 193  < > = values in this interval not displayed.   Cardiac Enzymes:  Recent Labs  01/12/14 1502  TROPONINI <0.30   CBG:  Recent Labs  01/12/14 1700  GLUCAP 89     ASSESSMENT/PLAN:  Syncope - probably vasovagal while having BM; for rehabilitation UTI - continue Levofloxacin  Dementia - continue Namenda Hypertension - continue Amlodipine Hyperlipidemia - continue Atorvastatin Depression - continue Zoloft   CPT CODE: 70263  Seth Bake - NP [facility] [phone]

## 2014-01-28 ENCOUNTER — Non-Acute Institutional Stay (SKILLED_NURSING_FACILITY): Payer: Medicare Other | Admitting: Internal Medicine

## 2014-01-28 DIAGNOSIS — F0390 Unspecified dementia without behavioral disturbance: Secondary | ICD-10-CM

## 2014-01-28 DIAGNOSIS — G20 Parkinson's disease: Secondary | ICD-10-CM

## 2014-01-28 DIAGNOSIS — I10 Essential (primary) hypertension: Secondary | ICD-10-CM

## 2014-01-28 DIAGNOSIS — J189 Pneumonia, unspecified organism: Secondary | ICD-10-CM

## 2014-01-28 NOTE — Progress Notes (Signed)
HISTORY & PHYSICAL  DATE: 01/28/2014   FACILITY: [facility]  LEVEL OF CARE: SNF (31)  ALLERGIES:  Allergies  Allergen Reactions  . Iohexol Rash  . Ciprofloxacin Other (See Comments)    weakness  . Nsaids     Renal insufficiency  . Sulfa Antibiotics Hives and Itching  . Ivp Dye [Iodinated Diagnostic Agents] Rash    CHIEF COMPLAINT:  Manage Parkinson's disease, hypertension and dementia  HISTORY OF PRESENT ILLNESS: 80 year old Caucasian female was hospitalized after a syncopal episode. After hospitalization she is admitted to this facility for short-term rehabilitation.  PARKINSON'S DISEASE: pt's Parkinson's disease is stable.  Denies progression of sx recently.  Pt is tolerating Parkinson's disease medications without any complications.  HTN: Pt 's HTN remains stable.  Denies CP, sob, DOE, pedal edema, headaches, dizziness or visual disturbances.  No complications from the medications currently being used.  Last BP : 170/94.  DEMENTIA: The dementia remaines stable and continues to function adequately in the current living environment with supervision.  The patient has had little changes in behavior. No complications noted from the medications presently being used.  PAST MEDICAL HISTORY :  Past Medical History  Diagnosis Date  . Dementia     significant  . Renal disease   . Memory loss 12/10/2012  . Depression 12/10/2012  . Anxiety state, unspecified 12/10/2012  . Spells 12/10/2012  . Essential hypertension, benign 12/10/2012  . Other and unspecified hyperlipidemia 12/10/2012  . Loss of weight 12/10/2012  . Carotid artery stenosis   . Ovarian cancer     treated with surgery & chemo  . Ankle fracture, right   . Unstable gait     mild  . Syncope 12/10/2012    Recurrent, sporadic episodes  . Carotid artery disease   . Parkinson disease     PAST SURGICAL HISTORY: Past Surgical History  Procedure Laterality Date  . Vesicovaginal fistula  closure w/ tah  2005  . Oophorectomy  2007    for ovarian cancer  . Prolapsed uterine fibroid ligation    . Appendectomy    . Cataract extraction Bilateral   . Implantable loop recorder  11/14    MDT LINQ implanted for recurrent unexplained syncope by Dr Rayann Heman  . Abdominal hysterectomy      SOCIAL HISTORY:  reports that she has never smoked. She does not have any smokeless tobacco history on file. She reports that she does not drink alcohol or use illicit drugs.  FAMILY HISTORY:  Family History  Problem Relation Age of Onset  . Cancer Mother     breast  . Stroke Father     CURRENT MEDICATIONS: Reviewed per MAR/see medication list  REVIEW OF SYSTEMS:  See HPI otherwise 14 point ROS is negative.  PHYSICAL EXAMINATION  VS:  See VS section  GENERAL: no acute distress, normal body habitus EYES: conjunctivae normal, sclerae normal, normal eye lids MOUTH/THROAT: lips without lesions,no lesions in the mouth,tongue is without lesions,uvula elevates in midline NECK: supple, trachea midline, no neck masses, no thyroid tenderness, no thyromegaly LYMPHATICS: no LAN in the neck, no supraclavicular LAN RESPIRATORY: breathing is even & unlabored, BS CTAB CARDIAC: RRR, no murmur,no extra heart sounds, no edema GI:  ABDOMEN: abdomen soft, normal BS, no masses, no tenderness  LIVER/SPLEEN: no hepatomegaly, no splenomegaly MUSCULOSKELETAL: HEAD: normal to inspection & palpation BACK: no kyphosis, scoliosis or spinal processes tenderness EXTREMITIES: LEFT UPPER EXTREMITY: full range of motion, normal strength &  tone RIGHT UPPER EXTREMITY:  full range of motion, normal strength & tone LEFT LOWER EXTREMITY:  range of motion unable to perform, decreased strength & tone RIGHT LOWER EXTREMITY: range of motion unable to perform, decreased strength & tone PSYCHIATRIC: the patient is alert & oriented to person, affect & behavior appropriate  LABS/RADIOLOGY:  Labs reviewed: Basic Metabolic  Panel:  Recent Labs  01/12/14 1502 01/21/14 1230 01/22/14 0400  NA 138 141 141  K 5.0 4.5 4.0  CL 100 103 102  CO2 21 24 26   GLUCOSE 99 104* 109*  BUN 33* 36* 36*  CREATININE 1.27* 1.35* 1.45*  CALCIUM 9.4 9.5 9.2   Liver Function Tests:  Recent Labs  12/18/13 2241 12/19/13 1610  AST 36 30  ALT 25 25  ALKPHOS 59 72  BILITOT 0.4 0.3  PROT 6.0 5.5*  ALBUMIN 3.1* 2.6*    Recent Labs  12/18/13 2241  LIPASE 19   CBC:  Recent Labs  12/19/13 1610  01/12/14 1502 01/21/14 1230 01/22/14 0400  WBC 7.9  < > 8.8 8.0 5.9  NEUTROABS 6.6  --  7.3 6.4  --   HGB 11.2*  < > 9.2* 12.7 11.5*  HCT 33.9*  < > 40.2 37.8 34.8*  MCV 93.6  < > 95.0 93.1 93.5  PLT 155  < > PLATELET CLUMPS NOTED ON SMEAR, COUNT APPEARS ADEQUATE 197 193  < > = values in this interval not displayed.  Cardiac Enzymes:  Recent Labs  01/12/14 1502  TROPONINI <0.30   CBG:  Recent Labs  01/12/14 1700  GLUCAP 89    Transthoracic Echocardiography  Patient:    Solina, Heron MR #:       [id number] Study Date: 01/22/2014 Gender:     F Age:        73 Height:     165.1cm Weight:     58.2kg BSA:        1.46m^2 Pt. Status: Room:       3W14C    SONOGRAPHER  [facility], RDCS  ATTENDING    Daleen Bo, [place]ug[place]  ORDERING     Buriev, [place]ug[place]  REFERRING    [facility], [place]  ADMITTING    Mikhail, [facility]  PERFORMING   [facility], Inpatient cc:  ------------------------------------------------------------ LV EF: 65%  ------------------------------------------------------------ Indications:      Syncope 780.2.  ------------------------------------------------------------ History:   Risk factors:  Hypertension. Dyslipidemia.  ------------------------------------------------------------ Study Conclusions  - Left ventricle: The cavity size was normal. Wall thickness   was increased in a pattern of moderate LVH. The estimated   ejection fraction was 65%. Wall motion was normal; there   were  no regional wall motion abnormalities. Doppler   parameters are consistent with abnormal left ventricular   relaxation (grade 1 diastolic dysfunction). - Aortic valve: Sclerosis without stenosis. Mild   regurgitation. - Mitral valve: Calcium on anterior leaflet, but it moves   well. No significant regurgitation. - Right ventricle: The cavity size was normal. Systolic   function was normal. Transthoracic echocardiography.  M-mode, complete 2D, spectral Doppler, and color Doppler.  Height:  Height: 165.1cm. Height: 65in.  Weight:  Weight: 58.2kg. Weight: 128lb.  Body mass index:  BMI: 21.4kg/m^2.  Body surface area:    BSA: 1.46m^2.  Blood pressure:     174/59.  Patient status:  Inpatient.  Location:  Echo laboratory.  ------------------------------------------------------------  ------------------------------------------------------------ Left ventricle:  The cavity size was normal. Wall thickness was increased in a pattern of moderate LVH. The estimated  ejection fraction was 65%. Wall motion was normal; there were no regional wall motion abnormalities. Doppler parameters are consistent with abnormal left ventricular relaxation (grade 1 diastolic dysfunction).  ------------------------------------------------------------ Aortic valve:  Sclerosis without stenosis.  Doppler:   Mild regurgitation.  ------------------------------------------------------------ Aorta:  Aortic root: The aortic root was normal in size.  ------------------------------------------------------------ Mitral valve:  Calcium on anterior leaflet, but it moves well.  Doppler:   No significant regurgitation.  ------------------------------------------------------------ Left atrium:  The atrium was normal in size.  ------------------------------------------------------------ Right ventricle:  The cavity size was normal. Systolic function was  normal.  ------------------------------------------------------------ Pulmonic valve:    The valve appears to be grossly normal.  Doppler:   No significant regurgitation.  ------------------------------------------------------------ Tricuspid valve:   Structurally normal valve.   Leaflet separation was normal.  Doppler:  Transvalvular velocity was within the normal range.  No regurgitation.  ------------------------------------------------------------ Right atrium:  The atrium was at the upper limits of normal in size.  ------------------------------------------------------------ Pericardium:  There was no pericardial effusion.  ------------------------------------------------------------  2D measurements        Normal  Doppler               Normal Left ventricle                 measurements LVID ED,   33.3 mm     43-52   Left ventricle chord,                         Ea, lat      3.51 cm/ ------- PLAX                           ann, tiss         s LVID ES,     26 mm     23-38   DP chord,                         E/Ea, lat   17.01     ------- PLAX                           ann, tiss FS, chord,   22 %      >29     DP PLAX                           Ea, med      5.04 cm/ ------- LVPW, ED   13.5 mm     ------  ann, tiss         s IVS/LVPW   0.94        <1.3    DP ratio, ED                      E/Ea, med   11.85     ------- Ventricular septum             ann, tiss IVS, ED    12.7 mm     ------  DP Aorta                          Aortic valve Root diam,   28 mm     ------  Regurg PHT    624 ms  ------- ED  Mitral valve Left atrium                    Peak E vel   59.7 cm/ ------- AP dim       27 mm     ------                    s AP dim     1.65 cm/m^2 <2.2    Peak A vel    109 cm/ ------- index                                            s                                Deceleratio   292 ms  150-230                                n time                                 Peak E/A      0.5     -------                                ratio                                Systemic veins                                Estimated       3 mm  -------                                CVP               Hg                                Right ventricle                                Sa vel, lat  11.4 cm/ -------                                ann, tiss         s                                DP CT HEAD WITHOUT CONTRAST   CT CERVICAL SPINE WITHOUT CONTRAST   TECHNIQUE: Multidetector CT imaging of the head and cervical spine was performed following the standard protocol without intravenous contrast. Multiplanar CT image reconstructions of the cervical spine were also generated.   COMPARISON:  12/25/2013 brain MR. 05/20/2012 head and  cervical spine CT.   FINDINGS: CT HEAD FINDINGS   Atrophy and chronic small-vessel white matter ischemic changes are again noted.   No acute intracranial abnormalities are identified, including mass lesion or mass effect, hydrocephalus, extra-axial fluid collection, midline shift, hemorrhage, or acute infarction. The visualized bony calvarium is unremarkable.   A small amount of fluid in the sphenoid sinuses again noted.   CT CERVICAL SPINE FINDINGS   Normal alignment again noted.   There is no evidence of acute fracture, subluxation or prevertebral soft tissue swelling.   Moderate to severe degenerative disc disease from C3-C6 again noted.   No focal bony lesions are identified.   The soft tissue structures are unremarkable.   IMPRESSION: No evidence of acute intracranial or acute cervical spine abnormality.   Atrophy and chronic small-vessel white matter ischemic changes.   Moderate to severe degenerative disc disease from C3-C6.     CHEST  2 VIEW   COMPARISON:  Jan 12, 2014 and December 18, 2013   FINDINGS: The right lung is now clear. There remains mild patchy infiltrate in the left base,  essentially stable compared to most recent prior study. No new opacity. Heart size and pulmonary vascularity are normal. No adenopathy. No bone lesions.   IMPRESSION: Small amount of patchy infiltrate remains in the left base. Lungs elsewhere are clear. No new opacity.   ASSESSMENT/PLAN:  Parkinson's disease-continue supportive care Hypertension-blood pressure is elevated. Will monitor. Dementia-continue namenda XR. Pneumonia -- on Levaquin UTI-on Levaquin Constipation-continue MiraLax Check CBC and BMP  I have reviewed patient's medical records received at admission/from hospitalization.  CPT CODE: 88502  Gayani Y Dasanayaka, [place] [phone]

## 2014-02-11 ENCOUNTER — Non-Acute Institutional Stay (SKILLED_NURSING_FACILITY): Payer: Medicare Other | Admitting: Adult Health

## 2014-02-11 DIAGNOSIS — E785 Hyperlipidemia, unspecified: Secondary | ICD-10-CM

## 2014-02-11 DIAGNOSIS — I10 Essential (primary) hypertension: Secondary | ICD-10-CM

## 2014-02-11 DIAGNOSIS — F0390 Unspecified dementia without behavioral disturbance: Secondary | ICD-10-CM

## 2014-02-11 DIAGNOSIS — G20 Parkinson's disease: Secondary | ICD-10-CM

## 2014-02-11 DIAGNOSIS — R55 Syncope and collapse: Secondary | ICD-10-CM

## 2014-02-11 DIAGNOSIS — F3289 Other specified depressive episodes: Secondary | ICD-10-CM

## 2014-02-11 DIAGNOSIS — F329 Major depressive disorder, single episode, unspecified: Secondary | ICD-10-CM

## 2014-02-11 DIAGNOSIS — F32A Depression, unspecified: Secondary | ICD-10-CM

## 2014-02-12 ENCOUNTER — Encounter: Payer: Self-pay | Admitting: Adult Health

## 2014-02-12 NOTE — Progress Notes (Signed)
Patient ID: Alicia [name], female   DOB: 1923/05/03, 80 y.o.   MRN: [id number]                 PROGRESS NOTE  DATE:  02/11/14  FACILITY: Nursing Home Location: [facility]  LEVEL OF CARE: SNF (31)  Acute Visit  CHIEF COMPLAINT:  Discharge Notes  HISTORY OF PRESENT ILLNESS:  This is an 80 year old female who is for discharge home with Home health PT, OT and Nursing. She has been admitted to [facility] on 01/25/14 from [facility] with Syncope probably Vasovagal while having a BM. Patient was admitted to this facility for short-term rehabilitation after the patient's recent hospitalization.  Patient has completed SNF rehabilitation and therapy has cleared the patient for discharge.   REASSESSMENT OF ONGOING PROBLEM(S):  HTN: Pt 's HTN remains stable.  Denies CP, sob, DOE, pedal edema, headaches, dizziness or visual disturbances.  No complications from the medications currently being used.  Last BP : 130/70  PARKINSON'S DISEASE: pt's Parkinson's disease is stable.  Denies progression of sx recently.  Pt is not on any medications.     DEMENTIA: The dementia remaines stable and continues to function adequately in the current living environment with supervision.  The patient has had little changes in behavior. No complications noted from the medications presently being used.  DEPRESSION: The depression remains stable. Patient denies ongoing feelings of sadness, insomnia, anedhonia or lack of appetite. No complications reported from the medications currently being used. Staff do not report behavioral problems.   PAST MEDICAL HISTORY : Reviewed.  No changes/see problem list  CURRENT MEDICATIONS: Reviewed per MAR/see medication list  REVIEW OF SYSTEMS:  GENERAL: no change in appetite, no fatigue, no weight changes, no fever, chills or weakness RESPIRATORY: no cough, SOB, DOE, wheezing, hemoptysis CARDIAC: no chest pain, edema or palpitations GI: no  abdominal pain, diarrhea, constipation, heart burn, nausea or vomiting  PHYSICAL EXAMINATION  GENERAL: no acute distress, normal body habitus NECK: supple, trachea midline, no neck masses, no thyroid tenderness, no thyromegaly LYMPHATICS: no LAN in the neck, no supraclavicular LAN RESPIRATORY: breathing is even & unlabored, BS CTAB CARDIAC: RRR, no murmur,no extra heart sounds, no edema GI: abdomen soft, normal BS, no masses, no tenderness, no hepatomegaly, no splenomegaly EXTREMITIES: able to move all 4 extremities; able to ambulate PSYCHIATRIC: the patient is alert & oriented to person, affect & behavior appropriate  LABS/RADIOLOGY: 01/29/14  WBC 4.7 hemoglobin 12.0 hematocrit 38.1 sodium 139 potassium 4.3 glucose 95 BUN 27 creatinine 1.2 calcium 8.8 Labs reviewed: Basic Metabolic Panel:  Recent Labs  01/12/14 1502 01/21/14 1230 01/22/14 0400  NA 138 141 141  K 5.0 4.5 4.0  CL 100 103 102  CO2 21 24 26   GLUCOSE 99 104* 109*  BUN 33* 36* 36*  CREATININE 1.27* 1.35* 1.45*  CALCIUM 9.4 9.5 9.2   Liver Function Tests:  Recent Labs  12/18/13 2241 12/19/13 1610  AST 36 30  ALT 25 25  ALKPHOS 59 72  BILITOT 0.4 0.3  PROT 6.0 5.5*  ALBUMIN 3.1* 2.6*    Recent Labs  12/18/13 2241  LIPASE 19   CBC:  Recent Labs  12/19/13 1610  01/12/14 1502 01/21/14 1230 01/22/14 0400  WBC 7.9  < > 8.8 8.0 5.9  NEUTROABS 6.6  --  7.3 6.4  --   HGB 11.2*  < > 9.2* 12.7 11.5*  HCT 33.9*  < > 40.2 37.8 34.8*  MCV 93.6  < >  95.0 93.1 93.5  PLT 155  < > PLATELET CLUMPS NOTED ON SMEAR, COUNT APPEARS ADEQUATE 197 193  < > = values in this interval not displayed.   Cardiac Enzymes:  Recent Labs  01/12/14 1502  TROPONINI <0.30   CBG:  Recent Labs  01/12/14 1700  GLUCAP 89     ASSESSMENT/PLAN:  Syncope -  for home health PT, OT and nursing  Dementia - continue Namenda Hypertension - continue Amlodipine Hyperlipidemia - continue Atorvastatin Depression - continue  Zoloft Parkinson's disease - stable; no medication  I have filled out patient's discharge paperwork and written prescriptions.  Patient will receive home health PT, OT and Nursing.  Total discharge time: Less than 30 minutes Discharge time involved coordination of the discharge process with Education officer, museum, nursing staff and therapy department. Medical justification for home health services verified.   CPT CODE: 92010  Seth Bake - NP [facility] [phone]

## 2014-02-14 DIAGNOSIS — I10 Essential (primary) hypertension: Secondary | ICD-10-CM

## 2014-02-14 DIAGNOSIS — F411 Generalized anxiety disorder: Secondary | ICD-10-CM

## 2014-02-14 DIAGNOSIS — R55 Syncope and collapse: Secondary | ICD-10-CM

## 2014-02-14 DIAGNOSIS — Z87440 Personal history of urinary (tract) infections: Secondary | ICD-10-CM

## 2014-04-02 ENCOUNTER — Ambulatory Visit (INDEPENDENT_AMBULATORY_CARE_PROVIDER_SITE_OTHER): Payer: Medicare Other | Admitting: Neurology

## 2014-04-02 ENCOUNTER — Encounter: Payer: Self-pay | Admitting: Neurology

## 2014-04-02 VITALS — BP 128/92 | HR 72 | Ht 62.0 in | Wt 132.0 lb

## 2014-04-02 DIAGNOSIS — F0390 Unspecified dementia without behavioral disturbance: Secondary | ICD-10-CM

## 2014-04-02 DIAGNOSIS — R55 Syncope and collapse: Secondary | ICD-10-CM

## 2014-04-02 DIAGNOSIS — N390 Urinary tract infection, site not specified: Secondary | ICD-10-CM

## 2014-04-02 DIAGNOSIS — I498 Other specified cardiac arrhythmias: Secondary | ICD-10-CM

## 2014-04-02 DIAGNOSIS — R001 Bradycardia, unspecified: Secondary | ICD-10-CM

## 2014-04-02 MED ORDER — MEMANTINE HCL ER 28 MG PO CP24: 28.0000 mg | ORAL_CAPSULE | Freq: Every day | ORAL | Status: DC

## 2014-04-02 NOTE — Patient Instructions (Signed)
We will continue with Namenda XR 28 mg daily.

## 2014-04-02 NOTE — Progress Notes (Signed)
Subjective:    Patient ID: Alicia [name] is a 80 y.o. female.  HPI    Interim history:   Alicia [name] is a very pleasant 80 year old right-handed woman with an underlying medical history of cancer, tremor, depression, anxiety, hyperlipidemia, syncope, weight loss and memory loss, who presents for followup consultation of her memory loss and intermittent confusion. She is accompanied by her daughter-in-law, Alicia [name], again today. I last saw her on  11/11/2013, at which time we talked about her confusion and cognitive decline she has been off of Aricept we tried her on Namenda XR, starting at 7 mg daily and I provided her with a starter packet trial card. In the interim, the patient was admitted to the hospital twice. In April 2015 she was admitted with nausea, vomiting and fever. On 01/12/2014 she was taken to the emergency room with altered mental status. She was not admitted to the hospital. On 01/21/2014 she was admitted to the hospital after syncopal spell. She was discharged to skilled nursing facility and was discharged from there in June and is now back to home with son, daughter in law and their 2 boys. They had a tough transition back home, but she seems stable now. She has been able to go up to Namenda XR 28 mg daily, gradually since June and while there is no significant cognitive improvement, she seems to be tolerating it. She saw the urologist this morning, and is now again on cipro. She had a CTH wo contrast and a C spine CT on 01/12/14: No evidence of acute intracranial or acute cervical spine  abnormality. Atrophy and chronic small-vessel white matter ischemic changes. Moderate to severe degenerative disc disease from C3-C6. She had a brain MRI wo contrast on 12/25/13: Atrophy and small vessel disease. No acute intracranial findings.   I saw her on 06/21/2013, at which time we lowered the Aricept again to 5 mg, due to concern for bradycardia. I also suggested we consider Namenda XR down  the road. At the time, her daughter in-law reported no recent issues with confusion or pre-syncope. She had low BP one night and she had low K and she was not given her Aricept and her lisinopril. She had also been sleep walking or had some confusion: She left the house one day in the early morning and they had to call 911 and she was found walking on a neighboring street and sat down in a neighbor's garage. She has needed more prompting with simple tasks. They had an aid daily from 11 AM to 2 PM, but the aid quit on the day of her appointment in 10/14.  In the interim, her daughter called on 07/11/2013 reporting a syncopal spell at home. We had her stop the Aricept. In November 2014 she had an implantable loop monitor placed under Dr. Rayann Heman, with monitoring through Dr. Wynonia Lawman. She was seen in followup by Dr. Rayann Heman on 10/24/2013. She was started on low-dose amlodipine.  Today, Alicia [name] reports a significant decline in patient's cognitive function, more misplacing things, more confusion, 2 recent episodes of bedwetting at night. She had a fever 2 days ago and was taken to UC yesterday and was started on a Z pack. They have an aid daily from 11 AM to up to 4 PM. She has never been on Namenda. She has been sleeping more and eating a lot more. She has gained weight. She wanders in the house.  I saw her on 03/12/2013, at which time I increased her Aricept  from 5 mg to 10 mg again. I first met her on 12/10/2012 and she previously followed with Dr. Morene Antu. MRI brain in June 2011 showed chronic microvascular ischemia and generalized atrophy, RPR and B12 were normal in June 2011. She was started on Aricept but discontinued the medication and then re-started it. She was admitted to [facility] in 3/14 for an episode of unresponsiveness. At the time of her first visit I suggested an EEG as well as cardiology evaluation. I did not make any changes in her medications. Her EEG was normal in the awake state.  She had a 30 day Holter, which was normal. She saw Dr. Tollie Eth. He lowered her metoprolol to 25 mg, and she had discontinued her amlodipine per PCP previously. Her Aricept had been decreased in the past d/t concern of bradycardia.  The patient has had some AH and rare VH. They seem to be transformational. She has been sleeping fairly well, but at times she wanders at night but is usually easily redirected.   Her Past Medical History Is Significant For: Past Medical History  Diagnosis Date  . Dementia     significant  . Renal disease   . Memory loss 12/10/2012  . Depression 12/10/2012  . Anxiety state, unspecified 12/10/2012  . Spells 12/10/2012  . Essential hypertension, benign 12/10/2012  . Other and unspecified hyperlipidemia 12/10/2012  . Loss of weight 12/10/2012  . Carotid artery stenosis   . Ovarian cancer     treated with surgery & chemo  . Ankle fracture, right   . Unstable gait     mild  . Syncope 12/10/2012    Recurrent, sporadic episodes  . Carotid artery disease   . Parkinson disease     Her Past Surgical History Is Significant For: Past Surgical History  Procedure Laterality Date  . Vesicovaginal fistula closure w/ tah  2005  . Oophorectomy  2007    for ovarian cancer  . Prolapsed uterine fibroid ligation    . Appendectomy    . Cataract extraction Bilateral   . Implantable loop recorder  11/14    MDT LINQ implanted for recurrent unexplained syncope by Dr Rayann Heman  . Abdominal hysterectomy      Her Family History Is Significant For: Family History  Problem Relation Age of Onset  . Cancer Mother     breast  . Stroke Father     Her Social History Is Significant For: History   Social History  . Marital Status: Married    Spouse Name: N/A    Number of Children: N/A  . Years of Education: N/A   Social History Main Topics  . Smoking status: Never Smoker   . Smokeless tobacco: None  . Alcohol Use: No  . Drug Use: No  . Sexual Activity: No   Other  Topics Concern  . None   Social History Narrative   Lives with son and daughter in law    Her Allergies Are:  Allergies  Allergen Reactions  . Iohexol Rash  . Ciprofloxacin Other (See Comments)    weakness  . Nsaids     Renal insufficiency  . Sulfa Antibiotics Hives and Itching  . Ivp Dye [Iodinated Diagnostic Agents] Rash  :   Her Current Medications Are:  Outpatient Encounter Prescriptions as of 04/02/2014  Medication Sig  . amLODipine (NORVASC) 2.5 MG tablet Take 2.5 mg by mouth daily.  Marland Kitchen aspirin EC 81 MG tablet Take 81 mg by mouth daily.  Marland Kitchen  atorvastatin (LIPITOR) 20 MG tablet Take 20 mg by mouth daily.  . calcium carbonate (OS-CAL) 600 MG TABS tablet Take 600 mg by mouth at bedtime.  . ciprofloxacin (CIPRO) 500 MG tablet Take 250 mg by mouth 2 (two) times daily.  Marland Kitchen loratadine (CLARITIN) 10 MG tablet Take 10 mg by mouth as needed.  . Memantine HCl ER (NAMENDA XR) 7 MG CP24 Take 1 capsule (7 mg total) by mouth daily. To be used with free 30 day trial offer.  . Multiple Vitamin (MULTIVITAMIN WITH MINERALS) TABS Take 1 tablet by mouth daily.  Marland Kitchen oxybutynin (DITROPAN-XL) 10 MG 24 hr tablet Take 10 mg by mouth daily.  . polyethylene glycol (MIRALAX / GLYCOLAX) packet Take 17 g by mouth daily as needed for mild constipation. With 8 oz of liquid daily.  . sertraline (ZOLOFT) 50 MG tablet Take 50 mg by mouth daily.  . [DISCONTINUED] levofloxacin (LEVAQUIN) 750 MG tablet Take 1 tablet (750 mg total) by mouth daily.  :  Review of Systems:  Out of a complete 14 point review of systems, all are reviewed and negative with the exception of these symptoms as listed below:   Review of Systems  HENT: Negative.   Eyes: Negative.   Respiratory: Negative.   Cardiovascular: Negative.   Gastrointestinal: Negative.   Endocrine: Negative.   Genitourinary: Negative.   Musculoskeletal: Negative.   Skin: Negative.   Allergic/Immunologic: Negative.   Neurological: Positive for dizziness and  weakness.       Memory loss  Hematological: Negative.   Psychiatric/Behavioral: Positive for agitation. The patient is nervous/anxious.     Objective:  Neurologic Exam  Physical Exam Physical Examination:   Filed Vitals:   04/02/14 1215  BP: 128/92  Pulse: 72    General Examination: The patient is a very pleasant 80 y.o. female in no acute distress. She is calm and cooperative with the exam. She denies Auditory Hallucinations and Visual Hallucinations.   HEENT: Normocephalic, atraumatic, pupils are equal, round and reactive to light and accommodation. Extraocular tracking shows moderate saccadic breakdown without nystagmus noted. Hearing is impaired. Face is symmetric with mild facial masking and normal facial sensation. There is a mild to moderate intermittent lip, and lower jaw tremor. Neck is supple rigid with intact passive ROM. There are no carotid bruits on auscultation. Oropharynx exam reveals no mouth dryness. No significant airway crowding is noted. Mallampati is class II. Tongue protrudes centrally and palate elevates symmetrically.    Chest: is clear to auscultation without wheezing, rhonchi or crackles noted.  Heart: sounds are regular and normal without murmurs, rubs or gallops noted.   Abdomen: is soft, non-tender and non-distended with normal bowel sounds appreciated on auscultation.  Extremities: There is no pitting edema in the distal lower extremities bilaterally. Pedal pulses are intact.  Skin: is warm and dry with no trophic changes noted.  Musculoskeletal: exam reveals no obvious joint deformities, tenderness or joint swelling or erythema.  Neurologically:  Mental status: The patient is awake and alert, paying very little attention. She is unable to provide the history. She is oriented to: person, place, day of week, not month of year or year. Her memory, attention, language and knowledge are impaired. There is no aphasia, agnosia, apraxia or anomia. There is a  mild degree of bradyphrenia. Speech is mildly hypophonic with no dysarthria noted. Mood is congruent and affect is normal.    On 11/11/13: MMSE: did not do, as she has recently been ill and was not back  to her baseline.    04/02/2014: MMSE: 18/30, CDT was 3/4, AFT was 3/min.  Cranial nerves are as described above under HEENT exam. In addition, shoulder shrug is normal with equal shoulder height noted.  Motor exam: Normal bulk, and strength for age is noted. Tone is not rigid with absence of cogwheeling in the extremities. There is overall mild bradykinesia. There is no drift or rebound. Romberg is negative. Reflexes are 2+ in the upper extremities and 2+ in the lower extremities. Fine motor skills: Finger taps, hand movements, and rapid alternating patting are mildly impaired bilaterally. Foot taps and foot agility are mildly impaired bilaterally. She has an intermittent hand tremor, resting, in the right upper extremity.   Cerebellar testing shows no dysmetria or intention tremor on finger to nose testing. There is no truncal or gait ataxia.   Sensory exam is intact to light touch.    Gait, station and balance: She stands up from the seated position with mild difficulty and needs to push herself up. No veering to one side is noted. No leaning to one side. Posture is mildly stooped. Stance is wide-based. She turns in 3 steps. Tandem walk is not possible. Balance is mildly impaired.   Assessment and Plan:    In summary, Alicia [name] is a very pleasant 80 year old female with a history of advanced dementia without behavioral disturbance and hx of syncopal and pre-syncopal spells, low BP and low heart rate, (implantable loop recorder in place and followed by cardiology), previous report of VH and AH and sleep walking and evidence of recurrent UTIs. She had 2 hospitalizations for syncope and has another UTI now. She may have a mild degree of parkinsonism. I would not recommend trying her on  Parkinson's medication for fear of side effects. [name] agrees. She has been tried on Aricept on more than one occasion and could not tolerate it. She is off of that now. [name] believes that there has been a decline in her memory since then. She seems to be able to tolerate Namenda XR and in fact titrated up to the full dose of 28 mg since she was discharged to home in June.  Thankfully, the patient has a great support system and wonderful family that helps take care of her. She has been able to stay with her family and they have hired sitters that take care of her. I filled out paperwork for their sitters. I would like for her to continue Namenda XR 28 mg daily, and I provided them with a 90 day prescription as well as a 30 day prescription to bridge them. I would like to see her back in about 4 months, sooner if the need arises and encouraged her daughter-in-law to call with any interim questions, concerns, problems or updates or refill requests.

## 2014-04-30 ENCOUNTER — Encounter (HOSPITAL_COMMUNITY): Payer: Self-pay | Admitting: Emergency Medicine

## 2014-04-30 ENCOUNTER — Emergency Department (HOSPITAL_COMMUNITY)
Admission: EM | Admit: 2014-04-30 | Discharge: 2014-04-30 | Disposition: A | Discharge status: Home / Self Care | Payer: Medicare Other | Attending: Emergency Medicine | Admitting: Emergency Medicine

## 2014-04-30 DIAGNOSIS — Z79899 Other long term (current) drug therapy: Secondary | ICD-10-CM | POA: Diagnosis not present

## 2014-04-30 DIAGNOSIS — Z7982 Long term (current) use of aspirin: Secondary | ICD-10-CM | POA: Insufficient documentation

## 2014-04-30 DIAGNOSIS — R5381 Other malaise: Secondary | ICD-10-CM | POA: Insufficient documentation

## 2014-04-30 DIAGNOSIS — F329 Major depressive disorder, single episode, unspecified: Secondary | ICD-10-CM | POA: Insufficient documentation

## 2014-04-30 DIAGNOSIS — R079 Chest pain, unspecified: Secondary | ICD-10-CM | POA: Insufficient documentation

## 2014-04-30 DIAGNOSIS — Z792 Long term (current) use of antibiotics: Secondary | ICD-10-CM | POA: Insufficient documentation

## 2014-04-30 DIAGNOSIS — R5383 Other fatigue: Secondary | ICD-10-CM

## 2014-04-30 DIAGNOSIS — F0390 Unspecified dementia without behavioral disturbance: Secondary | ICD-10-CM | POA: Insufficient documentation

## 2014-04-30 DIAGNOSIS — Z8742 Personal history of other diseases of the female genital tract: Secondary | ICD-10-CM | POA: Insufficient documentation

## 2014-04-30 DIAGNOSIS — I10 Essential (primary) hypertension: Secondary | ICD-10-CM | POA: Insufficient documentation

## 2014-04-30 DIAGNOSIS — G20 Parkinson's disease: Secondary | ICD-10-CM | POA: Insufficient documentation

## 2014-04-30 DIAGNOSIS — R259 Unspecified abnormal involuntary movements: Secondary | ICD-10-CM | POA: Insufficient documentation

## 2014-04-30 DIAGNOSIS — Z8781 Personal history of (healed) traumatic fracture: Secondary | ICD-10-CM | POA: Diagnosis not present

## 2014-04-30 DIAGNOSIS — F3289 Other specified depressive episodes: Secondary | ICD-10-CM | POA: Insufficient documentation

## 2014-04-30 DIAGNOSIS — R4182 Altered mental status, unspecified: Secondary | ICD-10-CM

## 2014-04-30 DIAGNOSIS — E785 Hyperlipidemia, unspecified: Secondary | ICD-10-CM | POA: Insufficient documentation

## 2014-04-30 DIAGNOSIS — F411 Generalized anxiety disorder: Secondary | ICD-10-CM | POA: Insufficient documentation

## 2014-04-30 DIAGNOSIS — Z8543 Personal history of malignant neoplasm of ovary: Secondary | ICD-10-CM | POA: Insufficient documentation

## 2014-04-30 DIAGNOSIS — G20A1 Parkinson's disease without dyskinesia, without mention of fluctuations: Secondary | ICD-10-CM | POA: Insufficient documentation

## 2014-04-30 LAB — CBC WITH DIFFERENTIAL/PLATELET
Basophils Absolute: 0 10*3/uL (ref 0.0–0.1)
Basophils Relative: 0 % (ref 0–1)
Eosinophils Absolute: 0.1 10*3/uL (ref 0.0–0.7)
Eosinophils Relative: 2 % (ref 0–5)
HCT: 37.2 % (ref 36.0–46.0)
Hemoglobin: 12.3 g/dL (ref 12.0–15.0)
Lymphocytes Relative: 29 % (ref 12–46)
Lymphs Abs: 1.6 10*3/uL (ref 0.7–4.0)
MCH: 31 pg (ref 26.0–34.0)
MCHC: 33.1 g/dL (ref 30.0–36.0)
MCV: 93.7 fL (ref 78.0–100.0)
Monocytes Absolute: 0.6 10*3/uL (ref 0.1–1.0)
Monocytes Relative: 10 % (ref 3–12)
Neutro Abs: 3.2 10*3/uL (ref 1.7–7.7)
Neutrophils Relative %: 59 % (ref 43–77)
Platelets: 159 10*3/uL (ref 150–400)
RBC: 3.97 MIL/uL (ref 3.87–5.11)
RDW: 13.8 % (ref 11.5–15.5)
WBC: 5.5 10*3/uL (ref 4.0–10.5)

## 2014-04-30 LAB — URINALYSIS, ROUTINE W REFLEX MICROSCOPIC
Bilirubin Urine: NEGATIVE
Glucose, UA: NEGATIVE mg/dL
Hgb urine dipstick: NEGATIVE
Ketones, ur: NEGATIVE mg/dL
Leukocytes, UA: NEGATIVE
Nitrite: NEGATIVE
Protein, ur: NEGATIVE mg/dL
Specific Gravity, Urine: 1.019 (ref 1.005–1.030)
Urobilinogen, UA: 0.2 mg/dL (ref 0.0–1.0)
pH: 5.5 (ref 5.0–8.0)

## 2014-04-30 LAB — BASIC METABOLIC PANEL
Anion gap: 12 (ref 5–15)
BUN: 27 mg/dL — ABNORMAL HIGH (ref 6–23)
CO2: 24 mEq/L (ref 19–32)
Calcium: 9 mg/dL (ref 8.4–10.5)
Chloride: 107 mEq/L (ref 96–112)
Creatinine, Ser: 1.06 mg/dL (ref 0.50–1.10)
GFR calc Af Amer: 53 mL/min — ABNORMAL LOW (ref >90)
GFR calc non Af Amer: 45 mL/min — ABNORMAL LOW (ref >90)
Glucose, Bld: 107 mg/dL — ABNORMAL HIGH (ref 70–99)
Potassium: 4.1 mEq/L (ref 3.7–5.3)
Sodium: 143 mEq/L (ref 137–147)

## 2014-04-30 LAB — I-STAT TROPONIN, ED: Troponin i, poc: 0.01 ng/mL (ref 0.00–0.08)

## 2014-04-30 NOTE — Discharge Instructions (Signed)

## 2014-04-30 NOTE — ED Notes (Signed)
Went to collect labs - pt's family would like it pulled from her line ( at least try to ).  RN aware and will let me know if I need to draw it.

## 2014-04-30 NOTE — ED Notes (Signed)
Pt in from home. Family reports pt has been weak since 9am, progressively worsening, though noticeably worse since 3pm. Recently treated for UTI.

## 2014-05-07 NOTE — ED Provider Notes (Signed)
CSN: [id number]     Arrival date & time 04/30/14  1845 History   First MD Initiated Contact with Patient 04/30/14 1858     Chief Complaint  Patient presents with  . Weakness     (Consider location/radiation/quality/duration/timing/severity/associated sxs/prior Treatment) HPI  80 year old female brought in by son and daughter-in-law her evaluation and change in her mental status. First noticed around 9:00 this morning. Patient seems much more tired than she typically is. Often will not often fall sleep during the day, but stayed specifically said she wanted to take a nap which is unusual for her. She has a past history dementia, but today she is not quite as alert as she normally is. No trauma that family is aware of. No recent medication changes. Appetite has been okay. No vomiting or diarrhea. Patient has not voiced any recent complaints of them otherwise.  Past Medical History  Diagnosis Date  . Dementia     significant  . Renal disease   . Memory loss 12/10/2012  . Depression 12/10/2012  . Anxiety state, unspecified 12/10/2012  . Spells 12/10/2012  . Essential hypertension, benign 12/10/2012  . Other and unspecified hyperlipidemia 12/10/2012  . Loss of weight 12/10/2012  . Carotid artery stenosis   . Ovarian cancer     treated with surgery & chemo  . Ankle fracture, right   . Unstable gait     mild  . Syncope 12/10/2012    Recurrent, sporadic episodes  . Carotid artery disease   . Parkinson disease    Past Surgical History  Procedure Laterality Date  . Vesicovaginal fistula closure w/ tah  2005  . Oophorectomy  2007    for ovarian cancer  . Prolapsed uterine fibroid ligation    . Appendectomy    . Cataract extraction Bilateral   . Implantable loop recorder  11/14    MDT LINQ implanted for recurrent unexplained syncope by Dr Rayann Heman  . Abdominal hysterectomy     Family History  Problem Relation Age of Onset  . Cancer Mother     breast  . Stroke Father    History   Substance Use Topics  . Smoking status: Never Smoker   . Smokeless tobacco: Not on file  . Alcohol Use: No   OB History   Grav Para Term Preterm Abortions TAB SAB Ect Mult Living                 Review of Systems  All systems reviewed and negative, other than as noted in HPI.   Allergies  Iohexol; Ciprofloxacin; Nsaids; Sulfa antibiotics; and Ivp dye  Home Medications   Prior to Admission medications   Medication Sig Start Date End Date Taking? Authorizing Provider  amLODipine (NORVASC) 2.5 MG tablet Take 2.5 mg by mouth daily.   Yes Historical Provider, MD  amoxicillin-clavulanate (AUGMENTIN) 875-125 MG per tablet Take 1 tablet by mouth 2 (two) times daily.   Yes Historical Provider, MD  aspirin EC 81 MG tablet Take 81 mg by mouth daily.   Yes Historical Provider, MD  atorvastatin (LIPITOR) 20 MG tablet Take 20 mg by mouth at bedtime.    Yes Historical Provider, MD  calcium carbonate (OS-CAL) 600 MG TABS tablet Take 600 mg by mouth at bedtime.   Yes Historical Provider, MD  loratadine (CLARITIN) 10 MG tablet Take 10 mg by mouth as needed. 11/30/13  Yes Historical Provider, MD  Memantine HCl ER (NAMENDA XR) 28 MG CP24 Take 28 mg by mouth daily. 04/02/14  Yes Star Age, MD  Multiple Vitamin (MULTIVITAMIN WITH MINERALS) TABS Take 1 tablet by mouth daily.   Yes Historical Provider, MD  nitrofurantoin, macrocrystal-monohydrate, (MACROBID) 100 MG capsule Take 100 mg by mouth at bedtime.   Yes Historical Provider, MD  oxybutynin (DITROPAN-XL) 10 MG 24 hr tablet Take 10 mg by mouth daily.   Yes Historical Provider, MD  polyethylene glycol (MIRALAX / GLYCOLAX) packet Take 17 g by mouth daily as needed for mild constipation. With 8 oz of liquid daily. 12/24/13  Yes Eugenie Filler, MD  sertraline (ZOLOFT) 50 MG tablet Take 50 mg by mouth daily.   Yes Historical Provider, MD   BP 191/70  Pulse 73  Temp(Src) 98.4 F (36.9 C) (Oral)  Resp 18  SpO2 96% Physical Exam  Nursing note and  vitals reviewed. Constitutional: She appears well-developed and well-nourished. No distress.  Sitting up in bed. Awake. No acute distress.  HENT:  Head: Normocephalic and atraumatic.  Eyes: Conjunctivae are normal. Right eye exhibits no discharge. Left eye exhibits no discharge.  Neck: Neck supple.  Cardiovascular: Normal rate, regular rhythm and normal heart sounds.  Exam reveals no gallop and no friction rub.   No murmur heard. Pulmonary/Chest: Effort normal and breath sounds normal. No respiratory distress.  Abdominal: Soft. She exhibits no distension. There is no tenderness.  Musculoskeletal: She exhibits no edema and no tenderness.  Neurological: She is alert.  Pleasantly confused. Oriented to herself. She did initially tell me she is in the hospital when first asked, but later in her conversation she asked me where we were. She follows commands. Cranial nerve strength intact. Strength is 5 out of 5 bilateral upper and lower extremities. Mild resting tremor. Muscle tone seems normal.  Skin: Skin is warm and dry.  Psychiatric: She has a normal mood and affect. Her behavior is normal. Thought content normal.    ED Course  Procedures (including critical care time) Labs Review Labs Reviewed  BASIC METABOLIC PANEL - Abnormal; Notable for the following:    Glucose, Bld 107 (*)    BUN 27 (*)    GFR calc non Af Amer 45 (*)    GFR calc Af Amer 53 (*)    All other components within normal limits  CBC WITH DIFFERENTIAL  URINALYSIS, ROUTINE W REFLEX MICROSCOPIC  I-STAT TROPOININ, ED    Imaging Review No results found.   EKG Interpretation   Date/Time:  Wednesday April 30 2014 18:49:06 EDT Ventricular Rate:  76 PR Interval:  198 QRS Duration: 120 QT Interval:  438 QTC Calculation: 492 R Axis:   53 Text Interpretation:  Sinus rhythm Consider left atrial enlargement IVCD,  consider atypical RBBB Abnormal inferior Q waves ED PHYSICIAN  INTERPRETATION AVAILABLE IN [facility]  Confirmed by TEST, Record  ([id number]) on 05/02/2014 8:19:54 AM      MDM   Final diagnoses:  Altered mental status, unspecified altered mental status type  Chest pain, unspecified chest pain type    80 year old female with a change in her baseline mental status. Etiology is not completely clear. Essentially she just seems to be although generally weak and not as alert as typically is. This has since resolved since being in the emergency room. She is more or less back to her baseline. I do not have an obvious explanation for her other symptoms, but nothing has me significantly concerned for emergent process. She is afebrile. Hemodynamically stable aside from hypertension. I do not feel that her symptoms are triggered all  to this though. Her workup has been pretty unremarkable. I feel she is stable for discharge at this time. Discussed extensively with son and daughter-in-law at the bedside. They are comfortable with this. Return precautions were discussed.    Virgel Manifold, MD 05/07/14 2202

## 2014-05-28 ENCOUNTER — Emergency Department (HOSPITAL_COMMUNITY): Payer: Medicare Other

## 2014-05-28 ENCOUNTER — Emergency Department (HOSPITAL_COMMUNITY)
Admission: EM | Admit: 2014-05-28 | Discharge: 2014-05-29 | Disposition: A | Discharge status: Home / Self Care | Payer: Medicare Other | Attending: Emergency Medicine | Admitting: Emergency Medicine

## 2014-05-28 ENCOUNTER — Encounter (HOSPITAL_COMMUNITY): Payer: Self-pay | Admitting: Emergency Medicine

## 2014-05-28 DIAGNOSIS — Z79899 Other long term (current) drug therapy: Secondary | ICD-10-CM | POA: Diagnosis not present

## 2014-05-28 DIAGNOSIS — Z87448 Personal history of other diseases of urinary system: Secondary | ICD-10-CM | POA: Insufficient documentation

## 2014-05-28 DIAGNOSIS — R52 Pain, unspecified: Secondary | ICD-10-CM | POA: Insufficient documentation

## 2014-05-28 DIAGNOSIS — F3289 Other specified depressive episodes: Secondary | ICD-10-CM | POA: Diagnosis not present

## 2014-05-28 DIAGNOSIS — Z7982 Long term (current) use of aspirin: Secondary | ICD-10-CM | POA: Insufficient documentation

## 2014-05-28 DIAGNOSIS — G20 Parkinson's disease: Secondary | ICD-10-CM | POA: Diagnosis not present

## 2014-05-28 DIAGNOSIS — E785 Hyperlipidemia, unspecified: Secondary | ICD-10-CM | POA: Insufficient documentation

## 2014-05-28 DIAGNOSIS — Z792 Long term (current) use of antibiotics: Secondary | ICD-10-CM | POA: Insufficient documentation

## 2014-05-28 DIAGNOSIS — Z8781 Personal history of (healed) traumatic fracture: Secondary | ICD-10-CM | POA: Insufficient documentation

## 2014-05-28 DIAGNOSIS — I779 Disorder of arteries and arterioles, unspecified: Secondary | ICD-10-CM | POA: Insufficient documentation

## 2014-05-28 DIAGNOSIS — F0390 Unspecified dementia without behavioral disturbance: Secondary | ICD-10-CM | POA: Diagnosis not present

## 2014-05-28 DIAGNOSIS — Z9089 Acquired absence of other organs: Secondary | ICD-10-CM | POA: Insufficient documentation

## 2014-05-28 DIAGNOSIS — F329 Major depressive disorder, single episode, unspecified: Secondary | ICD-10-CM | POA: Diagnosis not present

## 2014-05-28 DIAGNOSIS — Z8543 Personal history of malignant neoplasm of ovary: Secondary | ICD-10-CM | POA: Insufficient documentation

## 2014-05-28 DIAGNOSIS — G20A1 Parkinson's disease without dyskinesia, without mention of fluctuations: Secondary | ICD-10-CM | POA: Insufficient documentation

## 2014-05-28 DIAGNOSIS — Z90710 Acquired absence of both cervix and uterus: Secondary | ICD-10-CM | POA: Insufficient documentation

## 2014-05-28 DIAGNOSIS — I10 Essential (primary) hypertension: Secondary | ICD-10-CM | POA: Diagnosis not present

## 2014-05-28 DIAGNOSIS — R109 Unspecified abdominal pain: Secondary | ICD-10-CM | POA: Diagnosis not present

## 2014-05-28 LAB — BASIC METABOLIC PANEL
Anion gap: 14 (ref 5–15)
BUN: 30 mg/dL — AB (ref 6–23)
CALCIUM: 9 mg/dL (ref 8.4–10.5)
CO2: 22 mEq/L (ref 19–32)
Chloride: 102 mEq/L (ref 96–112)
Creatinine, Ser: 1.15 mg/dL — ABNORMAL HIGH (ref 0.50–1.10)
GFR calc non Af Amer: 41 mL/min — ABNORMAL LOW (ref >90)
GFR, EST AFRICAN AMERICAN: 48 mL/min — AB (ref >90)
GLUCOSE: 102 mg/dL — AB (ref 70–99)
Potassium: 4.5 mEq/L (ref 3.7–5.3)
SODIUM: 138 meq/L (ref 137–147)

## 2014-05-28 LAB — I-STAT TROPONIN, ED: TROPONIN I, POC: 0.02 ng/mL (ref 0.00–0.08)

## 2014-05-28 LAB — CBC
HCT: 41 % (ref 36.0–46.0)
HEMOGLOBIN: 13.7 g/dL (ref 12.0–15.0)
MCH: 30.6 pg (ref 26.0–34.0)
MCHC: 33.4 g/dL (ref 30.0–36.0)
MCV: 91.7 fL (ref 78.0–100.0)
Platelets: 213 10*3/uL (ref 150–400)
RBC: 4.47 MIL/uL (ref 3.87–5.11)
RDW: 14 % (ref 11.5–15.5)
WBC: 6.2 10*3/uL (ref 4.0–10.5)

## 2014-05-28 LAB — HEPATIC FUNCTION PANEL
ALBUMIN: 3.3 g/dL — AB (ref 3.5–5.2)
ALT: 21 U/L (ref 0–35)
AST: 24 U/L (ref 0–37)
Alkaline Phosphatase: 90 U/L (ref 39–117)
Bilirubin, Direct: <0.2 mg/dL (ref 0.0–0.3)
TOTAL PROTEIN: 6.7 g/dL (ref 6.0–8.3)

## 2014-05-28 LAB — LIPASE, BLOOD: LIPASE: 58 U/L (ref 11–59)

## 2014-05-28 MED ORDER — AMLODIPINE BESYLATE 5 MG PO TABS
5.0000 mg | ORAL_TABLET | Freq: Once | ORAL | Status: AC
  Administered 2014-05-29: 5 mg via ORAL
  Filled 2014-05-28: qty 1

## 2014-05-28 MED ORDER — CLONIDINE HCL 0.1 MG PO TABS
0.1000 mg | ORAL_TABLET | Freq: Once | ORAL | Status: AC
  Administered 2014-05-29: 0.1 mg via ORAL
  Filled 2014-05-28: qty 1

## 2014-05-28 NOTE — ED Provider Notes (Signed)
CSN: [id number]     Arrival date & time 05/28/14  2126 History   First MD Initiated Contact with Patient 05/28/14 2141     Chief Complaint  Patient presents with  . Abdominal Pain  . Hypertension     (Consider location/radiation/quality/duration/timing/severity/associated sxs/prior Treatment) HPI This patient's history is significantly limited by her dementia. She is alert and interactive and generally well in appearance however her capacity for a reliable history seem significantly impaired. She is cheerful and describing her pains as things that she might have had earlier in the day. The initial EMS report was for abdominal pain however the patient states she doesn't think that was the problem. She says that her chest hurt but doesn't now and she feels fine. She is denying any positives on review of systems. No family members have come to provide any additional history.  Past Medical History  Diagnosis Date  . Dementia     significant  . Renal disease   . Memory loss 12/10/2012  . Depression 12/10/2012  . Anxiety state, unspecified 12/10/2012  . Spells 12/10/2012  . Essential hypertension, benign 12/10/2012  . Other and unspecified hyperlipidemia 12/10/2012  . Loss of weight 12/10/2012  . Carotid artery stenosis   . Ovarian cancer     treated with surgery & chemo  . Ankle fracture, right   . Unstable gait     mild  . Syncope 12/10/2012    Recurrent, sporadic episodes  . Carotid artery disease   . Parkinson disease    Past Surgical History  Procedure Laterality Date  . Vesicovaginal fistula closure w/ tah  2005  . Oophorectomy  2007    for ovarian cancer  . Prolapsed uterine fibroid ligation    . Appendectomy    . Cataract extraction Bilateral   . Implantable loop recorder  11/14    MDT LINQ implanted for recurrent unexplained syncope by Dr Rayann Heman  . Abdominal hysterectomy     Family History  Problem Relation Age of Onset  . Cancer Mother     breast  . Stroke Father     History  Substance Use Topics  . Smoking status: Never Smoker   . Smokeless tobacco: Not on file  . Alcohol Use: No   OB History   Grav Para Term Preterm Abortions TAB SAB Ect Mult Living                 Review of Systems  10 systems reviewed and negative with caveat the patient is likely an unreliable historian.  Allergies  Iohexol; Ciprofloxacin; Nsaids; Sulfa antibiotics; and Ivp dye  Home Medications   Prior to Admission medications   Medication Sig Start Date End Date Taking? Authorizing Provider  amLODipine (NORVASC) 2.5 MG tablet Take 2.5 mg by mouth daily.    Historical Provider, MD  amoxicillin-clavulanate (AUGMENTIN) 875-125 MG per tablet Take 1 tablet by mouth 2 (two) times daily.    Historical Provider, MD  aspirin EC 81 MG tablet Take 81 mg by mouth daily.    Historical Provider, MD  atorvastatin (LIPITOR) 20 MG tablet Take 20 mg by mouth at bedtime.     Historical Provider, MD  calcium carbonate (OS-CAL) 600 MG TABS tablet Take 600 mg by mouth at bedtime.    Historical Provider, MD  loratadine (CLARITIN) 10 MG tablet Take 10 mg by mouth as needed. 11/30/13   Historical Provider, MD  Memantine HCl ER (NAMENDA XR) 28 MG CP24 Take 28 mg by mouth daily.  04/02/14   Star Age, MD  Multiple Vitamin (MULTIVITAMIN WITH MINERALS) TABS Take 1 tablet by mouth daily.    Historical Provider, MD  nitrofurantoin, macrocrystal-monohydrate, (MACROBID) 100 MG capsule Take 100 mg by mouth at bedtime.    Historical Provider, MD  oxybutynin (DITROPAN-XL) 10 MG 24 hr tablet Take 10 mg by mouth daily.    Historical Provider, MD  polyethylene glycol (MIRALAX / GLYCOLAX) packet Take 17 g by mouth daily as needed for mild constipation. With 8 oz of liquid daily. 12/24/13   Eugenie Filler, MD  sertraline (ZOLOFT) 50 MG tablet Take 50 mg by mouth daily.    Historical Provider, MD   BP 159/70  Pulse 69  Temp(Src) 98.2 F (36.8 C) (Oral)  Resp 14  SpO2 96% Physical Exam   Constitutional:  The patient is well-nourished and well-developed. She appears relatively well groomed. She has an alert appearance to her that her cognitive function seems impaired consistent with dementia.  HENT:  Head: Normocephalic and atraumatic.  Mouth/Throat: Oropharynx is clear and moist.  Eyes: EOM are normal. Pupils are equal, round, and reactive to light.  Neck: Neck supple.  Cardiovascular: Normal rate, regular rhythm, normal heart sounds and intact distal pulses.   Pulmonary/Chest: Effort normal and breath sounds normal.  Abdominal: Soft. Bowel sounds are normal. She exhibits no distension. There is no tenderness.  Musculoskeletal: Normal range of motion. She exhibits no edema.  Neurological: She is alert. She has normal strength. Coordination normal. GCS eye subscore is 4. GCS verbal subscore is 5. GCS motor subscore is 6.  Skin: Skin is warm, dry and intact.     ED Course  Procedures (including critical care time) Labs Review Labs Reviewed  BASIC METABOLIC PANEL - Abnormal; Notable for the following:    Glucose, Bld 102 (*)    BUN 30 (*)    Creatinine, Ser 1.15 (*)    GFR calc non Af Amer 41 (*)    GFR calc Af Amer 48 (*)    All other components within normal limits  HEPATIC FUNCTION PANEL - Abnormal; Notable for the following:    Albumin 3.3 (*)    Total Bilirubin <0.2 (*)    All other components within normal limits  CBC  LIPASE, BLOOD  URINALYSIS, ROUTINE W REFLEX MICROSCOPIC  I-STAT TROPOININ, ED    Imaging Review Dg Chest 2 View  05/29/2014   CLINICAL DATA:  Abdominal and chest pain.  Hypertension.  EXAM: CHEST  2 VIEW  COMPARISON:  Chest radiograph performed earlier today at 5:15 p.m.  FINDINGS: The lungs are mildly hypoexpanded. Mild left basilar opacity may reflect atelectasis or scarring, stable from prior studies. A metallic device is noted at the anterior left chest wall.  The heart is normal in size; the mediastinal contour is within normal limits.  No acute osseous abnormalities are seen.  IMPRESSION: Lungs mildly hypoexpanded. Mild left basilar opacity is stable from prior studies and may reflect atelectasis or scarring.   Electronically Signed   By: Garald Balding M.D.   On: 05/29/2014 00:11     EKG Interpretation   Date/Time:  Wednesday May 28 2014 21:37:23 EDT Ventricular Rate:  71 PR Interval:  182 QRS Duration: 127 QT Interval:  471 QTC Calculation: 512 R Axis:   76 Text Interpretation:  Age not entered, assumed to be  80 years old for  purpose of ECG interpretation Sinus rhythm Right bundle branch block no  changes no STEMI Confirmed by Johnney Killian, MD,  Jeannie Done [phone]) on 05/28/2014  9:45:26 PM      MDM   Final diagnoses:  Abdominal pain, acute  Essential hypertension  Dementia, without behavioral disturbance   the patient presented with a chief complaint of either or abdominal pain or chest pain, however with evaluation questioning at this point and she is denying any pain. She does have a well appearance. Diagnostic workup has been negative. At this point time I have reviewed the history presents with the patient's daughter-in-law over the phone, we discussed her lab results and her general condition, and at this point time she'll be discharged home. I have reassessed the patient and she is showing no signs of pain or discomfort. She is pleasant and confused.    Charlesetta Shanks, MD 05/29/14 [phone]

## 2014-05-28 NOTE — ED Notes (Signed)
Per EMS: Pt from home with c/o central abdominal pain. Denies N/V. PT now states "I never had abdominal pain, it was actually in my chest." Pt has hx of Alzheimers, AO at baseline. Pt BP 230/109. NSR 70's. Denies pain at this time.

## 2014-05-29 ENCOUNTER — Telehealth: Payer: Self-pay | Admitting: *Deleted

## 2014-05-29 ENCOUNTER — Telehealth: Payer: Self-pay | Admitting: Neurology

## 2014-05-29 DIAGNOSIS — I10 Essential (primary) hypertension: Secondary | ICD-10-CM | POA: Diagnosis not present

## 2014-05-29 LAB — URINALYSIS, ROUTINE W REFLEX MICROSCOPIC
Bilirubin Urine: NEGATIVE
GLUCOSE, UA: NEGATIVE mg/dL
Hgb urine dipstick: NEGATIVE
KETONES UR: NEGATIVE mg/dL
LEUKOCYTES UA: NEGATIVE
NITRITE: NEGATIVE
PH: 5.5 (ref 5.0–8.0)
Protein, ur: NEGATIVE mg/dL
SPECIFIC GRAVITY, URINE: 1.014 (ref 1.005–1.030)
Urobilinogen, UA: 0.2 mg/dL (ref 0.0–1.0)

## 2014-05-29 MED ORDER — CEPHALEXIN 500 MG PO CAPS: 1000.0000 mg | ORAL_CAPSULE | Freq: Two times a day (BID) | ORAL | Status: DC

## 2014-05-29 MED ORDER — FAMOTIDINE 20 MG PO TABS: 20.0000 mg | ORAL_TABLET | Freq: Two times a day (BID) | ORAL | Status: DC

## 2014-05-29 MED ORDER — FAMOTIDINE 20 MG PO TABS
20.0000 mg | ORAL_TABLET | Freq: Once | ORAL | Status: AC
  Administered 2014-05-29: 20 mg via ORAL
  Filled 2014-05-29: qty 1

## 2014-05-29 NOTE — Discharge Instructions (Signed)

## 2014-05-29 NOTE — Telephone Encounter (Signed)
Call daughter the form is at the front desk. 05/29/14

## 2014-05-29 NOTE — Telephone Encounter (Signed)
Form([employer]-continuity of care) was signed by Dr Rexene Alberts and given to medical records

## 2014-06-11 ENCOUNTER — Encounter (HOSPITAL_COMMUNITY): Payer: Self-pay | Admitting: Emergency Medicine

## 2014-06-11 ENCOUNTER — Observation Stay (HOSPITAL_BASED_OUTPATIENT_CLINIC_OR_DEPARTMENT_OTHER)
Admission: EM | Admit: 2014-06-11 | Discharge: 2014-06-12 | Disposition: A | Discharge status: Home / Self Care | Payer: Medicare Other | Source: Home / Self Care | Attending: Emergency Medicine | Admitting: Emergency Medicine

## 2014-06-11 ENCOUNTER — Emergency Department (HOSPITAL_COMMUNITY): Payer: Medicare Other

## 2014-06-11 DIAGNOSIS — I10 Essential (primary) hypertension: Secondary | ICD-10-CM | POA: Insufficient documentation

## 2014-06-11 DIAGNOSIS — Z8781 Personal history of (healed) traumatic fracture: Secondary | ICD-10-CM | POA: Insufficient documentation

## 2014-06-11 DIAGNOSIS — Z8543 Personal history of malignant neoplasm of ovary: Secondary | ICD-10-CM | POA: Insufficient documentation

## 2014-06-11 DIAGNOSIS — R079 Chest pain, unspecified: Secondary | ICD-10-CM | POA: Diagnosis present

## 2014-06-11 DIAGNOSIS — I779 Disorder of arteries and arterioles, unspecified: Secondary | ICD-10-CM | POA: Insufficient documentation

## 2014-06-11 DIAGNOSIS — F329 Major depressive disorder, single episode, unspecified: Secondary | ICD-10-CM | POA: Insufficient documentation

## 2014-06-11 DIAGNOSIS — F411 Generalized anxiety disorder: Secondary | ICD-10-CM

## 2014-06-11 DIAGNOSIS — K219 Gastro-esophageal reflux disease without esophagitis: Secondary | ICD-10-CM | POA: Diagnosis present

## 2014-06-11 DIAGNOSIS — Z792 Long term (current) use of antibiotics: Secondary | ICD-10-CM | POA: Insufficient documentation

## 2014-06-11 DIAGNOSIS — F0390 Unspecified dementia without behavioral disturbance: Secondary | ICD-10-CM | POA: Diagnosis present

## 2014-06-11 DIAGNOSIS — I6529 Occlusion and stenosis of unspecified carotid artery: Secondary | ICD-10-CM

## 2014-06-11 DIAGNOSIS — K5669 Other intestinal obstruction: Secondary | ICD-10-CM | POA: Diagnosis not present

## 2014-06-11 DIAGNOSIS — Z79899 Other long term (current) drug therapy: Secondary | ICD-10-CM

## 2014-06-11 DIAGNOSIS — G20 Parkinson's disease: Secondary | ICD-10-CM

## 2014-06-11 DIAGNOSIS — Z7982 Long term (current) use of aspirin: Secondary | ICD-10-CM | POA: Insufficient documentation

## 2014-06-11 DIAGNOSIS — E119 Type 2 diabetes mellitus without complications: Secondary | ICD-10-CM

## 2014-06-11 DIAGNOSIS — R1110 Vomiting, unspecified: Secondary | ICD-10-CM | POA: Insufficient documentation

## 2014-06-11 DIAGNOSIS — R110 Nausea: Secondary | ICD-10-CM | POA: Diagnosis present

## 2014-06-11 DIAGNOSIS — I471 Supraventricular tachycardia, unspecified: Secondary | ICD-10-CM | POA: Diagnosis present

## 2014-06-11 DIAGNOSIS — K565 Intestinal adhesions [bands] with obstruction (postprocedural) (postinfection): Secondary | ICD-10-CM | POA: Diagnosis not present

## 2014-06-11 DIAGNOSIS — Z87898 Personal history of other specified conditions: Secondary | ICD-10-CM

## 2014-06-11 DIAGNOSIS — R531 Weakness: Secondary | ICD-10-CM

## 2014-06-11 DIAGNOSIS — R1010 Upper abdominal pain, unspecified: Secondary | ICD-10-CM | POA: Insufficient documentation

## 2014-06-11 DIAGNOSIS — E785 Hyperlipidemia, unspecified: Secondary | ICD-10-CM

## 2014-06-11 DIAGNOSIS — N289 Disorder of kidney and ureter, unspecified: Secondary | ICD-10-CM | POA: Insufficient documentation

## 2014-06-11 HISTORY — DX: Type 2 diabetes mellitus without complications: E11.9

## 2014-06-11 LAB — BASIC METABOLIC PANEL
Anion gap: 12 (ref 5–15)
BUN: 34 mg/dL — ABNORMAL HIGH (ref 6–23)
CO2: 26 meq/L (ref 19–32)
Calcium: 9.7 mg/dL (ref 8.4–10.5)
Chloride: 101 mEq/L (ref 96–112)
Creatinine, Ser: 1.36 mg/dL — ABNORMAL HIGH (ref 0.50–1.10)
GFR calc Af Amer: 39 mL/min — ABNORMAL LOW (ref >90)
GFR calc non Af Amer: 34 mL/min — ABNORMAL LOW (ref >90)
GLUCOSE: 119 mg/dL — AB (ref 70–99)
POTASSIUM: 4.7 meq/L (ref 3.7–5.3)
Sodium: 139 mEq/L (ref 137–147)

## 2014-06-11 LAB — CBC
HEMATOCRIT: 40.4 % (ref 36.0–46.0)
HEMOGLOBIN: 13.3 g/dL (ref 12.0–15.0)
MCH: 31.3 pg (ref 26.0–34.0)
MCHC: 32.9 g/dL (ref 30.0–36.0)
MCV: 95.1 fL (ref 78.0–100.0)
Platelets: 184 10*3/uL (ref 150–400)
RBC: 4.25 MIL/uL (ref 3.87–5.11)
RDW: 14.1 % (ref 11.5–15.5)
WBC: 8.5 10*3/uL (ref 4.0–10.5)

## 2014-06-11 LAB — TROPONIN I: Troponin I: <0.3 ng/mL (ref <0.30)

## 2014-06-11 LAB — I-STAT TROPONIN, ED: Troponin i, poc: 0.02 ng/mL (ref 0.00–0.08)

## 2014-06-11 LAB — PRO B NATRIURETIC PEPTIDE: Pro B Natriuretic peptide (BNP): 390.1 pg/mL (ref 0–450)

## 2014-06-11 MED ORDER — OXYBUTYNIN CHLORIDE ER 10 MG PO TB24
10.0000 mg | ORAL_TABLET | Freq: Every day | ORAL | Status: DC
  Administered 2014-06-11 – 2014-06-12 (×2): 10 mg via ORAL
  Filled 2014-06-11 (×2): qty 1

## 2014-06-11 MED ORDER — POLYETHYLENE GLYCOL 3350 17 G PO PACK
17.0000 g | PACK | Freq: Every day | ORAL | Status: DC | PRN
Start: 2014-06-11 — End: 2014-06-12
  Filled 2014-06-11: qty 1

## 2014-06-11 MED ORDER — FAMOTIDINE 20 MG PO TABS
20.0000 mg | ORAL_TABLET | Freq: Two times a day (BID) | ORAL | Status: DC
Start: 2014-06-11 — End: 2014-06-12
  Administered 2014-06-11 – 2014-06-12 (×2): 20 mg via ORAL
  Filled 2014-06-11 (×3): qty 1

## 2014-06-11 MED ORDER — ACETAMINOPHEN 325 MG PO TABS: 650.0000 mg | ORAL_TABLET | ORAL | Status: DC | PRN

## 2014-06-11 MED ORDER — CALCIUM CARBONATE 1250 (500 CA) MG PO TABS
1.0000 | ORAL_TABLET | Freq: Every day | ORAL | Status: DC
  Administered 2014-06-11: 500 mg via ORAL
  Filled 2014-06-11 (×2): qty 1

## 2014-06-11 MED ORDER — CALCIUM CARBONATE 600 MG PO TABS: 600.0000 mg | ORAL_TABLET | Freq: Every day | ORAL | Status: DC

## 2014-06-11 MED ORDER — ADULT MULTIVITAMIN W/MINERALS CH
1.0000 | ORAL_TABLET | Freq: Every day | ORAL | Status: DC
  Administered 2014-06-11 – 2014-06-12 (×2): 1 via ORAL
  Filled 2014-06-11 (×2): qty 1

## 2014-06-11 MED ORDER — SERTRALINE HCL 50 MG PO TABS
50.0000 mg | ORAL_TABLET | Freq: Every day | ORAL | Status: DC
  Administered 2014-06-11 – 2014-06-12 (×2): 50 mg via ORAL
  Filled 2014-06-11 (×2): qty 1

## 2014-06-11 MED ORDER — HEPARIN SODIUM (PORCINE) 5000 UNIT/ML IJ SOLN
5000.0000 [IU] | Freq: Three times a day (TID) | INTRAMUSCULAR | Status: DC
  Administered 2014-06-11 – 2014-06-12 (×3): 5000 [IU] via SUBCUTANEOUS
  Filled 2014-06-11 (×5): qty 1

## 2014-06-11 MED ORDER — LORATADINE 10 MG PO TABS
10.0000 mg | ORAL_TABLET | Freq: Every day | ORAL | Status: DC | PRN
  Filled 2014-06-11: qty 1

## 2014-06-11 MED ORDER — ATORVASTATIN CALCIUM 20 MG PO TABS
20.0000 mg | ORAL_TABLET | Freq: Every day | ORAL | Status: DC
  Administered 2014-06-11: 20 mg via ORAL
  Filled 2014-06-11 (×2): qty 1

## 2014-06-11 MED ORDER — ASPIRIN EC 81 MG PO TBEC
81.0000 mg | DELAYED_RELEASE_TABLET | Freq: Every day | ORAL | Status: DC
  Administered 2014-06-11 – 2014-06-12 (×2): 81 mg via ORAL
  Filled 2014-06-11 (×2): qty 1

## 2014-06-11 MED ORDER — MEMANTINE HCL ER 28 MG PO CP24
28.0000 mg | ORAL_CAPSULE | Freq: Every day | ORAL | Status: DC
  Administered 2014-06-11 – 2014-06-12 (×2): 28 mg via ORAL
  Filled 2014-06-11 (×2): qty 28

## 2014-06-11 MED ORDER — ONDANSETRON HCL 4 MG/2ML IJ SOLN: 4.0000 mg | Freq: Four times a day (QID) | INTRAMUSCULAR | Status: DC | PRN

## 2014-06-11 MED ORDER — AMLODIPINE BESYLATE 2.5 MG PO TABS
2.5000 mg | ORAL_TABLET | Freq: Every day | ORAL | Status: DC
  Administered 2014-06-11 – 2014-06-12 (×2): 2.5 mg via ORAL
  Filled 2014-06-11 (×2): qty 1

## 2014-06-11 NOTE — H&P (Signed)
Date: 06/11/2014               Patient Name:  Alicia [name] MRN: [id number]  DOB: 1923/05/02 Age / Sex: 80 y.o., female   PCP: Shellia Carwin, PA-C         Medical Service: Internal Medicine Teaching Service         Attending Physician: Dr. Annia Belt, MD    First Contact: Dr. Marvel Plan Pager: [phone]  Second Contact: Dr. Heber  Pager: [phone]       After Hours (After 5p/  First Contact Pager: [phone]  weekends / holidays): Second Contact Pager: [phone]   Chief Complaint: chest pain and weakness  History of Present Illness: [name] is a 80 yo female with PMHx of CAD, HTN, HLD, Parkinsonian Dementia, Carotid Artery Stenosis (1-39% on the left and 40-59% on the right), Anxiety, Depression, and Grade 1 Diastolic CHFwho presented to the ED for complaint of chest pressure. HPI is limited secondary to dementia, most of HPI is taking from ED notes from caretaker. Today, patient's home health aide was helping the patient shower when she had a sudden onset of generalized weakness. Patient also had several episodes of dry heaving and clutching her chest, but did not vomit. Patient was cool, diaphoretic and clammy when EMS arrived and complained of pressure in the epigastric region and chest pressure.  Upon talking with the patient, she denies any chest pain, but states she may be a little short of breath and admits to weakness. She does not remember the episode from earlier. She denies any headache, dizziness, chest tightness, palpitations, nausea, vomiting, diarrhea or constipation.  In the ED, patient's EKG showed sinus rhythm with RBBB, unchanged from priors, POC troponin was normal and CXR showed a left base opacity likely from scarring and no acute disease.  Meds: Current Facility-Administered Medications  Medication Dose Route Frequency Provider Last Rate Last Dose  . acetaminophen (TYLENOL) tablet 650 mg  650 mg Oral Q4H PRN Lucious Groves, DO      . amLODipine  (NORVASC) tablet 2.5 mg  2.5 mg Oral Daily Lucious Groves, DO   2.5 mg at 06/11/14 1753  . aspirin EC tablet 81 mg  81 mg Oral Daily Lucious Groves, DO   81 mg at 06/11/14 1753  . atorvastatin (LIPITOR) tablet 20 mg  20 mg Oral QHS Lucious Groves, DO      . calcium carbonate (OS-CAL - dosed in mg of elemental calcium) tablet 500 mg of elemental calcium  1 tablet Oral QHS Annia Belt, MD      . famotidine (PEPCID) tablet 20 mg  20 mg Oral BID Lucious Groves, DO      . heparin injection 5,000 Units  5,000 Units Subcutaneous 3 times per day Lucious Groves, DO      . loratadine (CLARITIN) tablet 10 mg  10 mg Oral Daily PRN Lucious Groves, DO      . Memantine HCl ER CP24 28 mg  28 mg Oral Daily Lucious Groves, DO      . multivitamin with minerals tablet 1 tablet  1 tablet Oral Daily Lucious Groves, DO   1 tablet at 06/11/14 1753  . ondansetron (ZOFRAN) injection 4 mg  4 mg Intravenous Q6H PRN Lucious Groves, DO      . oxybutynin (DITROPAN-XL) 24 hr tablet 10 mg  10 mg Oral Daily Lucious Groves, DO   10 mg at  06/11/14 1753  . polyethylene glycol (MIRALAX / GLYCOLAX) packet 17 g  17 g Oral Daily PRN Lucious Groves, DO      . sertraline (ZOLOFT) tablet 50 mg  50 mg Oral Daily Lucious Groves, DO   50 mg at 06/11/14 1751    Allergies: Allergies as of 06/11/2014 - Review Complete 06/11/2014  Allergen Reaction Noted  . Iohexol Rash 03/12/2013  . Ciprofloxacin Other (See Comments) 07/15/2013  . Nsaids  07/15/2013  . Sulfa antibiotics Hives and Itching 05/20/2012  . Ivp dye [iodinated diagnostic agents] Rash 07/15/2013   Past Medical History  Diagnosis Date  . Dementia     significant  . Renal disease   . Memory loss 12/10/2012  . Depression 12/10/2012  . Anxiety state, unspecified 12/10/2012  . Spells 12/10/2012  . Essential hypertension, benign 12/10/2012  . Other and unspecified hyperlipidemia 12/10/2012  . Loss of weight 12/10/2012  . Carotid artery stenosis   . Ovarian cancer      treated with surgery & chemo  . Ankle fracture, right   . Unstable gait     mild  . Syncope 12/10/2012    Recurrent, sporadic episodes  . Carotid artery disease   . Parkinson disease    Past Surgical History  Procedure Laterality Date  . Vesicovaginal fistula closure w/ tah  2005  . Oophorectomy  2007    for ovarian cancer  . Prolapsed uterine fibroid ligation    . Appendectomy    . Cataract extraction Bilateral   . Implantable loop recorder  11/14    MDT LINQ implanted for recurrent unexplained syncope by Dr Rayann Heman  . Abdominal hysterectomy     Family History  Problem Relation Age of Onset  . Cancer Mother     breast  . Stroke Father    History   Social History  . Marital Status: Married    Spouse Name: N/A    Number of Children: N/A  . Years of Education: N/A   Occupational History  . Not on file.   Social History Main Topics  . Smoking status: Never Smoker   . Smokeless tobacco: Not on file  . Alcohol Use: No  . Drug Use: No  . Sexual Activity: No   Other Topics Concern  . Not on file   Social History Narrative   Lives with son and daughter in law    Review of Systems: General: Denies fever, chills, fatigue, change in appetite and diaphoresis.  Respiratory: Admits to some SOB. Denies cough, DOE, chest tightness, and wheezing.   Cardiovascular: Denies chest pain and palpitations.  Gastrointestinal: Denies nausea, vomiting, abdominal pain, diarrhea, constipation, blood in stool and abdominal distention.  Genitourinary: Denies dysuria, urgency, frequency, hematuria, suprapubic pain and flank pain. Endocrine: Denies hot or cold intolerance, polyuria, and polydipsia. Musculoskeletal: Denies myalgias, back pain, joint swelling, arthralgias and gait problem.  Skin: Denies pallor, rash and wounds.  Neurological: Admits to weakness. Denies dizziness, headaches, lightheadedness, numbness,seizures, and syncope, Psychiatric/Behavioral: Denies mood changes,  confusion, nervousness, sleep disturbance and agitation.  Physical Exam: Filed Vitals:   06/11/14 1345 06/11/14 1357 06/11/14 1430 06/11/14 1445  BP: 167/60 167/60 160/72 165/71  Pulse: 67 69 76 72  Temp:      TempSrc:      Resp: 18 23 19 14   SpO2: 97% 97% 95% 97%   General: Vital signs reviewed.  Patient is well-developed and well-nourished, in no acute distress and cooperative with exam.  Cardiovascular: RRR, S1  normal, S2 normal, no murmurs, gallops, or rubs. Pulmonary/Chest: Clear to auscultation bilaterally, no wheezes, rales, or rhonchi. Abdominal: Firm with some voluntary guarding, but non-tender, non-distended, BS + Musculoskeletal: No joint deformities, erythema, or stiffness, ROM full and nontender. Extremities: No lower extremity edema bilaterally,  pulses symmetric and intact bilaterally. No cyanosis or clubbing. Neurological: AA&O x 1, only to self. No obvious focal deficits. Skin: Warm, dry and intact. No rashes or erythema. Psychiatric: Normal mood and affect. speech and behavior is normal. Cognition and memory are abnormal.   Lab results: Basic Metabolic Panel:  Recent Labs  06/11/14 1142  NA 139  K 4.7  CL 101  CO2 26  GLUCOSE 119*  BUN 34*  CREATININE 1.36*  CALCIUM 9.7   CBC:  Recent Labs  06/11/14 1142  WBC 8.5  HGB 13.3  HCT 40.4  MCV 95.1  PLT 184   BNP:  Recent Labs  06/11/14 1142  PROBNP 390.1    Imaging results:  Dg Chest Port 1 View  06/11/2014   CLINICAL DATA:  Shortness of breath.  Nausea.  Hypertension.  EXAM: PORTABLE CHEST - 1 VIEW  COMPARISON:  05/28/2014  FINDINGS: Midline trachea. Normal heart size. Moderate right hemidiaphragm elevation. No pleural effusion or pneumothorax. Left base opacity which is similar to on the prior exam.  IMPRESSION: Chronic left base opacity, favored to represent an area of scarring.  Otherwise, no acute finding.   Electronically Signed   By: Abigail Miyamoto M.D.   On: 06/11/2014 12:13    Other  results: EKG: normal EKG, normal sinus rhythm, unchanged from previous tracings, RBBB.  Assessment & Plan by Problem: Active Problems:   Essential hypertension, benign   Dementia   Chest pain  Chest Pain: Patient presented after an episode of chest pressure with dry heaving, weakness and diaphoresis. Currently, patient denies any complaints but admits to some weakness and shortness of breath. Patient does have a history of ?CAD. Her poc troponin is negative, EKG shows no signs of active ischemia, and CXR shows no acute disease. Unclear etiology of nausea, weakness and diaphoresis. We will rule out ACS with troponins and repeat EKG in the morning. Doubt infection-no leukocytosis, afebrile, no more complaints of nausea or vomiting, CXR negative for disease. Symptoms could also be due to acute onset of nausea that has resolved. -ASA 81 mg daily -Atorvastatin 20 mg daily -Famotidine 20 mg BID -Zofran 4 mg IV Q6H prn -CMET tomorrow am -Cardiac diet -Repeat EKG in am -Telemetry monitoring -Trend troponins   HTN: Hypertensive in the 702O-378H systolic. On amlodipine 10 mg daily at home. -Continue home amlodipine 10 mg daily  HLD: Patient is on atorvastatin 20 mg daily at home.  -Continue atorvastatin.  Parkinson's Disease with Dementia: Patient is oriented to self only. She is on memantine 28 mg daily at home. -Continue Memantine 28 mg daily  Anxiety/Depression: Stable. On sertraline 50 mg daily at home. -Continue home sertraline 50 mg daily  DVT/PE ppx: Heparin TID  Dispo: Disposition is deferred at this time, awaiting improvement of current medical problems. Anticipated discharge in approximately 1-2 day(s).   The patient does have a current PCP (Lucy Maryan Puls, PA-C) and does not need an [facility] hospital follow-up appointment after discharge.  The patient does not have transportation limitations that hinder transportation to clinic appointments.  Signed: Osa Craver, DO PGY-1  Internal Medicine Resident Pager # [phone] 06/11/2014 6:51 PM

## 2014-06-11 NOTE — ED Provider Notes (Signed)
CSN: [id number]     Arrival date & time 06/11/14  1126 History   First MD Initiated Contact with Patient 06/11/14 1129     Chief Complaint  Patient presents with  . Weakness     (Consider location/radiation/quality/duration/timing/severity/associated sxs/prior Treatment) Patient is a 80 y.o. female presenting with weakness. The history is provided by the patient and a caregiver. The history is limited by the condition of the patient.  Weakness This is a new problem. The current episode started 1 to 2 hours ago. The problem occurs constantly. The problem has been resolved. Associated symptoms include chest pain, abdominal pain (with 4 episodes of dry-heaving) and shortness of breath. Nothing aggravates the symptoms. Nothing relieves the symptoms. She has tried nothing for the symptoms.    Past Medical History  Diagnosis Date  . Dementia     significant  . Renal disease   . Memory loss 12/10/2012  . Depression 12/10/2012  . Anxiety state, unspecified 12/10/2012  . Spells 12/10/2012  . Essential hypertension, benign 12/10/2012  . Other and unspecified hyperlipidemia 12/10/2012  . Loss of weight 12/10/2012  . Carotid artery stenosis   . Ovarian cancer     treated with surgery & chemo  . Ankle fracture, right   . Unstable gait     mild  . Syncope 12/10/2012    Recurrent, sporadic episodes  . Carotid artery disease   . Parkinson disease    Past Surgical History  Procedure Laterality Date  . Vesicovaginal fistula closure w/ tah  2005  . Oophorectomy  2007    for ovarian cancer  . Prolapsed uterine fibroid ligation    . Appendectomy    . Cataract extraction Bilateral   . Implantable loop recorder  11/14    MDT LINQ implanted for recurrent unexplained syncope by Dr Rayann Heman  . Abdominal hysterectomy     Family History  Problem Relation Age of Onset  . Cancer Mother     breast  . Stroke Father    History  Substance Use Topics  . Smoking status: Never Smoker   . Smokeless  tobacco: Not on file  . Alcohol Use: No   OB History   Grav Para Term Preterm Abortions TAB SAB Ect Mult Living                 Review of Systems  Constitutional: Negative for fever and chills.  Respiratory: Positive for shortness of breath.   Cardiovascular: Positive for chest pain. Negative for leg swelling.  Gastrointestinal: Positive for vomiting and abdominal pain (with 4 episodes of dry-heaving). Negative for nausea and diarrhea.  Neurological: Positive for weakness.  All other systems reviewed and are negative.     Allergies  Iohexol; Ciprofloxacin; Nsaids; Sulfa antibiotics; and Ivp dye  Home Medications   Prior to Admission medications   Medication Sig Start Date End Date Taking? Authorizing Provider  amLODipine (NORVASC) 2.5 MG tablet Take 2.5 mg by mouth daily.    Historical Provider, MD  amoxicillin-clavulanate (AUGMENTIN) 875-125 MG per tablet Take 1 tablet by mouth 2 (two) times daily.    Historical Provider, MD  aspirin EC 81 MG tablet Take 81 mg by mouth daily.    Historical Provider, MD  atorvastatin (LIPITOR) 20 MG tablet Take 20 mg by mouth at bedtime.     Historical Provider, MD  calcium carbonate (OS-CAL) 600 MG TABS tablet Take 600 mg by mouth at bedtime.    Historical Provider, MD  cephALEXin (KEFLEX) 500 MG capsule  Take 2 capsules (1,000 mg total) by mouth 2 (two) times daily. 05/29/14   Charlesetta Shanks, MD  famotidine (PEPCID) 20 MG tablet Take 1 tablet (20 mg total) by mouth 2 (two) times daily. 05/29/14   Charlesetta Shanks, MD  loratadine (CLARITIN) 10 MG tablet Take 10 mg by mouth as needed. 11/30/13   Historical Provider, MD  Memantine HCl ER (NAMENDA XR) 28 MG CP24 Take 28 mg by mouth daily. 04/02/14   Star Age, MD  Multiple Vitamin (MULTIVITAMIN WITH MINERALS) TABS Take 1 tablet by mouth daily.    Historical Provider, MD  nitrofurantoin, macrocrystal-monohydrate, (MACROBID) 100 MG capsule Take 100 mg by mouth at bedtime.    Historical Provider, MD   oxybutynin (DITROPAN-XL) 10 MG 24 hr tablet Take 10 mg by mouth daily.    Historical Provider, MD  polyethylene glycol (MIRALAX / GLYCOLAX) packet Take 17 g by mouth daily as needed for mild constipation. With 8 oz of liquid daily. 12/24/13   Eugenie Filler, MD  sertraline (ZOLOFT) 50 MG tablet Take 50 mg by mouth daily.    Historical Provider, MD   BP 151/68  Pulse 69  Temp(Src) 97.9 F (36.6 C) (Oral)  Resp 21  SpO2 99% Physical Exam  Nursing note and vitals reviewed. Constitutional: She is oriented to person, place, and time. She appears well-developed and well-nourished. No distress.  HENT:  Head: Normocephalic and atraumatic.  Mouth/Throat: Oropharynx is clear and moist.  Eyes: EOM are normal. Pupils are equal, round, and reactive to light.  Neck: Normal range of motion. Neck supple.  Cardiovascular: Normal rate and regular rhythm.  Exam reveals no friction rub.   No murmur heard. Pulmonary/Chest: Effort normal and breath sounds normal. No respiratory distress. She has no wheezes. She has no rales.  Abdominal: Soft. She exhibits no distension. There is no tenderness. There is no rebound.  Musculoskeletal: Normal range of motion. She exhibits no edema.  Neurological: She is alert and oriented to person, place, and time. No cranial nerve deficit. She exhibits normal muscle tone. Coordination normal.  Skin: She is not diaphoretic.    ED Course  Procedures (including critical care time) Labs Review Labs Reviewed - No data to display  Imaging Review No results found.   EKG Interpretation None      MDM   Final diagnoses:  Chest pain, unspecified chest pain type    80 year old female here with episode of yeast weakness, clutching her chest, dry heaving. Caregiver is providing history. She stated after her shower this morning she had an episode where she posterior chest and belly, had 4 episodes of dry heaving, diaphoresis. She does remember this episode and states she  did have some chest pressure. Labs ok, admitted by Internal Medicine for serial cardiac enzymes.   Evelina Bucy, MD 06/11/14 [phone]

## 2014-06-11 NOTE — ED Notes (Signed)
attempted blood draw, unsuccessful. Phlebotomy notified

## 2014-06-11 NOTE — ED Notes (Signed)
Per [facility], pt from home, with home health aid that's been with her x2 weeks. Home health aid stated she was helping pt take a shower and shortly after she had a sudden onset of generalized weakness. Aid stated that pt dry heaved several times but didn't actually vomit. Upon EMS arrival, pt was cool and clammy. Pt hasn't had anything to eat and has not taken any med's this morning. Pt is normally AAOX4 and active. Pt denied pain for EMS, but later states pressure in epigastric area. 93% on RA, 96% on 2L. 12 lead by ems shows right bundle branch block.

## 2014-06-12 ENCOUNTER — Encounter (HOSPITAL_COMMUNITY): Payer: Self-pay | Admitting: General Practice

## 2014-06-12 DIAGNOSIS — I471 Supraventricular tachycardia: Secondary | ICD-10-CM | POA: Diagnosis present

## 2014-06-12 DIAGNOSIS — E785 Hyperlipidemia, unspecified: Secondary | ICD-10-CM

## 2014-06-12 DIAGNOSIS — R079 Chest pain, unspecified: Secondary | ICD-10-CM

## 2014-06-12 DIAGNOSIS — G20 Parkinson's disease: Secondary | ICD-10-CM

## 2014-06-12 DIAGNOSIS — K219 Gastro-esophageal reflux disease without esophagitis: Secondary | ICD-10-CM | POA: Diagnosis present

## 2014-06-12 DIAGNOSIS — I10 Essential (primary) hypertension: Secondary | ICD-10-CM

## 2014-06-12 DIAGNOSIS — F0280 Dementia in other diseases classified elsewhere without behavioral disturbance: Secondary | ICD-10-CM

## 2014-06-12 DIAGNOSIS — R110 Nausea: Secondary | ICD-10-CM | POA: Diagnosis present

## 2014-06-12 DIAGNOSIS — Z87898 Personal history of other specified conditions: Secondary | ICD-10-CM

## 2014-06-12 DIAGNOSIS — F418 Other specified anxiety disorders: Secondary | ICD-10-CM

## 2014-06-12 LAB — COMPREHENSIVE METABOLIC PANEL
ALT: 15 U/L (ref 0–35)
ANION GAP: 16 — AB (ref 5–15)
AST: 21 U/L (ref 0–37)
Albumin: 3.4 g/dL — ABNORMAL LOW (ref 3.5–5.2)
Alkaline Phosphatase: 84 U/L (ref 39–117)
BUN: 31 mg/dL — ABNORMAL HIGH (ref 6–23)
CALCIUM: 9.4 mg/dL (ref 8.4–10.5)
CO2: 26 meq/L (ref 19–32)
CREATININE: 1.26 mg/dL — AB (ref 0.50–1.10)
Chloride: 96 mEq/L (ref 96–112)
GFR, EST AFRICAN AMERICAN: 43 mL/min — AB (ref >90)
GFR, EST NON AFRICAN AMERICAN: 37 mL/min — AB (ref >90)
GLUCOSE: 131 mg/dL — AB (ref 70–99)
Potassium: 4.2 mEq/L (ref 3.7–5.3)
Sodium: 138 mEq/L (ref 137–147)
Total Bilirubin: 0.3 mg/dL (ref 0.3–1.2)
Total Protein: 7 g/dL (ref 6.0–8.3)

## 2014-06-12 LAB — TROPONIN I

## 2014-06-12 MED ORDER — PROMETHAZINE HCL 12.5 MG PO TABS: 12.5000 mg | ORAL_TABLET | Freq: Three times a day (TID) | ORAL | Status: DC | PRN

## 2014-06-12 NOTE — Discharge Summary (Signed)
Name: Alicia [name] MRN: [id number] DOB: 1922-11-06 80 y.o. PCP: Shellia Carwin, PA-C  Date of Admission: 06/11/2014 11:26 AM Date of Discharge: 06/12/2014 Attending Physician: Annia Belt, MD  Discharge Diagnosis:  Principal Problem:   GERD (gastroesophageal reflux disease) Active Problems:   Essential hypertension, benign   Dementia   Nausea   Paroxysmal supraventricular tachycardia   History of syncope  Discharge Medications:   Medication List    STOP taking these medications       cephALEXin 500 MG capsule  Commonly known as:  KEFLEX      TAKE these medications       amLODipine 2.5 MG tablet  Commonly known as:  NORVASC  Take 2.5 mg by mouth daily.     aspirin EC 81 MG tablet  Take 81 mg by mouth daily.     atorvastatin 20 MG tablet  Commonly known as:  LIPITOR  Take 20 mg by mouth at bedtime.     calcium carbonate 600 MG Tabs tablet  Commonly known as:  OS-CAL  Take 600 mg by mouth at bedtime.     famotidine 20 MG tablet  Commonly known as:  PEPCID  Take 1 tablet (20 mg total) by mouth 2 (two) times daily.     loratadine 10 MG tablet  Commonly known as:  CLARITIN  Take 10 mg by mouth as needed for allergies.     Memantine HCl ER 28 MG Cp24  Commonly known as:  NAMENDA XR  Take 28 mg by mouth daily.     multivitamin with minerals Tabs tablet  Take 1 tablet by mouth daily.     oxybutynin 10 MG 24 hr tablet  Commonly known as:  DITROPAN-XL  Take 10 mg by mouth daily.     polyethylene glycol packet  Commonly known as:  MIRALAX / GLYCOLAX  Take 17 g by mouth daily as needed for mild constipation. With 8 oz of liquid daily.     PROAIR RESPICLICK 562 (90 BASE) MCG/ACT Aepb  Generic drug:  Albuterol Sulfate  Inhale 2 puffs into the lungs every 4 (four) hours as needed (shortness of breathe).     promethazine 12.5 MG tablet  Commonly known as:  PHENERGAN  Take 1 tablet (12.5 mg total) by mouth every 8 (eight) hours as needed for  nausea or vomiting.     sertraline 50 MG tablet  Commonly known as:  ZOLOFT  Take 50 mg by mouth daily.        Disposition and follow-up:   AliciaRoniyah C Bango was discharged from [facility] in Good condition.  At the hospital follow up visit please address:  1.  GERD: Please address any recurrent symptoms of nausea, epigastric pain, substernal chest pain, vomiting. If GERD increases, become persistent or if there are any concerning symptoms, we recommend consider outpatient GI referral.  2.  Labs / imaging needed at time of follow-up: None  3.  Pending labs/ test needing follow-up: None  Follow-up Appointments: Follow-up Information   Follow up with [facility], MD On 06/20/2014. (10:30. Appointment is at same place where [name] is. Phone number is [phone].)    Specialty:  Internal Medicine   Contact information:   [place] [id number]       Discharge Instructions: Discharge Instructions   Call MD for:  persistant nausea and vomiting    Complete by:  As directed      Call  MD for:  severe uncontrolled pain    Complete by:  As directed      Call MD for:  temperature >100.4    Complete by:  As directed      Diet - low sodium heart healthy    Complete by:  As directed      Increase activity slowly    Complete by:  As directed            Procedures Performed:  Dg Chest 2 View  05/29/2014   CLINICAL DATA:  Abdominal and chest pain.  Hypertension.  EXAM: CHEST  2 VIEW  COMPARISON:  Chest radiograph performed earlier today at 5:15 p.m.  FINDINGS: The lungs are mildly hypoexpanded. Mild left basilar opacity may reflect atelectasis or scarring, stable from prior studies. A metallic device is noted at the anterior left chest wall.  The heart is normal in size; the mediastinal contour is within normal limits. No acute osseous abnormalities are seen.  IMPRESSION: Lungs mildly hypoexpanded. Mild left  basilar opacity is stable from prior studies and may reflect atelectasis or scarring.   Electronically Signed   By: Garald Balding M.D.   On: 05/29/2014 00:11   Dg Chest Port 1 View  06/11/2014   CLINICAL DATA:  Shortness of breath.  Nausea.  Hypertension.  EXAM: PORTABLE CHEST - 1 VIEW  COMPARISON:  05/28/2014  FINDINGS: Midline trachea. Normal heart size. Moderate right hemidiaphragm elevation. No pleural effusion or pneumothorax. Left base opacity which is similar to on the prior exam.  IMPRESSION: Chronic left base opacity, favored to represent an area of scarring.  Otherwise, no acute finding.   Electronically Signed   By: Abigail Miyamoto M.D.   On: 06/11/2014 12:13   Admission HPI: Alicia [name] is a 80 yo female with PMHx of CAD, HTN, HLD, Parkinsonian Dementia, Carotid Artery Stenosis (1-39% on the left and 40-59% on the right), Anxiety, Depression, and Grade 1 Diastolic CHFwho presented to the ED for complaint of chest pressure. HPI is limited secondary to dementia, most of HPI is taking from ED notes from caretaker. Today, patient's home health aide was helping the patient shower when she had a sudden onset of generalized weakness. Patient also had several episodes of dry heaving and clutching her chest, but did not vomit. Patient was cool, diaphoretic and clammy when EMS arrived and complained of pressure in the epigastric region and chest pressure.  Upon talking with the patient, she denies any chest pain, but states she may be a little short of breath and admits to weakness. She does not remember the episode from earlier. She denies any headache, dizziness, chest tightness, palpitations, nausea, vomiting, diarrhea or constipation.  In the ED, patient's EKG showed sinus rhythm with RBBB, unchanged from priors, POC troponin was normal and CXR showed a left base opacity likely from scarring and no acute disease.   Hospital Course by problem list: Principal Problem:   GERD (gastroesophageal reflux  disease) Active Problems:   Essential hypertension, benign   Dementia   Nausea   Paroxysmal supraventricular tachycardia   History of syncope   GERD: Patient originally presented after an episode of chest pressure with dry heaving, weakness and diaphoresis. Patient denied any complaints but admitted to some weakness and shortness of breath. Her troponins were negative, EKG showed no signs of active ischemia, and CXR shows no acute disease. We doubted any infectious cause since patient did not have leukocytosis, was afebrile, and had no more complaints of nausea  or vomiting. The morning of discharge, patient denied any chest pain, shortness of breath, nausea or abdominal pain. Physical exam was unremarkable.Repeat EKG showed normal sinus rhythm with RBBB which is unchanged from prior EKGs. There were no signs of ischemia. Symptoms are likely due to GERD causing nausea, dry heaving and epigastric/substernal chest pain and pressure. Patient follows with her PCP for symptoms. I have recommended possible outpatient GI referral if symptoms are not well controlled, persistent or increase in severity. This was discussed with patient's daughter-in-law whom she lives with. I prescribed phenergan 12.5 mg Q8H prn nausea on discharge to be used for nausea symptoms. Patient will follow up with Dr. Theodis Sato on October 9th, 2015 at 10:30 am.   HTN: Patient was originally hypertensive in the 448J-856D systolic on admission. We is on amlodipine 10 mg daily at home which we continued during stay and on discharge. On day of discharge, patient's blood pressure was improved to 119/60.  HLD: Patient is on atorvastatin 20 mg daily at home which we continued during stay and on discharge.    Parkinson's Disease with Dementia: Patient is oriented to self only. She is on memantine 28 mg daily at home which we continued during stay and on discharge.  Anxiety/Depression: Stable on admission. Patient was very pleasant. She is on  sertraline 50 mg daily at home which we continued during stay and on discharge.  Discharge Vitals:   BP 119/60  Pulse 78  Temp(Src) 98.4 F (36.9 C) (Oral)  Resp 14  SpO2 95%  Discharge Labs:  Results for orders placed during the hospital encounter of 06/11/14 (from the past 24 hour(s))  CBC     Status: None   Collection Time    06/11/14 11:42 AM      Result Value Ref Range   WBC 8.5  4.0 - 10.5 K/uL   RBC 4.25  3.87 - 5.11 MIL/uL   Hemoglobin 13.3  12.0 - 15.0 g/dL   HCT 40.4  36.0 - 46.0 %   MCV 95.1  78.0 - 100.0 fL   MCH 31.3  26.0 - 34.0 pg   MCHC 32.9  30.0 - 36.0 g/dL   RDW 14.1  11.5 - 15.5 %   Platelets 184  150 - 400 K/uL  BASIC METABOLIC PANEL     Status: Abnormal   Collection Time    06/11/14 11:42 AM      Result Value Ref Range   Sodium 139  137 - 147 mEq/L   Potassium 4.7  3.7 - 5.3 mEq/L   Chloride 101  96 - 112 mEq/L   CO2 26  19 - 32 mEq/L   Glucose, Bld 119 (*) 70 - 99 mg/dL   BUN 34 (*) 6 - 23 mg/dL   Creatinine, Ser 1.36 (*) 0.50 - 1.10 mg/dL   Calcium 9.7  8.4 - 10.5 mg/dL   GFR calc non Af Amer 34 (*) >90 mL/min   GFR calc Af Amer 39 (*) >90 mL/min   Anion gap 12  5 - 15  PRO B NATRIURETIC PEPTIDE     Status: None   Collection Time    06/11/14 11:42 AM      Result Value Ref Range   Pro B Natriuretic peptide (BNP) 390.1  0 - 450 pg/mL  I-STAT TROPOININ, ED     Status: None   Collection Time    06/11/14  1:32 PM      Result Value Ref Range   Troponin i, poc  0.02  0.00 - 0.08 ng/mL   Comment 3           TROPONIN I     Status: None   Collection Time    06/11/14  7:20 PM      Result Value Ref Range   Troponin I <0.30  <0.30 ng/mL  COMPREHENSIVE METABOLIC PANEL     Status: Abnormal   Collection Time    06/12/14  1:23 AM      Result Value Ref Range   Sodium 138  137 - 147 mEq/L   Potassium 4.2  3.7 - 5.3 mEq/L   Chloride 96  96 - 112 mEq/L   CO2 26  19 - 32 mEq/L   Glucose, Bld 131 (*) 70 - 99 mg/dL   BUN 31 (*) 6 - 23 mg/dL    Creatinine, Ser 1.26 (*) 0.50 - 1.10 mg/dL   Calcium 9.4  8.4 - 10.5 mg/dL   Total Protein 7.0  6.0 - 8.3 g/dL   Albumin 3.4 (*) 3.5 - 5.2 g/dL   AST 21  0 - 37 U/L   ALT 15  0 - 35 U/L   Alkaline Phosphatase 84  39 - 117 U/L   Total Bilirubin 0.3  0.3 - 1.2 mg/dL   GFR calc non Af Amer 37 (*) >90 mL/min   GFR calc Af Amer 43 (*) >90 mL/min   Anion gap 16 (*) 5 - 15  TROPONIN I     Status: None   Collection Time    06/12/14  1:23 AM      Result Value Ref Range   Troponin I <0.30  <0.30 ng/mL    Signed: Osa Craver, DO PGY-1 Internal Medicine Resident Pager # [phone] 06/12/2014 11:23 AM

## 2014-06-12 NOTE — Consult Note (Addendum)
CARDIOLOGY CONSULT NOTE   Patient ID: Alicia [name] MRN: [id number] DOB/AGE: 1923/05/28 80 y.o.  Admit Date: 06/11/2014 Primary Physician: Shellia Carwin, PA-C  Primary Cardiologist    Wynonia Lawman    [facility])   Clinical Summary Alicia [name] is a 80 y.o.female.  She is followed from the cardiology viewpoint by Dr. Wynonia Lawman. I'm seeing her this morning to help with rapid evaluation in the chest pain unit. There is a history in the past of syncope. The patient has a LINQ implantable recorder. This has been followed by Dr. Rayann Heman by the electrophysiology team. No definite etiology for syncope has been found over time. The patient has significant dementia. Yesterday she developed some chest discomfort. When she was first evaluated she had some diaphoresis. After coming to the hospital he became clearer that nausea and diaphoresis were her symptoms. No definite cardiac abnormalities have been documented yet. The patient is DO NOT RESUSCITATE.   Allergies  Allergen Reactions  . Iohexol Rash  . Ativan [Lorazepam]     MAKES HER AGITATED  . Ciprofloxacin Other (See Comments)    weakness  . Nsaids     Renal insufficiency  . Sulfa Antibiotics Hives and Itching  . Ivp Dye [Iodinated Diagnostic Agents] Rash    Medications Scheduled Medications: . amLODipine  2.5 mg Oral Daily  . aspirin EC  81 mg Oral Daily  . atorvastatin  20 mg Oral QHS  . calcium carbonate  1 tablet Oral QHS  . famotidine  20 mg Oral BID  . heparin  5,000 Units Subcutaneous 3 times per day  . Memantine HCl ER  28 mg Oral Daily  . multivitamin with minerals  1 tablet Oral Daily  . oxybutynin  10 mg Oral Daily  . sertraline  50 mg Oral Daily     Infusions:     PRN Medications:  acetaminophen, loratadine, ondansetron (ZOFRAN) IV, polyethylene glycol   Past Medical History  Diagnosis Date  . Dementia     significant  . Renal disease   . Memory loss 12/10/2012  . Depression 12/10/2012  . Anxiety  state, unspecified 12/10/2012  . Spells 12/10/2012  . Essential hypertension, benign 12/10/2012  . Other and unspecified hyperlipidemia 12/10/2012  . Loss of weight 12/10/2012  . Carotid artery stenosis   . Ovarian cancer     treated with surgery & chemo  . Ankle fracture, right   . Unstable gait     mild  . Syncope 12/10/2012    Recurrent, sporadic episodes  . Carotid artery disease   . Parkinson disease   . Diabetes mellitus without complication     Past Surgical History  Procedure Laterality Date  . Vesicovaginal fistula closure w/ tah  2005  . Oophorectomy  2007    for ovarian cancer  . Prolapsed uterine fibroid ligation    . Appendectomy    . Cataract extraction Bilateral   . Implantable loop recorder  11/14    MDT LINQ implanted for recurrent unexplained syncope by Dr Rayann Heman  . Abdominal hysterectomy    . Ankle surgery Left     Family History  Problem Relation Age of Onset  . Cancer Mother     breast  . Stroke Father     Social History [name] reports that she has never smoked. She has never used smokeless tobacco. [name] reports that she does not drink alcohol.  Review of Systems  Currently the patient denies fever, chills, headache, sweats, rash,  change in vision, change in hearing, cough. All other systems are reviewed and are negative other than the history of present illness.  Physical Examination Blood pressure 119/60, pulse 78, temperature 98.4 F (36.9 C), temperature source Oral, resp. rate 14, SpO2 95.00%. No intake or output data in the 24 hours ending 06/12/14 0910  This elderly patient is lying comfortably in bed. She's not having any symptoms at this time. She is oriented to person. Head is atraumatic. There is no jugulovenous distention. Lungs reveal scattered rhonchi. Cardiac exam reveals S1 and S2. Abdomen is soft. There is no peripheral edema. There are no musculoskeletal deformities. There are no skin rashes.  Prior Cardiac  Testing/Procedures  Lab Results  Basic Metabolic Panel:  Recent Labs Lab 06/11/14 1142 06/12/14 0123  NA 139 138  K 4.7 4.2  CL 101 96  CO2 26 26  GLUCOSE 119* 131*  BUN 34* 31*  CREATININE 1.36* 1.26*  CALCIUM 9.7 9.4    Liver Function Tests:  Recent Labs Lab 06/12/14 0123  AST 21  ALT 15  ALKPHOS 84  BILITOT 0.3  PROT 7.0  ALBUMIN 3.4*    CBC:  Recent Labs Lab 06/11/14 1142  WBC 8.5  HGB 13.3  HCT 40.4  MCV 95.1  PLT 184    Cardiac Enzymes:  Recent Labs Lab 06/11/14 1920 06/12/14 0123  TROPONINI <0.30 <0.30    BNP: No components found with this basename: POCBNP,    Radiology: Dg Chest Port 1 View  06/11/2014   CLINICAL DATA:  Shortness of breath.  Nausea.  Hypertension.  EXAM: PORTABLE CHEST - 1 VIEW  COMPARISON:  05/28/2014  FINDINGS: Midline trachea. Normal heart size. Moderate right hemidiaphragm elevation. No pleural effusion or pneumothorax. Left base opacity which is similar to on the prior exam.  IMPRESSION: Chronic left base opacity, favored to represent an area of scarring.  Otherwise, no acute finding.   Electronically Signed   By: Abigail Miyamoto M.D.   On: 06/11/2014 12:13    ECG:   I have reviewed current EKGs and recent EKGs. She has sinus rhythm. There is right bundle branch block on the 12-lead at this time. This has been seen in most recent EKGs. There is no acute change.  Telemetry:   There is normal sinus rhythm. There is one 10 beat run of supraventricular tachycardia. The rate is approximately 130. It may be a reentrant tachycardia.   Impression and Recommendations    Essential hypertension, benign   Dementia    Chest pain     It appears that the patient may have had some chest discomfort initially. However with further history, it appears that she actually had nausea. Her EKG shows no significant change. Troponins are normal. She feels much better today. I would recommend no further cardiac workup at this time.     Nausea    This appears to be improved since she was admitted.    Paroxysmal supraventricular tachycardia     There is 10 beats of supraventricular tachycardia on the monitor. At this point I would recommend no change in therapy. This was self-limited with a rate of 130.    History of syncope    There is a history of syncope. She has a LINQ implantable recorder that has been followed by Dr. Rayann Heman. She has not had any marked documented arrhythmias. No further workup at this time.   At this time I would recommend no further cardiac workup. Our team will sign off. I  will send a copy of my note to Dr. Wynonia Lawman.  Any post hospital cardiology followup should be with Dr. Wynonia Lawman.  06/12/2014, 9:10 AM

## 2014-06-12 NOTE — Progress Notes (Signed)
UR completed 

## 2014-06-12 NOTE — H&P (Signed)
Attending physician admission note: I personally interviewed and examined this patient and reviewed the lab database and pertinent images. History, physical examination, evaluation, and management plan, accurate as recorded by resident physician Dr. Osa Craver.  Clinical summary: Pleasant 80 year old woman with coronary artery disease, grade 1 diastolic dysfunction, and dementia living in a extended care facility. Caregivers became concerned when she developed sudden onset of generalized weakness while they were assisting her to bathe and appeared to be clutching her chest. She was brought to the hospital for further evaluation. She has consistently denied any chest pain, pressure, or dyspnea. Electrocardiogram shows right bundle branch block unchanged from prior tracings with no evidence for acute ischemic change. Chest radiograph shows no signs of congestion or infection. Troponin levels have been undetectable so far. Pro BNP is not elevated. Additional Lab studies show no electrolyte abnormalities, no anemia or leukocytosis, and no change in her chronic mild renal dysfunction. There are no acute neurologic changes on exam.  Impression: It is not clear what the caregiver observed but there does not appear to be any acute changes in her condition. She was observed overnight pending serial troponin levels and cardiograms. She is otherwise stable and we anticipate discharge back to her extended care facility later today.  Alicia Hopper, MD, [place]  Hematology-Oncology/Internal Medicine

## 2014-06-12 NOTE — Progress Notes (Signed)
Subjective:  Patient was seen and examined this morning. She denies any complaints including headache, weakness, chest pain, shortness of breath, nausea, vomiting or abdominal pain.  Objective: Vital signs in last 24 hours: Filed Vitals:   06/11/14 2100 06/12/14 0040 06/12/14 0500 06/12/14 0755  BP: 142/63 125/39 111/65 119/60  Pulse: 92 85 80 78  Temp: 98.2 F (36.8 C) 98 F (36.7 C) 97.5 F (36.4 C) 98.4 F (36.9 C)  TempSrc: Oral Oral Oral Oral  Resp: 16 18 18 14   SpO2: 91% 97% 95% 95%   Weight change:   Intake/Output Summary (Last 24 hours) at 06/12/14 1114 Last data filed at 06/12/14 0900  Gross per 24 hour  Intake    240 ml  Output      0 ml  Net    240 ml   Filed Vitals:   06/11/14 2100 06/12/14 0040 06/12/14 0500 06/12/14 0755  BP: 142/63 125/39 111/65 119/60  Pulse: 92 85 80 78  Temp: 98.2 F (36.8 C) 98 F (36.7 C) 97.5 F (36.4 C) 98.4 F (36.9 C)  TempSrc: Oral Oral Oral Oral  Resp: 16 18 18 14   SpO2: 91% 97% 95% 95%   General: Vital signs reviewed.  Patient is well-developed and well-nourished, in no acute distress and cooperative with exam.  Cardiovascular: RRR, S1 normal, S2 normal, no murmurs, gallops, or rubs. Pulmonary/Chest: Clear to auscultation bilaterally, no wheezes, rales, or rhonchi. Abdominal: Soft, non-tender, non-distended, BS +, voluntary guarding. Musculoskeletal: No joint deformities, erythema, or stiffness, ROM full and nontender. Extremities: No lower extremity edema bilaterally,  pulses symmetric and intact bilaterally. No cyanosis or clubbing. Neurological: A&O x1, Strength is decreased in upper extremities bilaterally 4/5, cranial nerve II-XII are grossly intact, no focal motor deficit, normal reflexes, normal finger to nose.  Skin: Warm, dry and intact. No rashes or erythema. Psychiatric: Normal mood and affect. speech and behavior is normal. Cognition and memory are abnormal.   Lab Results: Basic Metabolic Panel:  Recent  Labs Lab 06/11/14 1142 06/12/14 0123  NA 139 138  K 4.7 4.2  CL 101 96  CO2 26 26  GLUCOSE 119* 131*  BUN 34* 31*  CREATININE 1.36* 1.26*  CALCIUM 9.7 9.4   Liver Function Tests:  Recent Labs Lab 06/12/14 0123  AST 21  ALT 15  ALKPHOS 84  BILITOT 0.3  PROT 7.0  ALBUMIN 3.4*   CBC:  Recent Labs Lab 06/11/14 1142  WBC 8.5  HGB 13.3  HCT 40.4  MCV 95.1  PLT 184   Cardiac Enzymes:  Recent Labs Lab 06/11/14 1920 06/12/14 0123  TROPONINI <0.30 <0.30   BNP:  Recent Labs Lab 06/11/14 1142  PROBNP 390.1   Studies/Results: Dg Chest Port 1 View  06/11/2014   CLINICAL DATA:  Shortness of breath.  Nausea.  Hypertension.  EXAM: PORTABLE CHEST - 1 VIEW  COMPARISON:  05/28/2014  FINDINGS: Midline trachea. Normal heart size. Moderate right hemidiaphragm elevation. No pleural effusion or pneumothorax. Left base opacity which is similar to on the prior exam.  IMPRESSION: Chronic left base opacity, favored to represent an area of scarring.  Otherwise, no acute finding.   Electronically Signed   By: Abigail Miyamoto M.D.   On: 06/11/2014 12:13   Medications:  I have reviewed the patient's current medications. Prior to Admission:  Prescriptions prior to admission  Medication Sig Dispense Refill  . Albuterol Sulfate (PROAIR RESPICLICK) 300 (90 BASE) MCG/ACT AEPB Inhale 2 puffs into the lungs every 4 (  four) hours as needed (shortness of breathe).       Marland Kitchen amLODipine (NORVASC) 2.5 MG tablet Take 2.5 mg by mouth daily.      Marland Kitchen aspirin EC 81 MG tablet Take 81 mg by mouth daily.      Marland Kitchen atorvastatin (LIPITOR) 20 MG tablet Take 20 mg by mouth at bedtime.       . calcium carbonate (OS-CAL) 600 MG TABS tablet Take 600 mg by mouth at bedtime.      . cephALEXin (KEFLEX) 500 MG capsule Take 2 capsules (1,000 mg total) by mouth 2 (two) times daily.  28 capsule  0  . famotidine (PEPCID) 20 MG tablet Take 1 tablet (20 mg total) by mouth 2 (two) times daily.  10 tablet  0  . loratadine  (CLARITIN) 10 MG tablet Take 10 mg by mouth as needed for allergies.       . Memantine HCl ER (NAMENDA XR) 28 MG CP24 Take 28 mg by mouth daily.  90 capsule  3  . Multiple Vitamin (MULTIVITAMIN WITH MINERALS) TABS Take 1 tablet by mouth daily.      Marland Kitchen oxybutynin (DITROPAN-XL) 10 MG 24 hr tablet Take 10 mg by mouth daily.      . polyethylene glycol (MIRALAX / GLYCOLAX) packet Take 17 g by mouth daily as needed for mild constipation. With 8 oz of liquid daily.      . sertraline (ZOLOFT) 50 MG tablet Take 50 mg by mouth daily.       Scheduled Meds: . amLODipine  2.5 mg Oral Daily  . aspirin EC  81 mg Oral Daily  . atorvastatin  20 mg Oral QHS  . calcium carbonate  1 tablet Oral QHS  . famotidine  20 mg Oral BID  . heparin  5,000 Units Subcutaneous 3 times per day  . Memantine HCl ER  28 mg Oral Daily  . multivitamin with minerals  1 tablet Oral Daily  . oxybutynin  10 mg Oral Daily  . sertraline  50 mg Oral Daily   Continuous Infusions:  PRN Meds:.acetaminophen, loratadine, ondansetron (ZOFRAN) IV, polyethylene glycol Assessment/Plan: Principal Problem:   GERD (gastroesophageal reflux disease) Active Problems:   Essential hypertension, benign   Dementia   Nausea   Paroxysmal supraventricular tachycardia   History of syncope  GERD: Patient denies any chest pain, shortness of breath, nausea or abdominal pain. Physical exam is unremarkable. Troponins were negative. EKG and repeat EKG show normal sinus rhythm with RBBB which is unchanged from prior EKGs. No signs of ischemia. Symptoms are likely due to GERD causing nausea, dry heaving and epigastric/substernal chest pain and pressure. Patient follows with her PCP for symptoms. I have recommended possible outpatient GI referral if symptoms are not well controlled, persistent or increase in severity. This was discussed with patient's daughter-in-law whom she lives with. Likely discharge to home today. -Phenergan 12.5 mg Q8H prn nausea -ASA 81  mg daily  -Atorvastatin 20 mg daily  -Famotidine 20 mg BID  -Zofran 4 mg IV Q6H prn  -Cardiac diet  -Trend troponins   HTN: Blood pressure better controlled today 111/65-125/39. On amlodipine 10 mg daily at home.  -Continue home amlodipine 10 mg daily   Parkinson's Disease with Dementia: Patient is oriented to self only. She is on memantine 28 mg daily at home.  -Continue Memantine 28 mg daily   DVT/PE ppx: Heparin TID   Dispo: Disposition is deferred at this time, awaiting improvement of current medical problems.  Anticipated  discharge in approximately 1-2 day(s).   The patient does have a current PCP (Lucy Maryan Puls, PA-C) and does not need an [facility] hospital follow-up appointment after discharge.  The patient does not have transportation limitations that hinder transportation to clinic appointments.  .Services Needed at time of discharge: Y = Yes, Blank = No PT:   OT:   RN:   Equipment:   Other:     LOS: 1 day   Osa Craver, DO PGY-1 Internal Medicine Resident Pager # [phone] 06/12/2014 11:14 AM

## 2014-06-12 NOTE — Discharge Summary (Signed)
Attending physician discharge note: I examined this patient on the day of discharge. Medical history, presenting problems, physical findings, lab database and electrocardiograms, reviewed with medical resident Dr. Osa Craver. I attest to the accuracy of her discharge evaluation and management plan. Murriel Hopper, MD, [facility]/Internal Medicine

## 2014-06-12 NOTE — Discharge Instructions (Signed)
Thank you for allowing [place] to be involved in your healthcare while you were hospitalized at [facility].   Please note that there have been changes to your home medications.  --> PLEASE LOOK AT YOUR DISCHARGE MEDICATION LIST FOR DETAILS.  Please call your PCP if you have any questions or concerns, or any difficulty getting any of your medications.  Please return to the ER if you have worsening of your symptoms or new severe symptoms arise.  Please consider talking with your primary care physician about a gastroenterology referral is the symptoms persist. I have prescribed phenergan 12.5 mg AS NEEDED, but no more than 3 in one day, for nausea if she she needs it. This medications is SEDATING and can make her tired.  Gastroesophageal Reflux Disease, Adult Gastroesophageal reflux disease (GERD) happens when acid from your stomach flows up into the esophagus. When acid comes in contact with the esophagus, the acid causes soreness (inflammation) in the esophagus. Over time, GERD may create small holes (ulcers) in the lining of the esophagus. CAUSES   Increased body weight. This puts pressure on the stomach, making acid rise from the stomach into the esophagus.  Smoking. This increases acid production in the stomach.  Drinking alcohol. This causes decreased pressure in the lower esophageal sphincter (valve or ring of muscle between the esophagus and stomach), allowing acid from the stomach into the esophagus.  Late evening meals and a full stomach. This increases pressure and acid production in the stomach.  A malformed lower esophageal sphincter. Sometimes, no cause is found. SYMPTOMS   Burning pain in the lower part of the mid-chest behind the breastbone and in the mid-stomach area. This may occur twice a week or more often.  Trouble swallowing.  Sore throat.  Dry cough.  Asthma-like symptoms including chest tightness, shortness of breath, or wheezing. DIAGNOSIS    Your caregiver may be able to diagnose GERD based on your symptoms. In some cases, X-rays and other tests may be done to check for complications or to check the condition of your stomach and esophagus. TREATMENT  Your caregiver may recommend over-the-counter or prescription medicines to help decrease acid production. Ask your caregiver before starting or adding any new medicines.  HOME CARE INSTRUCTIONS   Change the factors that you can control. Ask your caregiver for guidance concerning weight loss, quitting smoking, and alcohol consumption.  Avoid foods and drinks that make your symptoms worse, such as:  Caffeine or alcoholic drinks.  Chocolate.  Peppermint or mint flavorings.  Garlic and onions.  Spicy foods.  Citrus fruits, such as oranges, lemons, or limes.  Tomato-based foods such as sauce, chili, salsa, and pizza.  Fried and fatty foods.  Avoid lying down for the 3 hours prior to your bedtime or prior to taking a nap.  Eat small, frequent meals instead of large meals.  Wear loose-fitting clothing. Do not wear anything tight around your waist that causes pressure on your stomach.  Raise the head of your bed 6 to 8 inches with wood blocks to help you sleep. Extra pillows will not help.  Only take over-the-counter or prescription medicines for pain, discomfort, or fever as directed by your caregiver.  Do not take aspirin, ibuprofen, or other nonsteroidal anti-inflammatory drugs (NSAIDs). SEEK IMMEDIATE MEDICAL CARE IF:   You have pain in your arms, neck, jaw, teeth, or back.  Your pain increases or changes in intensity or duration.  You develop nausea, vomiting, or sweating (diaphoresis).  You  develop shortness of breath, or you faint.  Your vomit is green, yellow, black, or looks like coffee grounds or blood.  Your stool is red, bloody, or black. These symptoms could be signs of other problems, such as heart disease, gastric bleeding, or esophageal  bleeding. MAKE SURE YOU:   Understand these instructions.  Will watch your condition.  Will get help right away if you are not doing well or get worse. Document Released: 06/08/2005 Document Revised: 11/21/2011 Document Reviewed: 03/18/2011 [facility] Patient Information 2015 [place], [place]. This information is not intended to replace advice given to you by your health care provider. Make sure you discuss any questions you have with your health care provider.

## 2014-06-13 ENCOUNTER — Encounter (HOSPITAL_COMMUNITY): Payer: Self-pay | Admitting: Emergency Medicine

## 2014-06-13 ENCOUNTER — Emergency Department (HOSPITAL_COMMUNITY): Payer: Medicare Other

## 2014-06-13 ENCOUNTER — Inpatient Hospital Stay (HOSPITAL_COMMUNITY)
Admission: EM | Admit: 2014-06-13 | Discharge: 2014-06-18 | DRG: 389 | Disposition: A | Discharge status: Skilled Nursing Facility | Payer: Medicare Other | Attending: Oncology | Admitting: Oncology

## 2014-06-13 DIAGNOSIS — E785 Hyperlipidemia, unspecified: Secondary | ICD-10-CM | POA: Diagnosis present

## 2014-06-13 DIAGNOSIS — Z9221 Personal history of antineoplastic chemotherapy: Secondary | ICD-10-CM

## 2014-06-13 DIAGNOSIS — K565 Intestinal adhesions [bands] with obstruction (postprocedural) (postinfection): Secondary | ICD-10-CM | POA: Diagnosis present

## 2014-06-13 DIAGNOSIS — G3183 Dementia with Lewy bodies: Secondary | ICD-10-CM | POA: Diagnosis present

## 2014-06-13 DIAGNOSIS — F329 Major depressive disorder, single episode, unspecified: Secondary | ICD-10-CM | POA: Diagnosis present

## 2014-06-13 DIAGNOSIS — K219 Gastro-esophageal reflux disease without esophagitis: Secondary | ICD-10-CM | POA: Diagnosis present

## 2014-06-13 DIAGNOSIS — F411 Generalized anxiety disorder: Secondary | ICD-10-CM | POA: Diagnosis present

## 2014-06-13 DIAGNOSIS — I10 Essential (primary) hypertension: Secondary | ICD-10-CM

## 2014-06-13 DIAGNOSIS — K449 Diaphragmatic hernia without obstruction or gangrene: Secondary | ICD-10-CM | POA: Diagnosis present

## 2014-06-13 DIAGNOSIS — E119 Type 2 diabetes mellitus without complications: Secondary | ICD-10-CM | POA: Diagnosis present

## 2014-06-13 DIAGNOSIS — J9811 Atelectasis: Secondary | ICD-10-CM | POA: Diagnosis present

## 2014-06-13 DIAGNOSIS — E860 Dehydration: Secondary | ICD-10-CM | POA: Diagnosis present

## 2014-06-13 DIAGNOSIS — I129 Hypertensive chronic kidney disease with stage 1 through stage 4 chronic kidney disease, or unspecified chronic kidney disease: Secondary | ICD-10-CM | POA: Diagnosis present

## 2014-06-13 DIAGNOSIS — Z8543 Personal history of malignant neoplasm of ovary: Secondary | ICD-10-CM

## 2014-06-13 DIAGNOSIS — F0280 Dementia in other diseases classified elsewhere without behavioral disturbance: Secondary | ICD-10-CM | POA: Diagnosis present

## 2014-06-13 DIAGNOSIS — N179 Acute kidney failure, unspecified: Secondary | ICD-10-CM | POA: Diagnosis present

## 2014-06-13 DIAGNOSIS — E871 Hypo-osmolality and hyponatremia: Secondary | ICD-10-CM | POA: Diagnosis present

## 2014-06-13 DIAGNOSIS — R1114 Bilious vomiting: Secondary | ICD-10-CM

## 2014-06-13 DIAGNOSIS — Z79899 Other long term (current) drug therapy: Secondary | ICD-10-CM

## 2014-06-13 DIAGNOSIS — Z7982 Long term (current) use of aspirin: Secondary | ICD-10-CM | POA: Diagnosis not present

## 2014-06-13 DIAGNOSIS — I2510 Atherosclerotic heart disease of native coronary artery without angina pectoris: Secondary | ICD-10-CM | POA: Diagnosis present

## 2014-06-13 DIAGNOSIS — E1165 Type 2 diabetes mellitus with hyperglycemia: Secondary | ICD-10-CM | POA: Diagnosis present

## 2014-06-13 DIAGNOSIS — F0390 Unspecified dementia without behavioral disturbance: Secondary | ICD-10-CM

## 2014-06-13 DIAGNOSIS — N184 Chronic kidney disease, stage 4 (severe): Secondary | ICD-10-CM | POA: Diagnosis present

## 2014-06-13 DIAGNOSIS — K5669 Other intestinal obstruction: Secondary | ICD-10-CM | POA: Diagnosis present

## 2014-06-13 DIAGNOSIS — K56600 Partial intestinal obstruction, unspecified as to cause: Secondary | ICD-10-CM | POA: Diagnosis present

## 2014-06-13 LAB — URINALYSIS, ROUTINE W REFLEX MICROSCOPIC
Bilirubin Urine: NEGATIVE
GLUCOSE, UA: NEGATIVE mg/dL
HGB URINE DIPSTICK: NEGATIVE
Ketones, ur: 15 mg/dL — AB
Leukocytes, UA: NEGATIVE
Nitrite: NEGATIVE
Protein, ur: NEGATIVE mg/dL
Specific Gravity, Urine: 1.025 (ref 1.005–1.030)
Urobilinogen, UA: 0.2 mg/dL (ref 0.0–1.0)
pH: 5 (ref 5.0–8.0)

## 2014-06-13 LAB — CBC WITH DIFFERENTIAL/PLATELET
BASOS ABS: 0 10*3/uL (ref 0.0–0.1)
Basophils Relative: 0 % (ref 0–1)
Eosinophils Absolute: 0 10*3/uL (ref 0.0–0.7)
Eosinophils Relative: 0 % (ref 0–5)
HEMATOCRIT: 41.6 % (ref 36.0–46.0)
Hemoglobin: 14 g/dL (ref 12.0–15.0)
LYMPHS PCT: 17 % (ref 12–46)
Lymphs Abs: 1.1 10*3/uL (ref 0.7–4.0)
MCH: 31.3 pg (ref 26.0–34.0)
MCHC: 33.7 g/dL (ref 30.0–36.0)
MCV: 93.1 fL (ref 78.0–100.0)
MONO ABS: 0.6 10*3/uL (ref 0.1–1.0)
Monocytes Relative: 8 % (ref 3–12)
NEUTROS ABS: 5 10*3/uL (ref 1.7–7.7)
NEUTROS PCT: 75 % (ref 43–77)
Platelets: 223 10*3/uL (ref 150–400)
RBC: 4.47 MIL/uL (ref 3.87–5.11)
RDW: 14.1 % (ref 11.5–15.5)
WBC: 6.6 10*3/uL (ref 4.0–10.5)

## 2014-06-13 LAB — COMPREHENSIVE METABOLIC PANEL
ALBUMIN: 3.3 g/dL — AB (ref 3.5–5.2)
ALT: 15 U/L (ref 0–35)
AST: 20 U/L (ref 0–37)
Alkaline Phosphatase: 85 U/L (ref 39–117)
Anion gap: 16 — ABNORMAL HIGH (ref 5–15)
BILIRUBIN TOTAL: 0.4 mg/dL (ref 0.3–1.2)
BUN: 38 mg/dL — ABNORMAL HIGH (ref 6–23)
CHLORIDE: 96 meq/L (ref 96–112)
CO2: 21 mEq/L (ref 19–32)
CREATININE: 1.51 mg/dL — AB (ref 0.50–1.10)
Calcium: 9 mg/dL (ref 8.4–10.5)
GFR calc Af Amer: 34 mL/min — ABNORMAL LOW (ref >90)
GFR calc non Af Amer: 30 mL/min — ABNORMAL LOW (ref >90)
Glucose, Bld: 193 mg/dL — ABNORMAL HIGH (ref 70–99)
POTASSIUM: 4.7 meq/L (ref 3.7–5.3)
SODIUM: 133 meq/L — AB (ref 137–147)
Total Protein: 7.2 g/dL (ref 6.0–8.3)

## 2014-06-13 LAB — LIPASE, BLOOD: Lipase: 21 U/L (ref 11–59)

## 2014-06-13 LAB — I-STAT TROPONIN, ED: Troponin i, poc: 0.03 ng/mL (ref 0.00–0.08)

## 2014-06-13 MED ORDER — PANTOPRAZOLE SODIUM 40 MG PO TBEC: 40.0000 mg | DELAYED_RELEASE_TABLET | Freq: Every day | ORAL | Status: DC

## 2014-06-13 MED ORDER — LIDOCAINE HCL 2 % EX GEL: 1.0000 "application " | Freq: Once | CUTANEOUS | Status: DC

## 2014-06-13 MED ORDER — LIDOCAINE VISCOUS 2 % MT SOLN: 15.0000 mL | Freq: Once | OROMUCOSAL | Status: DC

## 2014-06-13 MED ORDER — POTASSIUM CHLORIDE IN NACL 20-0.9 MEQ/L-% IV SOLN
INTRAVENOUS | Status: DC
  Filled 2014-06-13 (×2): qty 1000

## 2014-06-13 MED ORDER — ONDANSETRON HCL 4 MG/2ML IJ SOLN
4.0000 mg | Freq: Once | INTRAMUSCULAR | Status: AC
  Administered 2014-06-13: 4 mg via INTRAVENOUS
  Filled 2014-06-13: qty 2

## 2014-06-13 MED ORDER — ONDANSETRON HCL 4 MG/2ML IJ SOLN: 4.0000 mg | Freq: Four times a day (QID) | INTRAMUSCULAR | Status: DC | PRN

## 2014-06-13 MED ORDER — FENTANYL CITRATE 0.05 MG/ML IJ SOLN: 12.5000 ug | INTRAMUSCULAR | Status: DC | PRN

## 2014-06-13 MED ORDER — ACETAMINOPHEN 650 MG RE SUPP: 650.0000 mg | Freq: Four times a day (QID) | RECTAL | Status: DC | PRN

## 2014-06-13 MED ORDER — DEXTROSE-NACL 5-0.45 % IV SOLN: INTRAVENOUS | Status: DC

## 2014-06-13 MED ORDER — SODIUM CHLORIDE 0.9 % IV BOLUS (SEPSIS)
1000.0000 mL | Freq: Once | INTRAVENOUS | Status: AC
  Administered 2014-06-13: 1000 mL via INTRAVENOUS

## 2014-06-13 MED ORDER — BARIUM SULFATE 2.1 % PO SUSP: 900.0000 mL | Freq: Once | ORAL | Status: DC

## 2014-06-13 MED ORDER — SODIUM CHLORIDE 0.9 % IV BOLUS (SEPSIS)
200.0000 mL | Freq: Once | INTRAVENOUS | Status: AC
  Administered 2014-06-13: 12:00:00 via INTRAVENOUS

## 2014-06-13 MED ORDER — ALBUTEROL SULFATE (2.5 MG/3ML) 0.083% IN NEBU: 3.0000 mL | INHALATION_SOLUTION | RESPIRATORY_TRACT | Status: DC | PRN

## 2014-06-13 MED ORDER — DEXTROSE-NACL 5-0.45 % IV SOLN
INTRAVENOUS | Status: DC
  Administered 2014-06-13 – 2014-06-15 (×4): via INTRAVENOUS

## 2014-06-13 MED ORDER — HEPARIN SODIUM (PORCINE) 5000 UNIT/ML IJ SOLN
5000.0000 [IU] | Freq: Three times a day (TID) | INTRAMUSCULAR | Status: DC
  Administered 2014-06-13 – 2014-06-18 (×13): 5000 [IU] via SUBCUTANEOUS
  Filled 2014-06-13 (×17): qty 1

## 2014-06-13 NOTE — H&P (Signed)
Date: 06/13/2014               Patient Name:  Alicia [name] MRN: [id number]  DOB: [birth year] Age / Sex: 80 y.o., female   PCP: Shellia Carwin, PA-C         Medical Service: Internal Medicine Teaching Service         Attending Physician: Dr. Michel Bickers, MD    First Contact: Dr. Marvel Plan Pager: [phone]  Second Contact: Dr. Heber [facility] Pager: [phone]       After Hours (After 5p/  First Contact Pager: [phone]  weekends / holidays): Second Contact Pager: [phone]   Chief Complaint: vomitting  History of Present Illness:   80 yo female with CAD, HTN, HLD, Parkinsons dementia, Anxiety, depression, diastolic CHF1 recently discharged on 06/12/14 admitted for n/v and chest pain/epigastric pain, thought to be 2/2 to GERD. Didn't have any n/v on discharge. Today comes back for vomitting since discharge yesterday. Threw up after eating continuously until emesis became green. Started vomitting today morning too after eating.   CT abdomen was done in the ED showing small bowel dilation with transition to decompressed small bowel in RLQ likely partial SBO. Sliding hiatal hernia, bibasilar atelectasis. CXR negative.   Patient had 1 solid BM in the ED and 1 loose stool nonbloody. Admits to having flatus. Denies any abdominal pain to me but did have some according to ED note. Denies any fever/chills/SOB/cp.   History very limited due to patient's dementia. Got more info from daughter in law Emesis 3x since last night. Threw up what she ate and also continued to throw up last night and this AM.   Meds: Current Facility-Administered Medications  Medication Dose Route Frequency Provider Last Rate Last Dose  . 0.9 % NaCl with KCl 20 mEq/ L  infusion   Intravenous Continuous Megan N Dort, PA-C      . acetaminophen (TYLENOL) suppository 650 mg  650 mg Rectal Q6H PRN Megan N Dort, PA-C      . Barium Sulfate 2.1 % SUSP 900 mL  900 mL Oral Once Medication Radiologist, MD      . fentaNYL (SUBLIMAZE)  injection 12.5-50 mcg  12.5-50 mcg Intravenous Q2H PRN Megan N Dort, PA-C      . lidocaine (XYLOCAINE) 2 % viscous mouth solution 15 mL  15 mL Mouth/Throat Once Renne Musca, MD      . ondansetron [facility]) injection 4 mg  4 mg Intravenous Q6H PRN Megan N Dort, PA-C      . pantoprazole (PROTONIX) EC tablet 40 mg  40 mg Oral Q1200 [place], PA-C       Current Outpatient Prescriptions  Medication Sig Dispense Refill  . Albuterol Sulfate (PROAIR RESPICLICK) 277 (90 BASE) MCG/ACT AEPB Inhale 2 puffs into the lungs every 4 (four) hours as needed (for shortness of breath).       Marland Kitchen amLODipine (NORVASC) 2.5 MG tablet Take 2.5 mg by mouth daily.      Marland Kitchen aspirin EC 81 MG tablet Take 81 mg by mouth daily.      Marland Kitchen atorvastatin (LIPITOR) 20 MG tablet Take 20 mg by mouth at bedtime.       . calcium carbonate (OS-CAL) 600 MG TABS tablet Take 600 mg by mouth at bedtime.      . famotidine (PEPCID) 20 MG tablet Take 1 tablet (20 mg total) by mouth 2 (two) times daily.  10 tablet  0  . loratadine (CLARITIN) 10 MG tablet Take  10 mg by mouth as needed for allergies.       . Memantine HCl ER (NAMENDA XR) 28 MG CP24 Take 28 mg by mouth daily.  90 capsule  3  . Multiple Vitamin (MULTIVITAMIN WITH MINERALS) TABS Take 1 tablet by mouth daily.      Marland Kitchen oxybutynin (DITROPAN-XL) 10 MG 24 hr tablet Take 10 mg by mouth daily.      . polyethylene glycol (MIRALAX / GLYCOLAX) packet Take 17 g by mouth daily as needed for mild constipation. With 8 oz of liquid daily.      . promethazine (PHENERGAN) 12.5 MG tablet Take 1 tablet (12.5 mg total) by mouth every 8 (eight) hours as needed for nausea or vomiting.  30 tablet  0  . sertraline (ZOLOFT) 50 MG tablet Take 50 mg by mouth daily.        Allergies: Allergies as of 06/13/2014 - Review Complete 06/13/2014  Allergen Reaction Noted  . Iohexol Rash 03/12/2013  . Ativan [lorazepam] Other (See Comments) 06/12/2014  . Ciprofloxacin Other (See Comments) 07/15/2013  . Nsaids Other  (See Comments) 07/15/2013  . Sulfa antibiotics Hives and Itching 05/20/2012  . Ivp dye [iodinated diagnostic agents] Rash 07/15/2013   Past Medical History  Diagnosis Date  . Dementia     significant  . Renal disease   . Memory loss 12/10/2012  . Depression 12/10/2012  . Anxiety state, unspecified 12/10/2012  . Spells 12/10/2012  . Essential hypertension, benign 12/10/2012  . Other and unspecified hyperlipidemia 12/10/2012  . Loss of weight 12/10/2012  . Carotid artery stenosis   . Ovarian cancer     treated with surgery & chemo  . Ankle fracture, right   . Unstable gait     mild  . Syncope 12/10/2012    Recurrent, sporadic episodes  . Carotid artery disease   . Parkinson disease   . Diabetes mellitus without complication    Past Surgical History  Procedure Laterality Date  . Vesicovaginal fistula closure w/ tah  2005  . Oophorectomy  2007    for ovarian cancer  . Prolapsed uterine fibroid ligation    . Appendectomy    . Cataract extraction Bilateral   . Implantable loop recorder  11/14    MDT LINQ implanted for recurrent unexplained syncope by Dr Rayann Heman  . Abdominal hysterectomy    . Ankle surgery Left    Family History  Problem Relation Age of Onset  . Cancer Mother     breast  . Stroke Father    History   Social History  . Marital Status: Married    Spouse Name: N/A    Number of Children: N/A  . Years of Education: N/A   Occupational History  . Not on file.   Social History Main Topics  . Smoking status: Never Smoker   . Smokeless tobacco: Never Used  . Alcohol Use: No  . Drug Use: No  . Sexual Activity: No   Other Topics Concern  . Not on file   Social History Narrative   Lives with son and daughter in law    Review of Systems: Review of Systems  Constitutional: Negative for fever, chills, malaise/fatigue and diaphoresis.  HENT: Negative for congestion, ear pain, hearing loss, nosebleeds, sore throat and tinnitus.   Eyes: Negative for blurred  vision, photophobia, pain and redness.  Respiratory: Negative for cough, hemoptysis, sputum production, shortness of breath and wheezing.   Cardiovascular: Negative for chest pain, palpitations, orthopnea, claudication, leg swelling  and PND.  Gastrointestinal: Positive for nausea and vomiting. Negative for heartburn, abdominal pain and blood in stool.  Genitourinary: Negative.   Musculoskeletal: Negative.   Skin: Negative.   Neurological: Negative.  Negative for weakness and headaches.  Endo/Heme/Allergies: Negative.   Psychiatric/Behavioral: Positive for memory loss.     Physical Exam: Blood pressure 148/73, pulse 89, temperature 99.5 F (37.5 C), temperature source Rectal, resp. rate 17, SpO2 96.00%.  Physical Exam  Constitutional: She appears cachectic.  Non-toxic appearance. She does not have a sickly appearance. She does not appear ill. No distress.  Oriented to self only. pleasantly demented.   HENT:  Head: Normocephalic and atraumatic.  Eyes: Conjunctivae and EOM are normal. Pupils are equal, round, and reactive to light. Right eye exhibits no discharge. Left eye exhibits no discharge. No scleral icterus.  Neck: Normal range of motion. Neck supple. No JVD present. No tracheal deviation present. No thyromegaly present.  Cardiovascular: Normal rate and regular rhythm.   Murmur heard. Respiratory: Effort normal and breath sounds normal. No stridor.  GI: Soft. She exhibits distension. She exhibits no mass. There is no tenderness. There is no rebound and no guarding.  Bowel sound hypoactive. Some distension but no TTP. No mass.   Musculoskeletal: Normal range of motion. She exhibits no edema and no tenderness.  Lymphadenopathy:    She has no cervical adenopathy.  Neurological: She is alert. She displays no atrophy and no tremor. No cranial nerve deficit or sensory deficit. She exhibits normal muscle tone. She displays no seizure activity. GCS eye subscore is 4. GCS verbal subscore  is 5. GCS motor subscore is 6.  Skin: Skin is warm and dry.    Lab results: Basic Metabolic Panel:  Recent Labs  06/12/14 0123 06/13/14 0920  NA 138 133*  K 4.2 4.7  CL 96 96  CO2 26 21  GLUCOSE 131* 193*  BUN 31* 38*  CREATININE 1.26* 1.51*  CALCIUM 9.4 9.0   Liver Function Tests:  Recent Labs  06/12/14 0123 06/13/14 0920  AST 21 20  ALT 15 15  ALKPHOS 84 85  BILITOT 0.3 0.4  PROT 7.0 7.2  ALBUMIN 3.4* 3.3*    Recent Labs  06/13/14 0920  LIPASE 21   No results found for this basename: AMMONIA,  in the last 72 hours CBC:  Recent Labs  06/11/14 1142 06/13/14 0920  WBC 8.5 6.6  NEUTROABS  --  5.0  HGB 13.3 14.0  HCT 40.4 41.6  MCV 95.1 93.1  PLT 184 223   Cardiac Enzymes:  Recent Labs  06/11/14 1920 06/12/14 0123  TROPONINI <0.30 <0.30   BNP:  Recent Labs  06/11/14 1142  PROBNP 390.1   D-Dimer: No results found for this basename: DDIMER,  in the last 72 hours CBG: No results found for this basename: GLUCAP,  in the last 72 hours Hemoglobin A1C: No results found for this basename: HGBA1C,  in the last 72 hours Fasting Lipid Panel: No results found for this basename: CHOL, HDL, LDLCALC, TRIG, CHOLHDL, LDLDIRECT,  in the last 72 hours Thyroid Function Tests: No results found for this basename: TSH, T4TOTAL, FREET4, T3FREE, THYROIDAB,  in the last 72 hours Anemia Panel: No results found for this basename: VITAMINB12, FOLATE, FERRITIN, TIBC, IRON, RETICCTPCT,  in the last 72 hours Coagulation: No results found for this basename: LABPROT, INR,  in the last 72 hours Urine Drug Screen: Drugs of Abuse  No results found for this basename: labopia,  cocainscrnur,  labbenz,  amphetmu,  thcu,  labbarb    Alcohol Level: No results found for this basename: ETH,  in the last 72 hours Urinalysis:  Recent Labs  06/13/14 1048  COLORURINE YELLOW  LABSPEC 1.025  PHURINE 5.0  GLUCOSEU NEGATIVE  HGBUR NEGATIVE  BILIRUBINUR NEGATIVE  KETONESUR  15*  PROTEINUR NEGATIVE  UROBILINOGEN 0.2  NITRITE NEGATIVE  Saline. Labs: Lipase negative.  Imaging results:  Ct Abdomen Pelvis Wo Contrast  06/13/2014   CLINICAL DATA:  Acute onset nausea, vomiting, and diarrhea beginning yesterday. Intermittent epigastric pain, waxing and waning.  EXAM: CT ABDOMEN AND PELVIS WITHOUT CONTRAST  TECHNIQUE: Multidetector CT imaging of the abdomen and pelvis was performed following the standard protocol without IV contrast.  COMPARISON:  12/18/2003  FINDINGS: There is a focal region of opacity in the right lower lobe extending to the pleura, new from prior. Minimal opacity is present in the left lung base in the lower lobe and lingula, likely atelectasis. There is no pleural effusion. Extensive 3 vessel coronary artery calcification is noted. There is a moderate-sized sliding hiatal hernia. Oral contrast is partially visualized in the distal esophagus.  The liver, gallbladder, spleen, adrenal glands, kidneys, and pancreas have an unremarkable unenhanced appearance.  There is prominent distention of the stomach by oral contrast and retained ingested material. There is colonic diverticulosis without evidence of diverticulitis. The colon is filled with a small-to-moderate amount of liquid stool throughout. Oral contrast is present in multiple mildly dilated loops of small bowel measuring up to approximately 3.7 cm in diameter. The distal ileum is decompressed, and there is a transition from mildly dilated to decompressed small bowel in the right lower quadrant (series 2, image 44). There is mild, diffuse stranding throughout the root of the mesentery.  Multiple surgical clips are now present in the retroperitoneum and pelvis. The uterus is absent. Bladder is grossly unremarkable. Advanced aortoiliac atherosclerotic calcification is present. No enlarged lymph nodes are identified. No free fluid or fluid collections are identified. Stranding is noted along  the lower anterior abdominal wall and may be related to prior surgery. Moderate thoracolumbar spondylosis is noted.  IMPRESSION: 1. Small bowel dilatation with transition to decompressed small bowel in the right lower quadrant, concerning for partial small bowel obstruction. 2. Sliding hiatal hernia. Contrast in the distal esophagus may reflect gastroesophageal reflux or esophageal dysmotility. 3. Bibasilar atelectasis in the lungs. Superimposed infection is not excluded in the right lower lobe.   Electronically Signed   By: Logan Bores   On: 06/13/2014 14:41   Dg Chest Portable 1 View  06/13/2014   CLINICAL DATA:  80 year old female with nausea, vomiting, diarrhea  EXAM: PORTABLE CHEST - 1 VIEW  COMPARISON:  06/11/2014  FINDINGS: Cardiomediastinal silhouette projects unchanged in size and contour. No confluent airspace disease, pneumothorax, or pleural effusion.  Atherosclerotic calcifications of the aortic arch.  Electronic loop recorder on the left chest wall. This is unchanged from prior.  No displaced fracture.  Unremarkable appearance of the upper abdomen.  IMPRESSION: No radiographic evidence of acute cardiopulmonary disease.  Atherosclerosis.  Signed,  Dulcy Fanny. Earleen Newport, DO  [facility]  [facility]   Electronically Signed   By: Corrie Mckusick D.O.   On: 06/13/2014 09:45    Other results:  Assessment & Plan by Problem: Principal Problem:   Partial small bowel obstruction Active Problems:   Essential hypertension, benign   Dementia   GERD (gastroesophageal reflux disease)   Sliding hiatal  hernia   Diabetes mellitus without complication   Chronic kidney disease (CKD), stage IV (severe)   Hyponatremia 80 yo female with dementia, recently admitted for CP rule out thought to be from GERD, back with n/v   SBO - seen on CT abdomen, had previous abx sx including appendectomy, hysterectomy.  with nausea/vomitting.  -medical management for now. Surgery  team consulted and on board. Will follow along in case she needs ex lap- hopefully not. - NGT decompression, IVF, NPO - IV phenergan low dose for nausea. Bowel rest. - no pain currently so doesn't need any pain medication for now.  - Xray to confirm NGT placement - CXR tomorrow to make sure she doesn't develop PNA. Watch out for fevers as she had atelectasis on CT abdomen.  Diarrhea- 1x in ED-checking c.diff.  AKI on CKD - baseline around ~1.06-1.15. - likely pre-renal 2/2 to emesis. Continue to monitor with IVF.  GERD with sliding hiatal hernia- IV protonix 40mg  qdaily   HTN  -hold home amlodopine since patient is NPO. BP wnl here ~140's.   Parkinson's disease with dementia -restart home memantine 28mg  daily when not NPO.  DVT/PE ppx: heparin.  Dispo: Disposition is deferred at this time, awaiting improvement of current medical problems. Anticipated discharge in approximately 2-3day(s).   The patient does have a current PCP (Lucy Maryan Puls, PA-C) and does need an [facility] hospital follow-up appointment after discharge.  The patient does have transportation limitations that hinder transportation to clinic appointments.  Signed: Dellia Nims, MD 06/13/2014, 4:53 PM

## 2014-06-13 NOTE — ED Notes (Signed)
Family at bedside. 

## 2014-06-13 NOTE — ED Notes (Signed)
MD at bedside. 

## 2014-06-13 NOTE — Progress Notes (Signed)
[facility] Surgery Consult Note  Alicia [name] [birth year]  [id number].    Requesting MD: Dr.  Salon Chief Complaint/Reason for Consult: pSBO  HPI:  Most of history obtained from the chart/nurses because the family is not at bedside and the patient is unable to give any details about her history at this time.  80 y/o white female with CAD, HTN, HLD, parkinsons dementia, anxiety, depression, diastolic CHF1 recently discharged on 06/12/14 admitted for chest pain/epigastric pain, thought to be 2/2 to GERD. Didn't have any n/v on discharge. Reportedly her family brought her to [facility] for nausea, vomiting, weakness, anorexia since discharge on 06/12/14.  Reportedly vomited food contents and output became bilious.  Attempted to eat this am and was not able to keep anything down.  Her abdominal pain started last night and 2 episodes of vomiting.  Not able to tell me if she has radiating pain or precipitating/alleviating factors.  Denies any pain currently 0/10.  Says she's no longer nauseated.  Had 1 small soft BM followed by a large loose BM "blow out" in the ED per nursing.  No blood per rectum.  CT abdomen was done in the ED showing small bowel dilation with transition to decompressed small bowel in RLQ likely partial SBO. Sliding hiatal hernia, bibasilar atelectasis. CXR negative.  WBC normal.  Creatinine elevated at 1.51.     ROS: All systems reviewed and otherwise negative except for as above  Family History  Problem Relation Age of Onset  . Cancer Mother     breast  . Stroke Father     Past Medical History  Diagnosis Date  . Dementia     significant  . Renal disease   . Memory loss 12/10/2012  . Depression 12/10/2012  . Anxiety state, unspecified 12/10/2012  . Spells 12/10/2012  . Essential hypertension, benign 12/10/2012  . Other and unspecified hyperlipidemia 12/10/2012  . Loss of weight 12/10/2012  . Carotid artery stenosis   . Ovarian cancer     treated with surgery &  chemo  . Ankle fracture, right   . Unstable gait     mild  . Syncope 12/10/2012    Recurrent, sporadic episodes  . Carotid artery disease   . Parkinson disease   . Diabetes mellitus without complication     Past Surgical History  Procedure Laterality Date  . Vesicovaginal fistula closure w/ tah  2005  . Oophorectomy  2007    for ovarian cancer  . Prolapsed uterine fibroid ligation    . Appendectomy    . Cataract extraction Bilateral   . Implantable loop recorder  11/14    MDT LINQ implanted for recurrent unexplained syncope by Dr Rayann Heman  . Abdominal hysterectomy    . Ankle surgery Left     Social History:  reports that she has never smoked. She has never used smokeless tobacco. She reports that she does not drink alcohol or use illicit drugs.  Allergies:  Allergies  Allergen Reactions  . Iohexol Rash  . Ativan [Lorazepam] Other (See Comments)    MAKES HER AGITATED  . Ciprofloxacin Other (See Comments)    weakness  . Nsaids Other (See Comments)    Renal insufficiency  . Sulfa Antibiotics Hives and Itching  . Ivp Dye [Iodinated Diagnostic Agents] Rash     (Not in a hospital admission)  Blood pressure 148/73, pulse 89, temperature 99.5 F (37.5 C), temperature source Rectal, resp. rate 17, SpO2 96.00%. Physical Exam: General: pleasant, WD/WN white female who is  laying in bed in NAD HEENT: head is normocephalic, atraumatic.  Sclera are noninjected.  PERRL.  Ears and nose without any masses or lesions.  Mouth is pink and moist Heart: regular, rate, and rhythm.  No obvious murmurs, gallops, or rubs noted.  Palpable pedal pulses bilaterally Lungs: CTAB, no wheezes, rhonchi, or rales noted.  Respiratory effort nonlabored Abd: soft, distended, tender mildly throughout, diminished BS, no masses, hernias, or organomegaly, lower midline scar noted which is well healed, no other scars noted MS: all 4 extremities are symmetrical with no cyanosis, clubbing, or edema Skin: warm  and dry with no masses, lesions, or rashes Psych: A&Ox3 with an appropriate affect, demented, confused   Results for orders placed during the hospital encounter of 06/13/14 (from the past 48 hour(s))  I-STAT TROPOININ, ED     Status: None   Collection Time    06/13/14  9:10 AM      Result Value Ref Range   Troponin i, poc 0.03  0.00 - 0.08 ng/mL   Comment 3            Comment: Due to the release kinetics of cTnI,     a negative result within the first hours     of the onset of symptoms does not rule out     myocardial infarction with certainty.     If myocardial infarction is still suspected,     repeat the test at appropriate intervals.  CBC WITH DIFFERENTIAL     Status: None   Collection Time    06/13/14  9:20 AM      Result Value Ref Range   WBC 6.6  4.0 - 10.5 K/uL   RBC 4.47  3.87 - 5.11 MIL/uL   Hemoglobin 14.0  12.0 - 15.0 g/dL   HCT 41.6  36.0 - 46.0 %   MCV 93.1  78.0 - 100.0 fL   MCH 31.3  26.0 - 34.0 pg   MCHC 33.7  30.0 - 36.0 g/dL   RDW 14.1  11.5 - 15.5 %   Platelets 223  150 - 400 K/uL   Neutrophils Relative % 75  43 - 77 %   Neutro Abs 5.0  1.7 - 7.7 K/uL   Lymphocytes Relative 17  12 - 46 %   Lymphs Abs 1.1  0.7 - 4.0 K/uL   Monocytes Relative 8  3 - 12 %   Monocytes Absolute 0.6  0.1 - 1.0 K/uL   Eosinophils Relative 0  0 - 5 %   Eosinophils Absolute 0.0  0.0 - 0.7 K/uL   Basophils Relative 0  0 - 1 %   Basophils Absolute 0.0  0.0 - 0.1 K/uL  COMPREHENSIVE METABOLIC PANEL     Status: Abnormal   Collection Time    06/13/14  9:20 AM      Result Value Ref Range   Sodium 133 (*) 137 - 147 mEq/L   Potassium 4.7  3.7 - 5.3 mEq/L   Chloride 96  96 - 112 mEq/L   CO2 21  19 - 32 mEq/L   Glucose, Bld 193 (*) 70 - 99 mg/dL   BUN 38 (*) 6 - 23 mg/dL   Creatinine, Ser 1.51 (*) 0.50 - 1.10 mg/dL   Calcium 9.0  8.4 - 10.5 mg/dL   Total Protein 7.2  6.0 - 8.3 g/dL   Albumin 3.3 (*) 3.5 - 5.2 g/dL   AST 20  0 - 37 U/L   ALT 15  0 -  35 U/L   Alkaline  Phosphatase 85  39 - 117 U/L   Total Bilirubin 0.4  0.3 - 1.2 mg/dL   GFR calc non Af Amer 30 (*) >90 mL/min   GFR calc Af Amer 34 (*) >90 mL/min   Comment: (NOTE)     The eGFR has been calculated using the CKD EPI equation.     This calculation has not been validated in all clinical situations.     eGFR's persistently <90 mL/min signify possible Chronic Kidney     Disease.   Anion gap 16 (*) 5 - 15  LIPASE, BLOOD     Status: None   Collection Time    06/13/14  9:20 AM      Result Value Ref Range   Lipase 21  11 - 59 U/L  URINALYSIS, ROUTINE W REFLEX MICROSCOPIC     Status: Abnormal   Collection Time    06/13/14 10:48 AM      Result Value Ref Range   Color, Urine YELLOW  YELLOW   Comment: LESS THAN 10 mL OF URINE SUBMITTED   APPearance CLEAR  CLEAR   Specific Gravity, Urine 1.025  1.005 - 1.030   pH 5.0  5.0 - 8.0   Glucose, UA NEGATIVE  NEGATIVE mg/dL   Hgb urine dipstick NEGATIVE  NEGATIVE   Bilirubin Urine NEGATIVE  NEGATIVE   Ketones, ur 15 (*) NEGATIVE mg/dL   Protein, ur NEGATIVE  NEGATIVE mg/dL   Urobilinogen, UA 0.2  0.0 - 1.0 mg/dL   Nitrite NEGATIVE  NEGATIVE   Leukocytes, UA NEGATIVE  NEGATIVE   Comment: MICROSCOPIC NOT DONE ON URINES WITH NEGATIVE PROTEIN, BLOOD, LEUKOCYTES, NITRITE, OR GLUCOSE <1000 mg/dL.   Ct Abdomen Pelvis Wo Contrast  06/13/2014   CLINICAL DATA:  Acute onset nausea, vomiting, and diarrhea beginning yesterday. Intermittent epigastric pain, waxing and waning.  EXAM: CT ABDOMEN AND PELVIS WITHOUT CONTRAST  TECHNIQUE: Multidetector CT imaging of the abdomen and pelvis was performed following the standard protocol without IV contrast.  COMPARISON:  12/18/2003  FINDINGS: There is a focal region of opacity in the right lower lobe extending to the pleura, new from prior. Minimal opacity is present in the left lung base in the lower lobe and lingula, likely atelectasis. There is no pleural effusion. Extensive 3 vessel coronary artery calcification is noted.  There is a moderate-sized sliding hiatal hernia. Oral contrast is partially visualized in the distal esophagus.  The liver, gallbladder, spleen, adrenal glands, kidneys, and pancreas have an unremarkable unenhanced appearance.  There is prominent distention of the stomach by oral contrast and retained ingested material. There is colonic diverticulosis without evidence of diverticulitis. The colon is filled with a small-to-moderate amount of liquid stool throughout. Oral contrast is present in multiple mildly dilated loops of small bowel measuring up to approximately 3.7 cm in diameter. The distal ileum is decompressed, and there is a transition from mildly dilated to decompressed small bowel in the right lower quadrant (series 2, image 44). There is mild, diffuse stranding throughout the root of the mesentery.  Multiple surgical clips are now present in the retroperitoneum and pelvis. The uterus is absent. Bladder is grossly unremarkable. Advanced aortoiliac atherosclerotic calcification is present. No enlarged lymph nodes are identified. No free fluid or fluid collections are identified. Stranding is noted along the lower anterior abdominal wall and may be related to prior surgery. Moderate thoracolumbar spondylosis is noted.  IMPRESSION: 1. Small bowel dilatation with transition to decompressed small bowel in  the right lower quadrant, concerning for partial small bowel obstruction. 2. Sliding hiatal hernia. Contrast in the distal esophagus may reflect gastroesophageal reflux or esophageal dysmotility. 3. Bibasilar atelectasis in the lungs. Superimposed infection is not excluded in the right lower lobe.   Electronically Signed   By: Logan Bores   On: 06/13/2014 14:41   Dg Chest Portable 1 View  06/13/2014   CLINICAL DATA:  80 year old female with nausea, vomiting, diarrhea  EXAM: PORTABLE CHEST - 1 VIEW  COMPARISON:  06/11/2014  FINDINGS: Cardiomediastinal silhouette projects unchanged in size and contour. No  confluent airspace disease, pneumothorax, or pleural effusion.  Atherosclerotic calcifications of the aortic arch.  Electronic loop recorder on the left chest wall. This is unchanged from prior.  No displaced fracture.  Unremarkable appearance of the upper abdomen.  IMPRESSION: No radiographic evidence of acute cardiopulmonary disease.  Atherosclerosis.  Signed,  Dulcy Fanny. Earleen Newport, DO  [facility]  [facility]   Electronically Signed   By: Corrie Mckusick D.O.   On: 06/13/2014 09:45      Assessment/Plan pSBO Multiple medical problems  Plan: 1.  Admit to Medicine, we will consult 2.  Conservative management for now.  NG tube, NPO, bowel rest, IVF, pain control, antiemetics 3.  SCD's 4.  Ambulate and IS as able 5.  She has had 1 large blow out and one smaller BM since arrival to the ED, check c.diff.  Hopefully she will continue to improve.  Will place NG because her stomach is greatly distended on CT and full of food material/contrast.  Hopefully she will not require ex lap.  We will see how she progresses.   Coralie Keens, [facility] Surgery 06/13/2014, 4:01 PM Pager: [phone]

## 2014-06-13 NOTE — ED Notes (Signed)
Here for n/v. She was just here this past Wednesday for n/v.  Family states, "prone to aspiration."  Pt. Is weak, prone to uti's. According to family, normal urine color and no odor. Temp at home 101 F. Lives at home with family.  Hx. Of dementia. No cp and no sob.

## 2014-06-13 NOTE — ED Provider Notes (Signed)
I saw and evaluated the patient, reviewed the resident's note and I agree with the findings and plan.   EKG Interpretation   Date/Time:  Friday June 13 2014 08:53:29 EDT Ventricular Rate:  86 PR Interval:  162 QRS Duration: 123 QT Interval:  410 QTC Calculation: 490 R Axis:   95 Text Interpretation:  Sinus rhythm Left atrial enlargement RBBB and LPFB  Minimal ST elevation, inferior leads agree. no stemi.dynamic T wave  anterior. Confirmed by Johnney Killian, MD, Jeannie Done [phone]) on 06/13/2014 9:03:11 AM       Charlesetta Shanks, MD 06/13/14 [phone]

## 2014-06-13 NOTE — ED Provider Notes (Signed)
CSN: [id number]     Arrival date & time 06/13/14  0831 History   First MD Initiated Contact with Patient 06/13/14 0848     Chief Complaint  Patient presents with  . Diarrhea  . Nausea  . Morning Sickness     (Consider location/radiation/quality/duration/timing/severity/associated sxs/prior Treatment) Patient is a 80 y.o. female presenting with vomiting. The history is provided by a relative.  Emesis Severity:  Moderate Duration:  2 days Timing:  Intermittent Number of daily episodes:  1 Quality:  Undigested food Able to tolerate:  Liquids and solids Progression:  Worsening Chronicity:  New Recent urination:  Normal Context: not self-induced   Relieved by:  None tried Worsened by:  Nothing tried Ineffective treatments:  None tried Associated symptoms: abdominal pain and fever   Associated symptoms: no diarrhea   Abdominal pain:    Location:  Epigastric   Quality:  Unable to specify   Severity:  Unable to specify   Duration:  2 days   Chronicity:  New Fever:    Max temp PTA (F):  101 Risk factors: prior abdominal surgery   Risk factors: no sick contacts     Past Medical History  Diagnosis Date  . Dementia     significant  . Renal disease   . Memory loss 12/10/2012  . Depression 12/10/2012  . Anxiety state, unspecified 12/10/2012  . Spells 12/10/2012  . Essential hypertension, benign 12/10/2012  . Other and unspecified hyperlipidemia 12/10/2012  . Loss of weight 12/10/2012  . Carotid artery stenosis   . Ovarian cancer     treated with surgery & chemo  . Ankle fracture, right   . Unstable gait     mild  . Syncope 12/10/2012    Recurrent, sporadic episodes  . Carotid artery disease   . Parkinson disease   . Diabetes mellitus without complication    Past Surgical History  Procedure Laterality Date  . Vesicovaginal fistula closure w/ tah  2005  . Oophorectomy  2007    for ovarian cancer  . Prolapsed uterine fibroid ligation    . Appendectomy    . Cataract  extraction Bilateral   . Implantable loop recorder  11/14    MDT LINQ implanted for recurrent unexplained syncope by Dr Rayann Heman  . Abdominal hysterectomy    . Ankle surgery Left    Family History  Problem Relation Age of Onset  . Cancer Mother     breast  . Stroke Father    History  Substance Use Topics  . Smoking status: Never Smoker   . Smokeless tobacco: Never Used  . Alcohol Use: No   OB History   Grav Para Term Preterm Abortions TAB SAB Ect Mult Living                 Review of Systems  Unable to perform ROS: Dementia  Gastrointestinal: Positive for vomiting and abdominal pain. Negative for diarrhea.      Allergies  Iohexol; Ativan; Ciprofloxacin; Nsaids; Sulfa antibiotics; and Ivp dye  Home Medications   Prior to Admission medications   Medication Sig Start Date End Date Taking? Authorizing Provider  Albuterol Sulfate (PROAIR RESPICLICK) 211 (90 BASE) MCG/ACT AEPB Inhale 2 puffs into the lungs every 4 (four) hours as needed (for shortness of breath).  05/08/14  Yes Historical Provider, MD  amLODipine (NORVASC) 2.5 MG tablet Take 2.5 mg by mouth daily.   Yes Historical Provider, MD  aspirin EC 81 MG tablet Take 81 mg by mouth daily.  Yes Historical Provider, MD  atorvastatin (LIPITOR) 20 MG tablet Take 20 mg by mouth at bedtime.    Yes Historical Provider, MD  calcium carbonate (OS-CAL) 600 MG TABS tablet Take 600 mg by mouth at bedtime.   Yes Historical Provider, MD  famotidine (PEPCID) 20 MG tablet Take 1 tablet (20 mg total) by mouth 2 (two) times daily. 05/29/14  Yes Charlesetta Shanks, MD  loratadine (CLARITIN) 10 MG tablet Take 10 mg by mouth as needed for allergies.  11/30/13  Yes Historical Provider, MD  Memantine HCl ER (NAMENDA XR) 28 MG CP24 Take 28 mg by mouth daily. 04/02/14  Yes Star Age, MD  Multiple Vitamin (MULTIVITAMIN WITH MINERALS) TABS Take 1 tablet by mouth daily.   Yes Historical Provider, MD  oxybutynin (DITROPAN-XL) 10 MG 24 hr tablet Take 10 mg  by mouth daily.   Yes Historical Provider, MD  polyethylene glycol (MIRALAX / GLYCOLAX) packet Take 17 g by mouth daily as needed for mild constipation. With 8 oz of liquid daily. 12/24/13  Yes Eugenie Filler, MD  promethazine (PHENERGAN) 12.5 MG tablet Take 1 tablet (12.5 mg total) by mouth every 8 (eight) hours as needed for nausea or vomiting. 06/12/14  Yes Alexa Marvel Plan, MD  sertraline (ZOLOFT) 50 MG tablet Take 50 mg by mouth daily.   Yes Historical Provider, MD   BP 144/67  Pulse 88  Temp(Src) 97.8 F (36.6 C) (Oral)  SpO2 97% Physical Exam  Vitals reviewed. Constitutional: She appears well-developed and well-nourished. No distress.  HENT:  Head: Normocephalic and atraumatic.  Mouth/Throat: Mucous membranes are dry.  Eyes: EOM are normal.  Neck: Normal range of motion.  Cardiovascular: Normal rate and regular rhythm.   Murmur heard.  Systolic murmur is present with a grade of 3/6  Pulmonary/Chest: Effort normal and breath sounds normal. No respiratory distress. She has no wheezes.  Abdominal: Soft. Bowel sounds are normal. She exhibits no distension. There is no tenderness. There is no rebound and no guarding.  Musculoskeletal: She exhibits no edema.  Neurological: She is alert.  Skin: She is not diaphoretic.    ED Course  Procedures (including critical care time) Labs Review Labs Reviewed  COMPREHENSIVE METABOLIC PANEL - Abnormal; Notable for the following:    Sodium 133 (*)    Glucose, Bld 193 (*)    BUN 38 (*)    Creatinine, Ser 1.51 (*)    Albumin 3.3 (*)    GFR calc non Af Amer 30 (*)    GFR calc Af Amer 34 (*)    Anion gap 16 (*)    All other components within normal limits  URINALYSIS, ROUTINE W REFLEX MICROSCOPIC - Abnormal; Notable for the following:    Ketones, ur 15 (*)    All other components within normal limits  CBC WITH DIFFERENTIAL  LIPASE, BLOOD  I-STAT TROPOININ, ED    Imaging Review Ct Abdomen Pelvis Wo Contrast  06/13/2014   CLINICAL  DATA:  Acute onset nausea, vomiting, and diarrhea beginning yesterday. Intermittent epigastric pain, waxing and waning.  EXAM: CT ABDOMEN AND PELVIS WITHOUT CONTRAST  TECHNIQUE: Multidetector CT imaging of the abdomen and pelvis was performed following the standard protocol without IV contrast.  COMPARISON:  12/18/2003  FINDINGS: There is a focal region of opacity in the right lower lobe extending to the pleura, new from prior. Minimal opacity is present in the left lung base in the lower lobe and lingula, likely atelectasis. There is no pleural effusion. Extensive 3 vessel  coronary artery calcification is noted. There is a moderate-sized sliding hiatal hernia. Oral contrast is partially visualized in the distal esophagus.  The liver, gallbladder, spleen, adrenal glands, kidneys, and pancreas have an unremarkable unenhanced appearance.  There is prominent distention of the stomach by oral contrast and retained ingested material. There is colonic diverticulosis without evidence of diverticulitis. The colon is filled with a small-to-moderate amount of liquid stool throughout. Oral contrast is present in multiple mildly dilated loops of small bowel measuring up to approximately 3.7 cm in diameter. The distal ileum is decompressed, and there is a transition from mildly dilated to decompressed small bowel in the right lower quadrant (series 2, image 44). There is mild, diffuse stranding throughout the root of the mesentery.  Multiple surgical clips are now present in the retroperitoneum and pelvis. The uterus is absent. Bladder is grossly unremarkable. Advanced aortoiliac atherosclerotic calcification is present. No enlarged lymph nodes are identified. No free fluid or fluid collections are identified. Stranding is noted along the lower anterior abdominal wall and may be related to prior surgery. Moderate thoracolumbar spondylosis is noted.  IMPRESSION: 1. Small bowel dilatation with transition to decompressed small  bowel in the right lower quadrant, concerning for partial small bowel obstruction. 2. Sliding hiatal hernia. Contrast in the distal esophagus may reflect gastroesophageal reflux or esophageal dysmotility. 3. Bibasilar atelectasis in the lungs. Superimposed infection is not excluded in the right lower lobe.   Electronically Signed   By: Logan Bores   On: 06/13/2014 14:41   Dg Chest Portable 1 View  06/13/2014   CLINICAL DATA:  80 year old female with nausea, vomiting, diarrhea  EXAM: PORTABLE CHEST - 1 VIEW  COMPARISON:  06/11/2014  FINDINGS: Cardiomediastinal silhouette projects unchanged in size and contour. No confluent airspace disease, pneumothorax, or pleural effusion.  Atherosclerotic calcifications of the aortic arch.  Electronic loop recorder on the left chest wall. This is unchanged from prior.  No displaced fracture.  Unremarkable appearance of the upper abdomen.  IMPRESSION: No radiographic evidence of acute cardiopulmonary disease.  Atherosclerosis.  Signed,  Dulcy Fanny. Earleen Newport, DO  [facility]  [facility]   Electronically Signed   By: Corrie Mckusick D.O.   On: 06/13/2014 09:45     EKG Interpretation   Date/Time:  Friday June 13 2014 08:53:29 EDT Ventricular Rate:  86 PR Interval:  162 QRS Duration: 123 QT Interval:  410 QTC Calculation: 490 R Axis:   95 Text Interpretation:  Sinus rhythm Left atrial enlargement RBBB and LPFB  Minimal ST elevation, inferior leads agree. no stemi.dynamic T wave  anterior. Confirmed by Johnney Killian, MD, Jeannie Done [phone]) on 06/13/2014 9:03:11 AM      MDM   Final diagnoses:  Partial small bowel obstruction    Patient is an 80 year old female with history of dementia that was dropped off by her family for one week of nausea vomiting diarrhea. Patient was discharged yesterday with a diagnosis of GERD and fortunately on my interview the family was around and the patient is unaware why she is here. We will check  labs, chest x-ray, urinalysis and try and speak with family to address her concerns. I spoke with patient's daughter-in-law and her concerns are that the patient started complaining of abdominal pain last night and has had 2 episodes of emesis. The daughter-in-law states that this is very out of character for the patient. On reexam patient does not have tenderness to right upper quadrant, there is no Murphy's sign.  Patient's  workup thus far has been unremarkable and with the history provided by the daughter-in-law we will obtain a CT of the abdomen looking for potential pancreatic versus gallbladder versus SBO etiology.  Patient's CT significant for partial small bowel obstruction in. Will place in NG tube, consult surgery, admitted to medicine for bowel rest. Patient started on maintenance IV fluid.   Renne Musca, MD 06/13/14 1556

## 2014-06-13 NOTE — ED Provider Notes (Signed)
I saw and evaluated the patient, reviewed the resident's note and I agree with the findings and plan.   EKG Interpretation   Date/Time:  Friday June 13 2014 08:53:29 EDT Ventricular Rate:  86 PR Interval:  162 QRS Duration: 123 QT Interval:  410 QTC Calculation: 490 R Axis:   95 Text Interpretation:  Sinus rhythm Left atrial enlargement RBBB and LPFB  Minimal ST elevation, inferior leads agree. no stemi.dynamic T wave  anterior. Confirmed by Johnney Killian, MD, Jeannie Done [phone]) on 06/13/2014 9:03:11 AM     The patient doesn't give specific history, she is not endorsing pain acutely. The daughter/caregiver however reports that since the patient was discharged yesterday she has had several episodes of emesis. She was able to eat yesterday but then shortly after she vomited fairly aggressively for several minutes until her emesis had a green appearance. She went through the night and then this morning again ate, but began vomiting even while sitting at the table. She reports she's been variably complaining about some pain in her epigastrium and upper abdomen. It seems to be waxing and waning. She reports is atypical for her to express pain. The patient is pleasant awake and alert, but very limited historian. Her color is good she does not have acute respiratory distress. There is a questionable rail present in the left lung base. Heart is regular no appreciable murmur gallop. The abdomen is soft, with deep palpation at one point I suspect that some guarding in the right upper quadrant however with repeat palpation that was not reproducible. Lower strength are normal without any edema and excellent skin condition. Currently lab value show increasing hyperglycemia, with interim develop a mild hyponatremia, and a small amount ketones present in the urine. CT scan will be obtained to further assess for the epigastric symptoms, and then subsequent disposition made.  Charlesetta Shanks, MD 06/13/14 [phone]

## 2014-06-13 NOTE — Progress Notes (Signed)
Abdomen is soft with no appreciable tenderness. Liquid stool present in colon and had BM in ED as above. Patient is a poor historian. Patient examined and I agree with the assessment and plan  Georganna Skeans, MD, MPH, FACS Trauma: [phone] General Surgery: [phone]  06/13/2014 5:12 PM

## 2014-06-14 ENCOUNTER — Inpatient Hospital Stay (HOSPITAL_COMMUNITY): Payer: Medicare Other

## 2014-06-14 DIAGNOSIS — K449 Diaphragmatic hernia without obstruction or gangrene: Secondary | ICD-10-CM

## 2014-06-14 DIAGNOSIS — K219 Gastro-esophageal reflux disease without esophagitis: Secondary | ICD-10-CM

## 2014-06-14 DIAGNOSIS — E871 Hypo-osmolality and hyponatremia: Secondary | ICD-10-CM

## 2014-06-14 DIAGNOSIS — E119 Type 2 diabetes mellitus without complications: Secondary | ICD-10-CM

## 2014-06-14 DIAGNOSIS — N184 Chronic kidney disease, stage 4 (severe): Secondary | ICD-10-CM

## 2014-06-14 DIAGNOSIS — K5660 Unspecified intestinal obstruction: Secondary | ICD-10-CM

## 2014-06-14 DIAGNOSIS — I129 Hypertensive chronic kidney disease with stage 1 through stage 4 chronic kidney disease, or unspecified chronic kidney disease: Secondary | ICD-10-CM

## 2014-06-14 LAB — BASIC METABOLIC PANEL WITH GFR
Anion gap: 11 (ref 5–15)
BUN: 32 mg/dL — ABNORMAL HIGH (ref 6–23)
CO2: 23 meq/L (ref 19–32)
Calcium: 8 mg/dL — ABNORMAL LOW (ref 8.4–10.5)
Chloride: 102 meq/L (ref 96–112)
Creatinine, Ser: 1.23 mg/dL — ABNORMAL HIGH (ref 0.50–1.10)
GFR calc Af Amer: 44 mL/min — ABNORMAL LOW
GFR calc non Af Amer: 38 mL/min — ABNORMAL LOW
Glucose, Bld: 131 mg/dL — ABNORMAL HIGH (ref 70–99)
Potassium: 4.2 meq/L (ref 3.7–5.3)
Sodium: 136 meq/L — ABNORMAL LOW (ref 137–147)

## 2014-06-14 LAB — CLOSTRIDIUM DIFFICILE BY PCR: CDIFFPCR: NEGATIVE

## 2014-06-14 MED ORDER — PANTOPRAZOLE SODIUM 40 MG PO PACK
40.0000 mg | PACK | Freq: Every day | ORAL | Status: DC
Start: 2014-06-14 — End: 2014-06-15
  Administered 2014-06-14 – 2014-06-15 (×2): 40 mg
  Filled 2014-06-14 (×2): qty 20

## 2014-06-14 MED ORDER — CETYLPYRIDINIUM CHLORIDE 0.05 % MT LIQD
7.0000 mL | Freq: Two times a day (BID) | OROMUCOSAL | Status: DC
  Administered 2014-06-14 – 2014-06-18 (×8): 7 mL via OROMUCOSAL

## 2014-06-14 NOTE — Progress Notes (Signed)
2215 Pt Pulled out her NGtube for the third time this admission. Has mittens and sitter is at bedside. Dr Posey Pronto notified, said it's okay to leave it out tonight and they will reevaluate tomorrow am.

## 2014-06-14 NOTE — Progress Notes (Signed)
Subjective: Patient resting comfortably in bed, NGT in place, sitter at bedside.  Overnight events: patient pulled NGT out around 8pm, it was replaced and mittens placed on patient, she again pulled tube out and was replaced.  A sitter was ordered to bedside. Objective: Vital signs in last 24 hours: Filed Vitals:   06/13/14 2132 06/14/14 0151 06/14/14 0541 06/14/14 1040  BP: 153/57 152/86 146/62 163/63  Pulse: 88 87 84   Temp: 98.4 F (36.9 C) 98.2 F (36.8 C) 99.1 F (37.3 C) 99 F (37.2 C)  TempSrc: Oral Oral Axillary Oral  Resp: 18 18 18 18   Height:      Weight:      SpO2: 93% 97% 98%    Weight change:   Intake/Output Summary (Last 24 hours) at 06/14/14 1239 Last data filed at 06/14/14 0600  Gross per 24 hour  Intake 1171.67 ml  Output    450 ml  Net 721.67 ml   General: resting in bed NGT in place Cardiac: RRR Pulm: CTAB Abd: soft, nontender, hypoactive bowel sounds Ext: warm and well perfused, no pedal edema  Lab Results: Basic Metabolic Panel:  Recent Labs Lab 06/13/14 0920 06/14/14 0541  NA 133* 136*  K 4.7 4.2  CL 96 102  CO2 21 23  GLUCOSE 193* 131*  BUN 38* 32*  CREATININE 1.51* 1.23*  CALCIUM 9.0 8.0*   Liver Function Tests:  Recent Labs Lab 06/12/14 0123 06/13/14 0920  AST 21 20  ALT 15 15  ALKPHOS 84 85  BILITOT 0.3 0.4  PROT 7.0 7.2  ALBUMIN 3.4* 3.3*    Recent Labs Lab 06/13/14 0920  LIPASE 21   No results found for this basename: AMMONIA,  in the last 168 hours CBC:  Recent Labs Lab 06/11/14 1142 06/13/14 0920  WBC 8.5 6.6  NEUTROABS  --  5.0  HGB 13.3 14.0  HCT 40.4 41.6  MCV 95.1 93.1  PLT 184 223   Cardiac Enzymes:  Recent Labs Lab 06/11/14 1920 06/12/14 0123  TROPONINI <0.30 <0.30   BNP:  Recent Labs Lab 06/11/14 1142  PROBNP 390.1   D-Dimer: No results found for this basename: DDIMER,  in the last 168 hours CBG: No results found for this basename: GLUCAP,  in the last 168 hours Hemoglobin  A1C: No results found for this basename: HGBA1C,  in the last 168 hours Fasting Lipid Panel: No results found for this basename: CHOL, HDL, LDLCALC, TRIG, CHOLHDL, LDLDIRECT,  in the last 168 hours Thyroid Function Tests: No results found for this basename: TSH, T4TOTAL, FREET4, T3FREE, THYROIDAB,  in the last 168 hours Coagulation: No results found for this basename: LABPROT, INR,  in the last 168 hours Anemia Panel: No results found for this basename: VITAMINB12, FOLATE, FERRITIN, TIBC, IRON, RETICCTPCT,  in the last 168 hours Urine Drug Screen: Drugs of Abuse  No results found for this basename: labopia, cocainscrnur, labbenz, amphetmu, thcu, labbarb    Alcohol Level: No results found for this basename: ETH,  in the last 168 hours Urinalysis:  Recent Labs Lab 06/13/14 1048  COLORURINE YELLOW  LABSPEC 1.025  PHURINE 5.0  GLUCOSEU NEGATIVE  HGBUR NEGATIVE  BILIRUBINUR NEGATIVE  KETONESUR 15*  PROTEINUR NEGATIVE  UROBILINOGEN 0.2  NITRITE NEGATIVE  LEUKOCYTESUR NEGATIVE    Micro Results: Recent Results (from the past 240 hour(s))  CLOSTRIDIUM DIFFICILE BY PCR     Status: None   Collection Time    06/13/14  5:38 PM  Result Value Ref Range Status   C difficile by pcr NEGATIVE  NEGATIVE Final   Studies/Results: Ct Abdomen Pelvis Wo Contrast  06/13/2014   CLINICAL DATA:  Acute onset nausea, vomiting, and diarrhea beginning yesterday. Intermittent epigastric pain, waxing and waning.  EXAM: CT ABDOMEN AND PELVIS WITHOUT CONTRAST  TECHNIQUE: Multidetector CT imaging of the abdomen and pelvis was performed following the standard protocol without IV contrast.  COMPARISON:  12/18/2003  FINDINGS: There is a focal region of opacity in the right lower lobe extending to the pleura, new from prior. Minimal opacity is present in the left lung base in the lower lobe and lingula, likely atelectasis. There is no pleural effusion. Extensive 3 vessel coronary artery calcification is noted.  There is a moderate-sized sliding hiatal hernia. Oral contrast is partially visualized in the distal esophagus.  The liver, gallbladder, spleen, adrenal glands, kidneys, and pancreas have an unremarkable unenhanced appearance.  There is prominent distention of the stomach by oral contrast and retained ingested material. There is colonic diverticulosis without evidence of diverticulitis. The colon is filled with a small-to-moderate amount of liquid stool throughout. Oral contrast is present in multiple mildly dilated loops of small bowel measuring up to approximately 3.7 cm in diameter. The distal ileum is decompressed, and there is a transition from mildly dilated to decompressed small bowel in the right lower quadrant (series 2, image 44). There is mild, diffuse stranding throughout the root of the mesentery.  Multiple surgical clips are now present in the retroperitoneum and pelvis. The uterus is absent. Bladder is grossly unremarkable. Advanced aortoiliac atherosclerotic calcification is present. No enlarged lymph nodes are identified. No free fluid or fluid collections are identified. Stranding is noted along the lower anterior abdominal wall and may be related to prior surgery. Moderate thoracolumbar spondylosis is noted.  IMPRESSION: 1. Small bowel dilatation with transition to decompressed small bowel in the right lower quadrant, concerning for partial small bowel obstruction. 2. Sliding hiatal hernia. Contrast in the distal esophagus may reflect gastroesophageal reflux or esophageal dysmotility. 3. Bibasilar atelectasis in the lungs. Superimposed infection is not excluded in the right lower lobe.   Electronically Signed   By: Logan Bores   On: 06/13/2014 14:41   Dg Chest Portable 1 View  06/13/2014   CLINICAL DATA:  80 year old female with nausea, vomiting, diarrhea  EXAM: PORTABLE CHEST - 1 VIEW  COMPARISON:  06/11/2014  FINDINGS: Cardiomediastinal silhouette projects unchanged in size and contour. No  confluent airspace disease, pneumothorax, or pleural effusion.  Atherosclerotic calcifications of the aortic arch.  Electronic loop recorder on the left chest wall. This is unchanged from prior.  No displaced fracture.  Unremarkable appearance of the upper abdomen.  IMPRESSION: No radiographic evidence of acute cardiopulmonary disease.  Atherosclerosis.  Signed,  Dulcy Fanny. Earleen Newport, DO  [facility]  [facility]   Electronically Signed   By: Corrie Mckusick D.O.   On: 06/13/2014 09:45   Dg Abd 2 Views  06/14/2014   CLINICAL DATA:  Small bowel obstruction.  Abdominal pain.  EXAM: ABDOMEN - 2 VIEW  COMPARISON:  CT abdomen and pelvis 06/13/2014. Single view of the abdomen 12/19/2013.  FINDINGS: Is no free intraperitoneal air. Contrast joint the patient's CT scan is not seen throughout the colon sigmoid diverticulosis is noted. No dilated loops of small bowel are identified. NG tube is in place.  IMPRESSION: Marked improvement in small bowel obstruction.  Sigmoid diverticulosis.   Electronically Signed   By: Marcello Moores  Dalessio M.D.   On: 06/14/2014 10:01   Medications: I have reviewed the patient's current medications. Scheduled Meds: . antiseptic oral rinse  7 mL Mouth Rinse BID  . Barium Sulfate  900 mL Oral Once  . heparin  5,000 Units Subcutaneous 3 times per day  . lidocaine  15 mL Mouth/Throat Once  . pantoprazole sodium  40 mg Per Tube Daily   Continuous Infusions: . dextrose 5 % and 0.45% NaCl 100 mL/hr at 06/14/14 0540   PRN Meds:.acetaminophen, albuterol, fentaNYL, ondansetron (ZOFRAN) IV Assessment/Plan:   Partial small bowel obstruction - Patient has had numerous surgeries in past with likely adhesions as cause of her SBO.  Surgery is following however we are treating medically at this time with bowel decompression. Abdominal radiographs today show marked improvement of bowel loops. -Continue NGT intermittent suction  - Continue D5 1/2NS at 100,  potassium is stable at this time. - May be able to remove tube in AM. - Called and spoke with Daughter in law [name]) and updated her about status.    Essential hypertension, benign - Mildly hypertensive, will continue to monitor and hold home BP meds while NPO    GERD (gastroesophageal reflux disease) with Sliding hiatal hernia -Continue IV PPI    Diabetes mellitus without complication -Diet controlled, CBGs well controlled    Chronic kidney disease (CKD), stage IV (severe) - SCr appears to be returning to baseline.    Hyponatremia - Improved  Dispo: Disposition is deferred at this time, awaiting improvement of current medical problems.  Anticipated discharge in approximately 2 day(s).   The patient does have a current PCP (Lucy Maryan Puls, PA-C) and does not need an [facility] hospital follow-up appointment after discharge.  The patient does not have transportation limitations that hinder transportation to clinic appointments.  .Services Needed at time of discharge: Y = Yes, Blank = No PT:   OT:   RN:   Equipment:   Other:     LOS: 1 day   Lucious Groves, DO 06/14/2014, 12:39 PM

## 2014-06-14 NOTE — Progress Notes (Signed)
Subjective: Non verbal no recorded BM but had one in ED  Objective: Vital signs in last 24 hours: Temp:  [98.2 F (36.8 C)-99.5 F (37.5 C)] 99.1 F (37.3 C) (10/03 0541) Pulse Rate:  [82-92] 84 (10/03 0541) Resp:  [17-26] 18 (10/03 0541) BP: (120-154)/(53-103) 146/62 mmHg (10/03 0541) SpO2:  [93 %-100 %] 98 % (10/03 0541) Weight:  [140 lb (63.504 kg)] 140 lb (63.504 kg) (10/02 1729) Last BM Date: 06/13/14  Intake/Output from previous day: 10/02 0701 - 10/03 0700 In: 1171.7 [I.V.:1171.7] Out: 450 [Emesis/NG output:450] Intake/Output this shift:    GI: soft, non-tender; bowel sounds normal; no masses,  no organomegaly  Lab Results:   Recent Labs  06/11/14 1142 06/13/14 0920  WBC 8.5 6.6  HGB 13.3 14.0  HCT 40.4 41.6  PLT 184 223   BMET  Recent Labs  06/13/14 0920 06/14/14 0541  NA 133* 136*  K 4.7 4.2  CL 96 102  CO2 21 23  GLUCOSE 193* 131*  BUN 38* 32*  CREATININE 1.51* 1.23*  CALCIUM 9.0 8.0*   PT/INR No results found for this basename: LABPROT, INR,  in the last 72 hours ABG No results found for this basename: PHART, PCO2, PO2, HCO3,  in the last 72 hours  Studies/Results: Ct Abdomen Pelvis Wo Contrast  06/13/2014   CLINICAL DATA:  Acute onset nausea, vomiting, and diarrhea beginning yesterday. Intermittent epigastric pain, waxing and waning.  EXAM: CT ABDOMEN AND PELVIS WITHOUT CONTRAST  TECHNIQUE: Multidetector CT imaging of the abdomen and pelvis was performed following the standard protocol without IV contrast.  COMPARISON:  12/18/2003  FINDINGS: There is a focal region of opacity in the right lower lobe extending to the pleura, new from prior. Minimal opacity is present in the left lung base in the lower lobe and lingula, likely atelectasis. There is no pleural effusion. Extensive 3 vessel coronary artery calcification is noted. There is a moderate-sized sliding hiatal hernia. Oral contrast is partially visualized in the distal esophagus.  The  liver, gallbladder, spleen, adrenal glands, kidneys, and pancreas have an unremarkable unenhanced appearance.  There is prominent distention of the stomach by oral contrast and retained ingested material. There is colonic diverticulosis without evidence of diverticulitis. The colon is filled with a small-to-moderate amount of liquid stool throughout. Oral contrast is present in multiple mildly dilated loops of small bowel measuring up to approximately 3.7 cm in diameter. The distal ileum is decompressed, and there is a transition from mildly dilated to decompressed small bowel in the right lower quadrant (series 2, image 44). There is mild, diffuse stranding throughout the root of the mesentery.  Multiple surgical clips are now present in the retroperitoneum and pelvis. The uterus is absent. Bladder is grossly unremarkable. Advanced aortoiliac atherosclerotic calcification is present. No enlarged lymph nodes are identified. No free fluid or fluid collections are identified. Stranding is noted along the lower anterior abdominal wall and may be related to prior surgery. Moderate thoracolumbar spondylosis is noted.  IMPRESSION: 1. Small bowel dilatation with transition to decompressed small bowel in the right lower quadrant, concerning for partial small bowel obstruction. 2. Sliding hiatal hernia. Contrast in the distal esophagus may reflect gastroesophageal reflux or esophageal dysmotility. 3. Bibasilar atelectasis in the lungs. Superimposed infection is not excluded in the right lower lobe.   Electronically Signed   By: Logan Bores   On: 06/13/2014 14:41   Dg Chest Portable 1 View  06/13/2014   CLINICAL DATA:  80 year old female with nausea,  vomiting, diarrhea  EXAM: PORTABLE CHEST - 1 VIEW  COMPARISON:  06/11/2014  FINDINGS: Cardiomediastinal silhouette projects unchanged in size and contour. No confluent airspace disease, pneumothorax, or pleural effusion.  Atherosclerotic calcifications of the aortic arch.   Electronic loop recorder on the left chest wall. This is unchanged from prior.  No displaced fracture.  Unremarkable appearance of the upper abdomen.  IMPRESSION: No radiographic evidence of acute cardiopulmonary disease.  Atherosclerosis.  Signed,  Dulcy Fanny. Earleen Newport, DO  [facility]  [facility]   Electronically Signed   By: Corrie Mckusick D.O.   On: 06/13/2014 09:45    Anti-infectives: Anti-infectives   None      Assessment/Plan: p SBO  Dementia Contrast in the colon. Keep NGT until Sunday.  If output low will remove in am.   LOS: 1 day    Manmeet Arzola A. 06/14/2014

## 2014-06-14 NOTE — Progress Notes (Signed)
Patient ID: Alicia [name], female   DOB: 01-Nov-1922, 80 y.o.   MRN: [id number]        Attending progress note    Date of Admission:  06/13/2014     Principal Problem:   Partial small bowel obstruction Active Problems:   Essential hypertension, benign   Dementia   GERD (gastroesophageal reflux disease)   Sliding hiatal hernia   Diabetes mellitus without complication   Chronic kidney disease (CKD), stage IV (severe)   Hyponatremia   . antiseptic oral rinse  7 mL Mouth Rinse BID  . Barium Sulfate  900 mL Oral Once  . heparin  5,000 Units Subcutaneous 3 times per day  . lidocaine  15 mL Mouth/Throat Once  . pantoprazole sodium  40 mg Per Tube Daily    I have seen and examined [name] with Dr. Heber [place]. She has partial small bowel obstruction probably due to adhesions from previous surgeries. She is resting quietly in bed. NG tube is in place. Her abdomen is soft and not obviously tender with absent bowel sounds. We will continue NG to suction and IV fluids.  Michel Bickers, MD [facility] [phone] pager   [phone] cell 06/14/2014, 1:44 PM

## 2014-06-15 ENCOUNTER — Inpatient Hospital Stay (HOSPITAL_COMMUNITY): Payer: Medicare Other

## 2014-06-15 LAB — BASIC METABOLIC PANEL
Anion gap: 9 (ref 5–15)
BUN: 18 mg/dL (ref 6–23)
CALCIUM: 7.7 mg/dL — AB (ref 8.4–10.5)
CO2: 25 mEq/L (ref 19–32)
CREATININE: 1.04 mg/dL (ref 0.50–1.10)
Chloride: 104 mEq/L (ref 96–112)
GFR calc Af Amer: 54 mL/min — ABNORMAL LOW (ref >90)
GFR, EST NON AFRICAN AMERICAN: 47 mL/min — AB (ref >90)
GLUCOSE: 129 mg/dL — AB (ref 70–99)
Potassium: 3.6 mEq/L — ABNORMAL LOW (ref 3.7–5.3)
Sodium: 138 mEq/L (ref 137–147)

## 2014-06-15 MED ORDER — ATORVASTATIN CALCIUM 20 MG PO TABS
20.0000 mg | ORAL_TABLET | Freq: Every day | ORAL | Status: DC
  Administered 2014-06-15 – 2014-06-18 (×3): 20 mg via ORAL
  Filled 2014-06-15 (×3): qty 1
  Filled 2014-06-15 (×2): qty 2
  Filled 2014-06-15: qty 1

## 2014-06-15 MED ORDER — SERTRALINE HCL 50 MG PO TABS
50.0000 mg | ORAL_TABLET | Freq: Every day | ORAL | Status: DC
  Administered 2014-06-15 – 2014-06-18 (×4): 50 mg via ORAL
  Filled 2014-06-15 (×4): qty 1

## 2014-06-15 MED ORDER — POLYETHYLENE GLYCOL 3350 17 G PO PACK: 17.0000 g | PACK | Freq: Every day | ORAL | Status: DC | PRN

## 2014-06-15 MED ORDER — ASPIRIN EC 81 MG PO TBEC
81.0000 mg | DELAYED_RELEASE_TABLET | Freq: Every day | ORAL | Status: DC
  Administered 2014-06-15 – 2014-06-18 (×4): 81 mg via ORAL
  Filled 2014-06-15 (×4): qty 1

## 2014-06-15 MED ORDER — OXYBUTYNIN CHLORIDE ER 10 MG PO TB24
10.0000 mg | ORAL_TABLET | Freq: Every day | ORAL | Status: DC
  Administered 2014-06-15 – 2014-06-18 (×4): 10 mg via ORAL
  Filled 2014-06-15 (×4): qty 1

## 2014-06-15 MED ORDER — ONDANSETRON HCL 4 MG PO TABS: 4.0000 mg | ORAL_TABLET | Freq: Three times a day (TID) | ORAL | Status: DC | PRN

## 2014-06-15 MED ORDER — CALCIUM CARBONATE 1250 (500 CA) MG PO TABS
1250.0000 mg | ORAL_TABLET | Freq: Every day | ORAL | Status: DC
  Administered 2014-06-15 – 2014-06-18 (×3): 1250 mg via ORAL
  Filled 2014-06-15: qty 1
  Filled 2014-06-15: qty 3
  Filled 2014-06-15 (×2): qty 1
  Filled 2014-06-15: qty 3

## 2014-06-15 MED ORDER — AMLODIPINE BESYLATE 2.5 MG PO TABS
2.5000 mg | ORAL_TABLET | Freq: Every day | ORAL | Status: DC
  Administered 2014-06-15 – 2014-06-18 (×4): 2.5 mg via ORAL
  Filled 2014-06-15 (×4): qty 1

## 2014-06-15 MED ORDER — PROMETHAZINE HCL 25 MG PO TABS: 12.5000 mg | ORAL_TABLET | Freq: Three times a day (TID) | ORAL | Status: DC | PRN

## 2014-06-15 MED ORDER — FAMOTIDINE 20 MG PO TABS
20.0000 mg | ORAL_TABLET | Freq: Two times a day (BID) | ORAL | Status: DC
  Administered 2014-06-15 – 2014-06-18 (×7): 20 mg via ORAL
  Filled 2014-06-15 (×9): qty 1

## 2014-06-15 MED ORDER — ADULT MULTIVITAMIN W/MINERALS CH
1.0000 | ORAL_TABLET | Freq: Every day | ORAL | Status: DC
  Administered 2014-06-15 – 2014-06-18 (×4): 1 via ORAL
  Filled 2014-06-15 (×4): qty 1

## 2014-06-15 MED ORDER — MEMANTINE HCL ER 28 MG PO CP24: 28.0000 mg | ORAL_CAPSULE | Freq: Every day | ORAL | Status: DC

## 2014-06-15 MED ORDER — LORATADINE 10 MG PO TABS: 10.0000 mg | ORAL_TABLET | ORAL | Status: DC | PRN

## 2014-06-15 MED ORDER — MEMANTINE HCL 5 MG PO TABS
5.0000 mg | ORAL_TABLET | Freq: Two times a day (BID) | ORAL | Status: DC
  Administered 2014-06-15 (×2): 5 mg via ORAL
  Filled 2014-06-15 (×4): qty 1

## 2014-06-15 NOTE — Progress Notes (Addendum)
Subjective:  Patient was seen and examined this morning. She denies any complaints. She denies headache, chest pain, shortness of breath, nausea, vomiting or abdominal pain. She denies passing gas but thinks she had a bowel movement earlier today.   Per the nurse, patient had a bowel movement yesterday, no episodes of cough or vomiting.   Overnight events: Patient pulled NGT. NGT left out, mittens removed.   I had a very long discussion with the patient's caretaker, her daughter-in-law over the phone. [name] was upset with the care her mother in law received during her last admission and did not feel like she was being listened to. [name] was very upset with me in particular since I was the one who called her on Thursday concerning how the patient was doing. I apologized that I had missed the diagnosis and that [name] felt like she had not been listened to. I discussed with her the care I had for the patient and my own frustration that I had missed the diagnosis. I did explain that based on the patient's vitals, labs, history, and physical exam we did not have concern for a SBO. However, it is difficult in cases with Dementia due to lack of complaints and exam findings. I should have been more investigative to determine the patient had a SBO, but I am not sure anything would have been done differently during last admission since the patient was not vomiting and she was passing stool and had no complaints. I comforted [name] and spent over 30 minutes over the phone with her. I do believe I regained her trust as she thanked me for taking the time to listen and to admit my shortcomings. She does believe that I am a "good doctor." I explained that I will certainly use this as a learning experience for me to rely more on caretaker's history and worries in addition to other ED and Physician's documentation. [name] also had concern that the nurses were not attentive to the patient during last admission on [place], particularly  relating to her fluid intake. [name] believed the patient was dehydrated after last stay. She did believe her current nurses were doing a better job. I told her that her mother in law had been advance to a clear liquid diet and we will take great care to make sure she is drinking water.   Objective: Vital signs in last 24 hours: Filed Vitals:   06/14/14 1437 06/14/14 2133 06/15/14 0559 06/15/14 0944  BP: 144/55 159/62 127/45   Pulse: 78 76 82   Temp: 97.8 F (36.6 C)   97.9 F (36.6 C)  TempSrc: Oral   Oral  Resp: 16 17 16    Height:      Weight:      SpO2: 96% 98% 97%    Weight change:   Intake/Output Summary (Last 24 hours) at 06/15/14 1136 Last data filed at 06/15/14 0851  Gross per 24 hour  Intake 2246.67 ml  Output    125 ml  Net 2121.67 ml   General: Vital signs reviewed.  Patient is well-developed and well-nourished, in no acute distress and cooperative with exam.  Cardiovascular: RRR, S1 normal, S2 normal, no murmurs, gallops, or rubs. Pulmonary/Chest: Bilateral mild crackles in lower lung fields, no wheezes, or rhonchi. Abdominal: Soft, non-tender, non-distended, BS + Musculoskeletal: No joint deformities, erythema, or stiffness, ROM full and nontender. Extremities: No lower extremity edema bilaterally,  pulses symmetric and intact bilaterally. No cyanosis or clubbing. Neurological: A&O x 1 Skin:  Warm, dry and intact. No rashes or erythema. Psychiatric: Normal mood and affect. speech and behavior is normal. Cognition and memory are abnormal.   Lab Results: Basic Metabolic Panel:  Recent Labs Lab 06/14/14 0541 06/15/14 0358  NA 136* 138  K 4.2 3.6*  CL 102 104  CO2 23 25  GLUCOSE 131* 129*  BUN 32* 18  CREATININE 1.23* 1.04  CALCIUM 8.0* 7.7*   Liver Function Tests:  Recent Labs Lab 06/12/14 0123 06/13/14 0920  AST 21 20  ALT 15 15  ALKPHOS 84 85  BILITOT 0.3 0.4  PROT 7.0 7.2  ALBUMIN 3.4* 3.3*    Recent Labs Lab 06/13/14 0920  LIPASE 21    CBC:  Recent Labs Lab 06/11/14 1142 06/13/14 0920  WBC 8.5 6.6  NEUTROABS  --  5.0  HGB 13.3 14.0  HCT 40.4 41.6  MCV 95.1 93.1  PLT 184 223   Cardiac Enzymes:  Recent Labs Lab 06/11/14 1920 06/12/14 0123  TROPONINI <0.30 <0.30   BNP:  Recent Labs Lab 06/11/14 1142  PROBNP 390.1   Urinalysis:  Recent Labs Lab 06/13/14 1048  COLORURINE YELLOW  LABSPEC 1.025  PHURINE 5.0  GLUCOSEU NEGATIVE  HGBUR NEGATIVE  BILIRUBINUR NEGATIVE  KETONESUR 15*  PROTEINUR NEGATIVE  UROBILINOGEN 0.2  NITRITE NEGATIVE  LEUKOCYTESUR NEGATIVE    Micro Results: Recent Results (from the past 240 hour(s))  CLOSTRIDIUM DIFFICILE BY PCR     Status: None   Collection Time    06/13/14  5:38 PM      Result Value Ref Range Status   C difficile by pcr NEGATIVE  NEGATIVE Final   Studies/Results: Ct Abdomen Pelvis Wo Contrast  06/13/2014   CLINICAL DATA:  Acute onset nausea, vomiting, and diarrhea beginning yesterday. Intermittent epigastric pain, waxing and waning.  EXAM: CT ABDOMEN AND PELVIS WITHOUT CONTRAST  TECHNIQUE: Multidetector CT imaging of the abdomen and pelvis was performed following the standard protocol without IV contrast.  COMPARISON:  12/18/2003  FINDINGS: There is a focal region of opacity in the right lower lobe extending to the pleura, new from prior. Minimal opacity is present in the left lung base in the lower lobe and lingula, likely atelectasis. There is no pleural effusion. Extensive 3 vessel coronary artery calcification is noted. There is a moderate-sized sliding hiatal hernia. Oral contrast is partially visualized in the distal esophagus.  The liver, gallbladder, spleen, adrenal glands, kidneys, and pancreas have an unremarkable unenhanced appearance.  There is prominent distention of the stomach by oral contrast and retained ingested material. There is colonic diverticulosis without evidence of diverticulitis. The colon is filled with a small-to-moderate amount of  liquid stool throughout. Oral contrast is present in multiple mildly dilated loops of small bowel measuring up to approximately 3.7 cm in diameter. The distal ileum is decompressed, and there is a transition from mildly dilated to decompressed small bowel in the right lower quadrant (series 2, image 44). There is mild, diffuse stranding throughout the root of the mesentery.  Multiple surgical clips are now present in the retroperitoneum and pelvis. The uterus is absent. Bladder is grossly unremarkable. Advanced aortoiliac atherosclerotic calcification is present. No enlarged lymph nodes are identified. No free fluid or fluid collections are identified. Stranding is noted along the lower anterior abdominal wall and may be related to prior surgery. Moderate thoracolumbar spondylosis is noted.  IMPRESSION: 1. Small bowel dilatation with transition to decompressed small bowel in the right lower quadrant, concerning for partial small bowel obstruction. 2.  Sliding hiatal hernia. Contrast in the distal esophagus may reflect gastroesophageal reflux or esophageal dysmotility. 3. Bibasilar atelectasis in the lungs. Superimposed infection is not excluded in the right lower lobe.   Electronically Signed   By: Logan Bores   On: 06/13/2014 14:41   Dg Abd 2 Views  06/14/2014   CLINICAL DATA:  Small bowel obstruction.  Abdominal pain.  EXAM: ABDOMEN - 2 VIEW  COMPARISON:  CT abdomen and pelvis 06/13/2014. Single view of the abdomen 12/19/2013.  FINDINGS: Is no free intraperitoneal air. Contrast joint the patient's CT scan is not seen throughout the colon sigmoid diverticulosis is noted. No dilated loops of small bowel are identified. NG tube is in place.  IMPRESSION: Marked improvement in small bowel obstruction.  Sigmoid diverticulosis.   Electronically Signed   By: Inge Rise M.D.   On: 06/14/2014 10:01   Medications: I have reviewed the patient's current medications.  Medications Prior to Admission  Medication Sig  Dispense Refill  . Albuterol Sulfate (PROAIR RESPICLICK) 361 (90 BASE) MCG/ACT AEPB Inhale 2 puffs into the lungs every 4 (four) hours as needed (for shortness of breath).       Marland Kitchen amLODipine (NORVASC) 2.5 MG tablet Take 2.5 mg by mouth daily.      Marland Kitchen aspirin EC 81 MG tablet Take 81 mg by mouth daily.      Marland Kitchen atorvastatin (LIPITOR) 20 MG tablet Take 20 mg by mouth at bedtime.       . calcium carbonate (OS-CAL) 600 MG TABS tablet Take 600 mg by mouth at bedtime.      . famotidine (PEPCID) 20 MG tablet Take 1 tablet (20 mg total) by mouth 2 (two) times daily.  10 tablet  0  . loratadine (CLARITIN) 10 MG tablet Take 10 mg by mouth as needed for allergies.       . Memantine HCl ER (NAMENDA XR) 28 MG CP24 Take 28 mg by mouth daily.  90 capsule  3  . Multiple Vitamin (MULTIVITAMIN WITH MINERALS) TABS Take 1 tablet by mouth daily.      Marland Kitchen oxybutynin (DITROPAN-XL) 10 MG 24 hr tablet Take 10 mg by mouth daily.      . polyethylene glycol (MIRALAX / GLYCOLAX) packet Take 17 g by mouth daily as needed for mild constipation. With 8 oz of liquid daily.      . promethazine (PHENERGAN) 12.5 MG tablet Take 1 tablet (12.5 mg total) by mouth every 8 (eight) hours as needed for nausea or vomiting.  30 tablet  0  . sertraline (ZOLOFT) 50 MG tablet Take 50 mg by mouth daily.        Scheduled Meds: . amLODipine  2.5 mg Oral Daily  . antiseptic oral rinse  7 mL Mouth Rinse BID  . aspirin EC  81 mg Oral Daily  . atorvastatin  20 mg Oral QHS  . Barium Sulfate  900 mL Oral Once  . calcium carbonate  1,250 mg Oral QHS  . famotidine  20 mg Oral BID  . heparin  5,000 Units Subcutaneous 3 times per day  . lidocaine  15 mL Mouth/Throat Once  . memantine  5 mg Oral BID  . multivitamin with minerals  1 tablet Oral Daily  . oxybutynin  10 mg Oral Daily  . sertraline  50 mg Oral Daily   Continuous Infusions:   PRN Meds:.acetaminophen, albuterol, fentaNYL, loratadine, ondansetron, polyethylene glycol,  promethazine Assessment/Plan:   Partial small bowel obstruction: Likely secondary to adhesions. Abd  Xray yesterday showed marked improvement in small bowel obstruction. Patient had a bowel movement yesterday and has not vomited. However, patient has always had BMs, just decreased in size. Patient pulled out NGT last night, but surgery okay with discontinuing NGT and advancing diet as tolerated. Surgery has signed off. I had a long discussion with [name] via telephone, please see note in HPI. Patient's abdomen is soft and without guarding. -Clear Liquid Diet -Advance Diet As Tolerated  -Surgery signed off -Discontinue IV fluids -Restart home medications -CBC/BMET tomorrow am  Bibasilar Atelectasis:  CT abdomen/pelvis showed bibasilar atelectasis in the lungs and superimposed infection could not excluded in the right lower lobe. 1 view CXR showed no active cardiopulmonary disease. Patient is afebrile and without leukocytosis, however she does have crackles in the bases of her lungs bilaterally and she is at risk for aspiration pneumonia. 2 view CXR showed mildly increased density at the lung bases consistent with  subsegmental atelectasis. There is no evidence of pulmonary edema. -Stable  Essential hypertension, benign: Blood pressure elevated up to 160s/100s while off of blood pressure medication while NPO. Patient is no longer NPO so we will restart her home medications. -Amlodipine 2.5 mg daily  GERD: CT abdomen/pelvis showed Sliding hiatal hernia and the contrast in the distal esophagus may reflect gastroesophageal reflux or esophageal dysmotility. -Continue home Famotidine 20 mg BID -Zofran 4 mg Q8H prn nausea  Diabetes mellitus without complication: Diabetes is controlled with diet, last hemoglobin A1C 6.1 on 12/19/13.  -Diet controlled, CBGs well controlled -Clear liquid diet -Advance Diet As Tolerated  Chronic kidney disease (CKD), stage IV (severe):  BUN/Cr elevated upon admission,  likely secondary to dehydration. BUN/Cr returning to baseline 18/1.04, likely improved from rehydration. -Improving -Patient back on clear liquid diet, will make sure patient is drinking fluids  Parkinsonian Dementia: Patient is on memantine XR 28 mg and zoloft at home. Since patient has not been receiving her medications recently, will start Memantine at lower dose of 5 mg BID and titrate back up as tolerate. Will check for signs of dizziness. -Memantine 5 mg BID -Zoloft 50 mg daily  DVT/PE ppx: Heparin TID  Dispo: Disposition is deferred at this time, awaiting improvement of current medical problems.  Anticipated discharge in approximately 2 day(s).   The patient does have a current PCP (Lucy Maryan Puls, PA-C) and does not need an [facility] hospital follow-up appointment after discharge.  The patient does not have transportation limitations that hinder transportation to clinic appointments.  .Services Needed at time of discharge: Y = Yes, Blank = No PT:   OT:   RN:   Equipment:   Other:     LOS: 2 days   Osa Craver, MD 06/15/2014, 11:36 AM

## 2014-06-15 NOTE — Progress Notes (Signed)
Subjective: Pt pulled NGT out last night. Awake and responsive this am no distress  Objective: Vital signs in last 24 hours: Temp:  [97.8 F (36.6 C)-99 F (37.2 C)] 97.8 F (36.6 C) (10/03 1437) Pulse Rate:  [76-82] 82 (10/04 0559) Resp:  [16-18] 16 (10/04 0559) BP: (127-163)/(45-63) 127/45 mmHg (10/04 0559) SpO2:  [96 %-98 %] 97 % (10/04 0559) Last BM Date: 06/14/14  Intake/Output from previous day: 10/03 0701 - 10/04 0700 In: 2246.7 [I.V.:2246.7] Out: 125 [Emesis/NG output:125] Intake/Output this shift:  abdomen soft nt nd no rebound or guarding  Lab Results:   Recent Labs  06/13/14 0920  WBC 6.6  HGB 14.0  HCT 41.6  PLT 223   BMET  Recent Labs  06/14/14 0541 06/15/14 0358  NA 136* 138  K 4.2 3.6*  CL 102 104  CO2 23 25  GLUCOSE 131* 129*  BUN 32* 18  CREATININE 1.23* 1.04  CALCIUM 8.0* 7.7*   PT/INR No results found for this basename: LABPROT, INR,  in the last 72 hours ABG No results found for this basename: PHART, PCO2, PO2, HCO3,  in the last 72 hours  Studies/Results: Ct Abdomen Pelvis Wo Contrast  06/13/2014   CLINICAL DATA:  Acute onset nausea, vomiting, and diarrhea beginning yesterday. Intermittent epigastric pain, waxing and waning.  EXAM: CT ABDOMEN AND PELVIS WITHOUT CONTRAST  TECHNIQUE: Multidetector CT imaging of the abdomen and pelvis was performed following the standard protocol without IV contrast.  COMPARISON:  12/18/2003  FINDINGS: There is a focal region of opacity in the right lower lobe extending to the pleura, new from prior. Minimal opacity is present in the left lung base in the lower lobe and lingula, likely atelectasis. There is no pleural effusion. Extensive 3 vessel coronary artery calcification is noted. There is a moderate-sized sliding hiatal hernia. Oral contrast is partially visualized in the distal esophagus.  The liver, gallbladder, spleen, adrenal glands, kidneys, and pancreas have an unremarkable unenhanced appearance.   There is prominent distention of the stomach by oral contrast and retained ingested material. There is colonic diverticulosis without evidence of diverticulitis. The colon is filled with a small-to-moderate amount of liquid stool throughout. Oral contrast is present in multiple mildly dilated loops of small bowel measuring up to approximately 3.7 cm in diameter. The distal ileum is decompressed, and there is a transition from mildly dilated to decompressed small bowel in the right lower quadrant (series 2, image 44). There is mild, diffuse stranding throughout the root of the mesentery.  Multiple surgical clips are now present in the retroperitoneum and pelvis. The uterus is absent. Bladder is grossly unremarkable. Advanced aortoiliac atherosclerotic calcification is present. No enlarged lymph nodes are identified. No free fluid or fluid collections are identified. Stranding is noted along the lower anterior abdominal wall and may be related to prior surgery. Moderate thoracolumbar spondylosis is noted.  IMPRESSION: 1. Small bowel dilatation with transition to decompressed small bowel in the right lower quadrant, concerning for partial small bowel obstruction. 2. Sliding hiatal hernia. Contrast in the distal esophagus may reflect gastroesophageal reflux or esophageal dysmotility. 3. Bibasilar atelectasis in the lungs. Superimposed infection is not excluded in the right lower lobe.   Electronically Signed   By: Logan Bores   On: 06/13/2014 14:41   Dg Chest Portable 1 View  06/13/2014   CLINICAL DATA:  80 year old female with nausea, vomiting, diarrhea  EXAM: PORTABLE CHEST - 1 VIEW  COMPARISON:  06/11/2014  FINDINGS: Cardiomediastinal silhouette projects unchanged in  size and contour. No confluent airspace disease, pneumothorax, or pleural effusion.  Atherosclerotic calcifications of the aortic arch.  Electronic loop recorder on the left chest wall. This is unchanged from prior.  No displaced fracture.   Unremarkable appearance of the upper abdomen.  IMPRESSION: No radiographic evidence of acute cardiopulmonary disease.  Atherosclerosis.  Signed,  Dulcy Fanny. Earleen Newport, DO  [facility]  [facility]   Electronically Signed   By: Corrie Mckusick D.O.   On: 06/13/2014 09:45   Dg Abd 2 Views  06/14/2014   CLINICAL DATA:  Small bowel obstruction.  Abdominal pain.  EXAM: ABDOMEN - 2 VIEW  COMPARISON:  CT abdomen and pelvis 06/13/2014. Single view of the abdomen 12/19/2013.  FINDINGS: Is no free intraperitoneal air. Contrast joint the patient's CT scan is not seen throughout the colon sigmoid diverticulosis is noted. No dilated loops of small bowel are identified. NG tube is in place.  IMPRESSION: Marked improvement in small bowel obstruction.  Sigmoid diverticulosis.   Electronically Signed   By: Inge Rise M.D.   On: 06/14/2014 10:01    Anti-infectives: Anti-infectives   None      Assessment/Plan: pSBO improved Pt moving bowels and films normal.  Exam benign.  Advance diet as tolerated .  Will sign off   LOS: 2 days    Shomari Matusik A. 06/15/2014

## 2014-06-15 NOTE — Discharge Summary (Signed)
Name: Alicia [name] MRN: [id number] DOB: [birth year] 80 y.o. PCP: Alicia Carwin, PA-C  Date of Admission: 06/13/2014  8:31 AM Date of Discharge: 06/17/2014 Attending Physician: Annia Belt, MD  Discharge Diagnosis:  Principal Problem:   Partial small bowel obstruction Active Problems:   Essential hypertension, benign   Dementia   GERD (gastroesophageal reflux disease)   Sliding hiatal hernia   Diabetes mellitus without complication   Chronic kidney disease (CKD), stage IV (severe)  Discharge Medications:   Medication List         amLODipine 2.5 MG tablet  Commonly known as:  NORVASC  Take 2.5 mg by mouth daily.     aspirin EC 81 MG tablet  Take 81 mg by mouth daily.     atorvastatin 20 MG tablet  Commonly known as:  LIPITOR  Take 20 mg by mouth at bedtime.     calcium carbonate 600 MG Tabs tablet  Commonly known as:  OS-CAL  Take 600 mg by mouth at bedtime.     famotidine 20 MG tablet  Commonly known as:  PEPCID  Take 1 tablet (20 mg total) by mouth 2 (two) times daily.     loratadine 10 MG tablet  Commonly known as:  CLARITIN  Take 10 mg by mouth as needed for allergies.     Memantine HCl ER 28 MG Cp24  Commonly known as:  NAMENDA XR  Take 28 mg by mouth daily.     multivitamin with minerals Tabs tablet  Take 1 tablet by mouth daily.     oxybutynin 10 MG 24 hr tablet  Commonly known as:  DITROPAN-XL  Take 10 mg by mouth daily.     polyethylene glycol packet  Commonly known as:  MIRALAX / GLYCOLAX  Take 17 g by mouth daily as needed for mild constipation. With 8 oz of liquid daily.     PROAIR RESPICLICK 749 (90 BASE) MCG/ACT Aepb  Generic drug:  Albuterol Sulfate  Inhale 2 puffs into the lungs every 4 (four) hours as needed (for shortness of breath).     promethazine 12.5 MG tablet  Commonly known as:  PHENERGAN  Take 1 tablet (12.5 mg total) by mouth every 8 (eight) hours as needed for nausea or vomiting.     sertraline 50 MG  tablet  Commonly known as:  ZOLOFT  Take 50 mg by mouth daily.        Disposition and follow-up:   Ms.Alicia [name] was discharged from [facility] in Good condition.  At the hospital follow up visit please address:  1.  Partial Small Bowel Obstruction: Please make sure that the patient does not have recurrent symptoms of nausea with vomiting, abdominal distention, decreased appetite, dry heaving, cessation of passage of stool or gas.   Please keep close record of when patient has bowel movement and how much volume there is to the stool.  Please make sure patient is drinking plenty of fluids, especially water. She needs to be encouraged to drink water.  2.  Labs / imaging needed at time of follow-up: None  3.  Pending labs/ test needing follow-up: None  Follow-up Appointments:   Discharge Instructions: Discharge Instructions   Call MD for:  persistant nausea and vomiting    Complete by:  As directed      Call MD for:  severe uncontrolled pain    Complete by:  As directed      Diet - low sodium heart  healthy    Complete by:  As directed      Diet - low sodium heart healthy    Complete by:  As directed      Increase activity slowly    Complete by:  As directed      Increase activity slowly    Complete by:  As directed            Consultations:   None  Procedures Performed:  Ct Abdomen Pelvis Wo Contrast  06/13/2014   CLINICAL DATA:  Acute onset nausea, vomiting, and diarrhea beginning yesterday. Intermittent epigastric pain, waxing and waning.  EXAM: CT ABDOMEN AND PELVIS WITHOUT CONTRAST  TECHNIQUE: Multidetector CT imaging of the abdomen and pelvis was performed following the standard protocol without IV contrast.  COMPARISON:  12/18/2003  FINDINGS: There is a focal region of opacity in the right lower lobe extending to the pleura, new from prior. Minimal opacity is present in the left lung base in the lower lobe and lingula, likely atelectasis.  There is no pleural effusion. Extensive 3 vessel coronary artery calcification is noted. There is a moderate-sized sliding hiatal hernia. Oral contrast is partially visualized in the distal esophagus.  The liver, gallbladder, spleen, adrenal glands, kidneys, and pancreas have an unremarkable unenhanced appearance.  There is prominent distention of the stomach by oral contrast and retained ingested material. There is colonic diverticulosis without evidence of diverticulitis. The colon is filled with a small-to-moderate amount of liquid stool throughout. Oral contrast is present in multiple mildly dilated loops of small bowel measuring up to approximately 3.7 cm in diameter. The distal ileum is decompressed, and there is a transition from mildly dilated to decompressed small bowel in the right lower quadrant (series 2, image 44). There is mild, diffuse stranding throughout the root of the mesentery.  Multiple surgical clips are now present in the retroperitoneum and pelvis. The uterus is absent. Bladder is grossly unremarkable. Advanced aortoiliac atherosclerotic calcification is present. No enlarged lymph nodes are identified. No free fluid or fluid collections are identified. Stranding is noted along the lower anterior abdominal wall and may be related to prior surgery. Moderate thoracolumbar spondylosis is noted.  IMPRESSION: 1. Small bowel dilatation with transition to decompressed small bowel in the right lower quadrant, concerning for partial small bowel obstruction. 2. Sliding hiatal hernia. Contrast in the distal esophagus may reflect gastroesophageal reflux or esophageal dysmotility. 3. Bibasilar atelectasis in the lungs. Superimposed infection is not excluded in the right lower lobe.   Electronically Signed   By: Logan Bores   On: 06/13/2014 14:41   Dg Chest 2 View  06/15/2014   CLINICAL DATA:  Diabetes, hypertension and partial small bowel obstruction; dementia  EXAM: CHEST  2 VIEW  COMPARISON:  PA and  lateral chest x-ray of correction portable chest x-ray of June 13, 2014  FINDINGS: The lungs are mildly hypoinflated. There is no focal infiltrate. There are coarse lung markings at both lung bases which may reflect atelectasis. This is more conspicuous than on the previous study. There is no significant pleural effusion the cardiac silhouette is top-normal in size. The pulmonary vascularity is not engorged. A cardiac rhythm recording device is present.  IMPRESSION: Mildly increased density at the lung bases is consistent with subsegmental atelectasis. There is no evidence of pulmonary edema.   Electronically Signed   By: David  Martinique   On: 06/15/2014 12:24   Dg Chest 2 View  05/29/2014   CLINICAL DATA:  Abdominal and chest pain.  Hypertension.  EXAM: CHEST  2 VIEW  COMPARISON:  Chest radiograph performed earlier today at 5:15 p.m.  FINDINGS: The lungs are mildly hypoexpanded. Mild left basilar opacity may reflect atelectasis or scarring, stable from prior studies. A metallic device is noted at the anterior left chest wall.  The heart is normal in size; the mediastinal contour is within normal limits. No acute osseous abnormalities are seen.  IMPRESSION: Lungs mildly hypoexpanded. Mild left basilar opacity is stable from prior studies and may reflect atelectasis or scarring.   Electronically Signed   By: Garald Balding M.D.   On: 05/29/2014 00:11   Dg Chest Portable 1 View  06/13/2014   CLINICAL DATA:  80 year old female with nausea, vomiting, diarrhea  EXAM: PORTABLE CHEST - 1 VIEW  COMPARISON:  06/11/2014  FINDINGS: Cardiomediastinal silhouette projects unchanged in size and contour. No confluent airspace disease, pneumothorax, or pleural effusion.  Atherosclerotic calcifications of the aortic arch.  Electronic loop recorder on the left chest wall. This is unchanged from prior.  No displaced fracture.  Unremarkable appearance of the upper abdomen.  IMPRESSION: No radiographic evidence of acute  cardiopulmonary disease.  Atherosclerosis.  Signed,  Dulcy Fanny. Earleen Newport, DO  [facility]  [facility]   Electronically Signed   By: Corrie Mckusick D.O.   On: 06/13/2014 09:45   Dg Chest Port 1 View  06/11/2014   CLINICAL DATA:  Shortness of breath.  Nausea.  Hypertension.  EXAM: PORTABLE CHEST - 1 VIEW  COMPARISON:  05/28/2014  FINDINGS: Midline trachea. Normal heart size. Moderate right hemidiaphragm elevation. No pleural effusion or pneumothorax. Left base opacity which is similar to on the prior exam.  IMPRESSION: Chronic left base opacity, favored to represent an area of scarring.  Otherwise, no acute finding.   Electronically Signed   By: Abigail Miyamoto M.D.   On: 06/11/2014 12:13   Dg Abd 2 Views  06/14/2014   CLINICAL DATA:  Small bowel obstruction.  Abdominal pain.  EXAM: ABDOMEN - 2 VIEW  COMPARISON:  CT abdomen and pelvis 06/13/2014. Single view of the abdomen 12/19/2013.  FINDINGS: Is no free intraperitoneal air. Contrast joint the patient's CT scan is not seen throughout the colon sigmoid diverticulosis is noted. No dilated loops of small bowel are identified. NG tube is in place.  IMPRESSION: Marked improvement in small bowel obstruction.  Sigmoid diverticulosis.   Electronically Signed   By: Inge Rise M.D.   On: 06/14/2014 10:01   Admission HPI:   80 yo female with CAD, HTN, HLD, Parkinsons dementia, Anxiety, depression, diastolic CHF1 recently discharged on 06/12/14 admitted for n/v and chest pain/epigastric pain, thought to be 2/2 to GERD. Didn't have any n/v on discharge. Today comes back for vomitting since discharge yesterday. Threw up after eating continuously until emesis became green. Started vomitting today morning too after eating.  CT abdomen was done in the ED showing small bowel dilation with transition to decompressed small bowel in RLQ likely partial SBO. Sliding hiatal hernia, bibasilar atelectasis. CXR negative.  Patient had 1  solid BM in the ED and 1 loose stool nonbloody. Admits to having flatus. Denies any abdominal pain to me but did have some according to ED note. Denies any fever/chills/SOB/cp.  History very limited due to patient's dementia. Got more info from daughter in law  Emesis 3x since last night. Threw up what she ate and also continued to throw up last night and this AM.  Hospital Course by problem list:  Principal Problem:   Partial small bowel obstruction Active Problems:   Essential hypertension, benign   Dementia   GERD (gastroesophageal reflux disease)   Sliding hiatal hernia   Diabetes mellitus without complication   Chronic kidney disease (CKD), stage IV (severe)   Partial small bowel obstruction: Patient presented with persistent nausea and vomiting with green bilious material. CT abdomen showed small bowel dilation with transition to decompressed small bowel in RLQ likely partial SBO, sliding hiatal hernia, and bibasilar atelectasis. CXR negative for acute cardiopulmonary disease. Patient's partial SBO is likely secondary to adhesions since patient has history of abdominal surgary. Patient was treated with Nasogastric tube decompression, IV fluids, phenergan IV for nausea, and was kept npo for bowel rest. The following day, Abd Xray showed marked improvement in small bowel obstruction. Patient was improving with no more signs of nausea or vomiting. She repetitively kept pulling out her NG tube so we kept it out after day 3. Patient began tolerating a clear liquid diet without nausea or vomiting and was passing gas and having bowel movements. Her abdominal exam was benign, without tenderness, and normoactive bowel sounds without high pitched noises. Surgery was originally consulted, but the SBO was partial and did not require surgical intervention. Surgery signed off. Patient was advanced to a regular diet and the patient continued to do well until discharge. The patient's family received education on  the diagnosis, signs and symptoms to look for and education that an SBO could very well likely happen again.   Bibasilar Atelectasis: CT abdomen/pelvis showed bibasilar atelectasis in the lungs and superimposed infection could not excluded in the right lower lobe. 1 view CXR showed no active cardiopulmonary disease. Patient was afebrile and without leukocytosis, however she did have crackles in the bases of her lungs bilaterally and she is at risk for aspiration pneumonia. 2 view CXR showed mildly increased density at the lung bases consistent with subsegmental atelectasis. There was no evidence of pulmonary edema. Patient will likely have chronic crackles in her lower lung fields. Pneumonia and volume overload were not of concern.  Essential hypertension, benign: Blood pressure was elevated up to 160s/100s while off of blood pressure medication while NPO. Home medications were restarted once patient back on a diet. She tolerated her Amlodipine 2.5 mg daily; however, she did continue to have mildly elevated blood pressures. Upon talking with the daughter in law, she requested keeping patient at amlodipine 2.5 mg daily since patient is prone to hypotensive episodes. We discharged the patient home on Amlodipine 2.5 mg daily.  GERD: CT abdomen/pelvis showed Sliding hiatal hernia and the contrast in the distal esophagus may reflect gastroesophageal reflux or esophageal dysmotility. Patient did not complain of nausea and had no recurrent episodes of vomiting. We continued her Famotidine 20 mg BID during stay and on discharge. Patient did have Zofran 4 mg Q8H prn nausea but did not require it. This was not prescribed on discharge.  Diabetes mellitus without complication: Diabetes is controlled with diet, last hemoglobin A1C 6.1 on 12/19/13.  -Diet controlled, CBGs well controlled  -Clear liquid diet  -Advance Diet As Tolerated   Chronic kidney disease (CKD), stage IV (severe): BUN/Cr was elevated upon  admission, likely secondary to dehydration. BUN/Cr returned to baseline 18/1.04, likely improved from rehydration. Patient tolerated regular diet, but needed to be encouraged to drink water.  Parkinsonian Dementia: Patient is on memantine XR 28 mg and zoloft at home. Patient did not been receive her medications while NPO and Memantine needed to  be started at lower dose of 5 mg BID and titrated back up as tolerated. Patient tolerated the titration well and without dizziness. We continued her home Memantine XR 28 mg daily during stay and on discharge.   Discharge Vitals:   BP 162/58  Pulse 76  Temp(Src) 97.6 F (36.4 C) (Oral)  Resp 18  Ht 5\' 7"  (1.702 m)  Wt 63.504 kg (140 lb)  BMI 21.92 kg/m2  SpO2 100%  Discharge Labs:  Results for orders placed during the hospital encounter of 06/13/14 (from the past 24 hour(s))  BASIC METABOLIC PANEL     Status: Abnormal   Collection Time    06/17/14  4:40 AM      Result Value Ref Range   Sodium 142  137 - 147 mEq/L   Potassium 4.1  3.7 - 5.3 mEq/L   Chloride 105  96 - 112 mEq/L   CO2 24  19 - 32 mEq/L   Glucose, Bld 100 (*) 70 - 99 mg/dL   BUN 14  6 - 23 mg/dL   Creatinine, Ser 1.11 (*) 0.50 - 1.10 mg/dL   Calcium 8.6  8.4 - 10.5 mg/dL   GFR calc non Af Amer 43 (*) >90 mL/min   GFR calc Af Amer 50 (*) >90 mL/min   Anion gap 13  5 - 15    Signed: Osa Craver, DO PGY-1 Internal Medicine Resident Pager # [phone] 06/17/2014 4:24 PM

## 2014-06-16 DIAGNOSIS — N189 Chronic kidney disease, unspecified: Secondary | ICD-10-CM

## 2014-06-16 DIAGNOSIS — F0280 Dementia in other diseases classified elsewhere without behavioral disturbance: Secondary | ICD-10-CM

## 2014-06-16 DIAGNOSIS — G20 Parkinson's disease: Secondary | ICD-10-CM

## 2014-06-16 LAB — CBC
HCT: 35.2 % — ABNORMAL LOW (ref 36.0–46.0)
Hemoglobin: 11.6 g/dL — ABNORMAL LOW (ref 12.0–15.0)
MCH: 30.1 pg (ref 26.0–34.0)
MCHC: 33 g/dL (ref 30.0–36.0)
MCV: 91.4 fL (ref 78.0–100.0)
PLATELETS: 189 10*3/uL (ref 150–400)
RBC: 3.85 MIL/uL — AB (ref 3.87–5.11)
RDW: 13.7 % (ref 11.5–15.5)
WBC: 5.3 10*3/uL (ref 4.0–10.5)

## 2014-06-16 LAB — BASIC METABOLIC PANEL
ANION GAP: 9 (ref 5–15)
BUN: 13 mg/dL (ref 6–23)
CO2: 24 meq/L (ref 19–32)
Calcium: 8.5 mg/dL (ref 8.4–10.5)
Chloride: 106 mEq/L (ref 96–112)
Creatinine, Ser: 1.05 mg/dL (ref 0.50–1.10)
GFR calc Af Amer: 53 mL/min — ABNORMAL LOW (ref >90)
GFR calc non Af Amer: 46 mL/min — ABNORMAL LOW (ref >90)
Glucose, Bld: 97 mg/dL (ref 70–99)
Potassium: 4 mEq/L (ref 3.7–5.3)
SODIUM: 139 meq/L (ref 137–147)

## 2014-06-16 MED ORDER — MEMANTINE HCL ER 28 MG PO CP24
28.0000 mg | ORAL_CAPSULE | Freq: Every day | ORAL | Status: DC
  Administered 2014-06-16 – 2014-06-18 (×3): 28 mg via ORAL
  Filled 2014-06-16 (×3): qty 28

## 2014-06-16 NOTE — Progress Notes (Signed)
Subjective:  Patient was seen and examined this morning. Patient denies any complaints, but she does not typically complain. She denies nausea, vomiting, abdominal pain. She had a large bowel movement this morning and then a smaller bowel movement as well. Her stool was green.   Overnight events: Patient tolerating regular diet. 2 bowel movements.    Objective: Vital signs in last 24 hours: Filed Vitals:   06/15/14 1343 06/15/14 2130 06/16/14 0610 06/16/14 0957  BP: 162/47 172/61 186/67 141/48  Pulse: 68 81 77   Temp: 97.4 F (36.3 C) 97.6 F (36.4 C) 98 F (36.7 C)   TempSrc: Axillary Oral Oral   Resp: 15 16 16    Height:      Weight:      SpO2: 100% 97% 100%    Weight change:   Intake/Output Summary (Last 24 hours) at 06/16/14 1135 Last data filed at 06/16/14 1100  Gross per 24 hour  Intake    480 ml  Output    200 ml  Net    280 ml   General: Vital signs reviewed.  Patient is well-developed and well-nourished, in no acute distress and cooperative with exam. OOB to chair. Cardiovascular: RRR, S1 normal, S2 normal, no murmurs, gallops, or rubs. Pulmonary/Chest: Bilateral mild crackles in lower lung fields, no wheezes, or rhonchi. Abdominal: Soft, non-tender, non-distended, BS are normoactive. No tinkling or high-pitched noises. Musculoskeletal: No joint deformities, erythema, or stiffness, ROM full and nontender. Extremities: No lower extremity edema bilaterally,  pulses symmetric and intact bilaterally. No cyanosis or clubbing. Neurological: A&O x 1 Skin: Warm, dry and intact. No rashes or erythema. Psychiatric: Normal mood and affect. speech and behavior is normal. Cognition and memory are abnormal.   Lab Results: Basic Metabolic Panel:  Recent Labs Lab 06/15/14 0358 06/16/14 0620  NA 138 139  K 3.6* 4.0  CL 104 106  CO2 25 24  GLUCOSE 129* 97  BUN 18 13  CREATININE 1.04 1.05  CALCIUM 7.7* 8.5   Liver Function Tests:  Recent Labs Lab 06/12/14 0123  06/13/14 0920  AST 21 20  ALT 15 15  ALKPHOS 84 85  BILITOT 0.3 0.4  PROT 7.0 7.2  ALBUMIN 3.4* 3.3*    Recent Labs Lab 06/13/14 0920  LIPASE 21   CBC:  Recent Labs Lab 06/13/14 0920 06/16/14 0620  WBC 6.6 5.3  NEUTROABS 5.0  --   HGB 14.0 11.6*  HCT 41.6 35.2*  MCV 93.1 91.4  PLT 223 189   Cardiac Enzymes:  Recent Labs Lab 06/11/14 1920 06/12/14 0123  TROPONINI <0.30 <0.30   BNP:  Recent Labs Lab 06/11/14 1142  PROBNP 390.1   Urinalysis:  Recent Labs Lab 06/13/14 1048  COLORURINE YELLOW  LABSPEC 1.025  PHURINE 5.0  GLUCOSEU NEGATIVE  HGBUR NEGATIVE  BILIRUBINUR NEGATIVE  KETONESUR 15*  PROTEINUR NEGATIVE  UROBILINOGEN 0.2  NITRITE NEGATIVE  LEUKOCYTESUR NEGATIVE    Micro Results: Recent Results (from the past 240 hour(s))  CLOSTRIDIUM DIFFICILE BY PCR     Status: None   Collection Time    06/13/14  5:38 PM      Result Value Ref Range Status   C difficile by pcr NEGATIVE  NEGATIVE Final   Studies/Results: Dg Chest 2 View  06/15/2014   CLINICAL DATA:  Diabetes, hypertension and partial small bowel obstruction; dementia  EXAM: CHEST  2 VIEW  COMPARISON:  PA and lateral chest x-ray of correction portable chest x-ray of June 13, 2014  FINDINGS: The  lungs are mildly hypoinflated. There is no focal infiltrate. There are coarse lung markings at both lung bases which may reflect atelectasis. This is more conspicuous than on the previous study. There is no significant pleural effusion the cardiac silhouette is top-normal in size. The pulmonary vascularity is not engorged. A cardiac rhythm recording device is present.  IMPRESSION: Mildly increased density at the lung bases is consistent with subsegmental atelectasis. There is no evidence of pulmonary edema.   Electronically Signed   By: David  Martinique   On: 06/15/2014 12:24   Medications: I have reviewed the patient's current medications.  Medications Prior to Admission  Medication Sig Dispense  Refill  . Albuterol Sulfate (PROAIR RESPICLICK) 539 (90 BASE) MCG/ACT AEPB Inhale 2 puffs into the lungs every 4 (four) hours as needed (for shortness of breath).       Marland Kitchen amLODipine (NORVASC) 2.5 MG tablet Take 2.5 mg by mouth daily.      Marland Kitchen aspirin EC 81 MG tablet Take 81 mg by mouth daily.      Marland Kitchen atorvastatin (LIPITOR) 20 MG tablet Take 20 mg by mouth at bedtime.       . calcium carbonate (OS-CAL) 600 MG TABS tablet Take 600 mg by mouth at bedtime.      . famotidine (PEPCID) 20 MG tablet Take 1 tablet (20 mg total) by mouth 2 (two) times daily.  10 tablet  0  . loratadine (CLARITIN) 10 MG tablet Take 10 mg by mouth as needed for allergies.       . Memantine HCl ER (NAMENDA XR) 28 MG CP24 Take 28 mg by mouth daily.  90 capsule  3  . Multiple Vitamin (MULTIVITAMIN WITH MINERALS) TABS Take 1 tablet by mouth daily.      Marland Kitchen oxybutynin (DITROPAN-XL) 10 MG 24 hr tablet Take 10 mg by mouth daily.      . polyethylene glycol (MIRALAX / GLYCOLAX) packet Take 17 g by mouth daily as needed for mild constipation. With 8 oz of liquid daily.      . promethazine (PHENERGAN) 12.5 MG tablet Take 1 tablet (12.5 mg total) by mouth every 8 (eight) hours as needed for nausea or vomiting.  30 tablet  0  . sertraline (ZOLOFT) 50 MG tablet Take 50 mg by mouth daily.        Scheduled Meds: . amLODipine  2.5 mg Oral Daily  . antiseptic oral rinse  7 mL Mouth Rinse BID  . aspirin EC  81 mg Oral Daily  . atorvastatin  20 mg Oral QHS  . Barium Sulfate  900 mL Oral Once  . calcium carbonate  1,250 mg Oral QHS  . famotidine  20 mg Oral BID  . heparin  5,000 Units Subcutaneous 3 times per day  . lidocaine  15 mL Mouth/Throat Once  . Memantine HCl ER  28 mg Oral Daily  . multivitamin with minerals  1 tablet Oral Daily  . oxybutynin  10 mg Oral Daily  . sertraline  50 mg Oral Daily   Continuous Infusions:   PRN Meds:.acetaminophen, albuterol, fentaNYL, loratadine, ondansetron, polyethylene glycol,  promethazine Assessment/Plan:   Partial small bowel obstruction: Resolved. Patient is tolerating a regular diet without nausea or vomiting. She is having normal bowel movements. Her abdominal exam is benign. Normoactive bowel sounds without high pitched tinkling, no distention or tenderness. -PT/OT -Regular Diet -Monitor  Essential hypertension, benign: Blood pressure improved, but mildly elevated. 140/48 this morning. Daughter-in-law states that patient has a history of  hypotensive episode and amlodipine 2.5 mg daily has been the best treatment for her. -Amlodipine 2.5 mg daily  GERD: CT abdomen/pelvis showed Sliding hiatal hernia and the contrast in the distal esophagus may reflect gastroesophageal reflux or esophageal dysmotility. No complaints of nausea or vomiting. -Continue home Famotidine 20 mg BID -Zofran 4 mg Q8H prn nausea  Diabetes mellitus without complication: Diabetes is controlled with diet, last hemoglobin A1C 6.1 on 12/19/13.  -Diet controlled -Regular Diet  Acute on Chronic Kidney Disease:  BUN/Cr elevated upon admission, likely secondary to dehydration. BUN/Cr returning to baseline 13/1.05, likely improved from rehydration on regular diet and water intake. -Resolved -Regular Diet -Ensure patient is drinking water -BMET tomorrow am  Parkinsonian Dementia: Patient is on memantine XR 28 mg and zoloft at home. Restarting memantine at lower dose to titrate up. No dizziness. -PT/OT -Memantine 5 mg BID -Zoloft 50 mg daily  DVT/PE ppx: Heparin TID  Dispo: Disposition is deferred at this time, awaiting improvement of current medical problems.  Anticipated discharge in approximately 2 day(s).   The patient does have a current PCP (Lucy Maryan Puls, PA-C) and does not need an [facility] hospital follow-up appointment after discharge.  The patient does not have transportation limitations that hinder transportation to clinic appointments.  .Services Needed at time of discharge: Y =  Yes, Blank = No PT:   OT:   RN:   Equipment:   Other:     LOS: 3 days   Osa Craver, MD 06/16/2014, 11:35 AM

## 2014-06-16 NOTE — Evaluation (Signed)
Occupational Therapy Evaluation and Discharge Patient Details Name: ZOA DOWTY MRN: [id number] DOB: [birth year] Today's Date: 06/16/2014    History of Present Illness 80 y.o. female admitted vomitting and found to have small bowel obstruction. PMHx: CAD, HTN, HLD, Parkinsons dementia, Anxiety, depression, diastolic CHF1    Clinical Impression   This 80 yo female admitted with above presents to acute OT with decreased cognition, decreased balance, decreased safety awareness thus making her not able to care for herself. She will benefit from continued OT at SNF to get to a full S level and then return home. Acute OT will sign off.    Follow Up Recommendations  SNF    Equipment Recommendations  None recommended by OT       Precautions / Restrictions Precautions Precautions: Fall Restrictions Weight Bearing Restrictions: No      Mobility Bed Mobility               General bed mobility comments: Pt trying to get up from recliner upon my arrival thus setting off chair alarm  Transfers Overall transfer level: Needs assistance   Transfers: Sit to/from Stand Sit to Stand: Min guard;Supervision (due to cognitive deficits)              Balance Overall balance assessment: Needs assistance Sitting-balance support: No upper extremity supported;Feet supported Sitting balance-Leahy Scale: Normal     Standing balance support: Single extremity supported Standing balance-Leahy Scale: Fair                              ADL Overall ADL's : Needs assistance/impaired                                       General ADL Comments: Need Min guard to S due to decreased awareness of safety considerations due to dementia               Pertinent Vitals/Pain Pain Assessment: No/denies pain     Hand Dominance  right   Extremity/Trunk Assessment Upper Extremity Assessment Upper Extremity Assessment: Overall WFL for tasks assessed               Cognition Arousal/Alertness: Awake/alert Behavior During Therapy: WFL for tasks assessed/performed Overall Cognitive Status: No family/caregiver present to determine baseline cognitive functioning                                Home Living Family/patient expects to be discharged to:: Skilled nursing facility                                             OT Diagnosis: Cognitive deficits;Generalized weakness   OT Problem List: Decreased strength;Decreased cognition      OT Goals(Current goals can be found in the care plan section) Acute Rehab OT Goals Patient Stated Goal: unable to state due to cogntive pre-exisisting cognitive impairments  OT Frequency:                End of Session Equipment Utilized During Treatment:  (intermittent HHA) Nurse Communication:  (Pt back in recliner with chair alarm set)  Activity Tolerance: Patient tolerated treatment well Patient left: in chair;with call bell/phone  within reach;with chair alarm set   Time: [phone] OT Time Calculation (min): 17 min Charges:  OT General Charges $OT Visit: 1 Procedure OT Evaluation $Initial OT Evaluation Tier I: 1 Procedure OT Treatments $Self Care/Home Management : 8-22 mins  Almon Register [phone] 06/16/2014, 4:23 PM

## 2014-06-16 NOTE — Evaluation (Signed)
Physical Therapy Evaluation Patient Details Name: Alicia [name] MRN: [id number] DOB: May 04, 1923 Today's Date: 06/16/2014   History of Present Illness  80 y.o. female admitted with small bowel obstruction  Clinical Impression  Patient evaluated by Physical Therapy with no further acute PT needs identified. All education has been completed and the patient has no further questions. Pt with no complaints of pain. Reports she lives at a SNF and uses a walker at baseline. She ambulates generally well up to 300 feet today with supervision for safety and minor cues. No loss of balance noted. See below for any follow-up Physial Therapy or equipment needs. PT is signing off. Thank you for this referral.     Follow Up Recommendations No PT follow up;SNF (Return to SNF due to cognitive deficits)    Equipment Recommendations  None recommended by PT    Recommendations for Other Services       Precautions / Restrictions Precautions Precautions: None Restrictions Weight Bearing Restrictions: No      Mobility  Bed Mobility                  Transfers Overall transfer level: Needs assistance Equipment used: Rolling walker (2 wheeled) Transfers: Sit to/from Stand Sit to Stand: Supervision         General transfer comment: Supervision for safety with VC for hand placement. Good stability upon standing  Ambulation/Gait Ambulation/Gait assistance: Supervision Ambulation Distance (Feet): 300 Feet Assistive device: Rolling walker (2 wheeled) Gait Pattern/deviations: Step-through pattern;Drifts right/left   Gait velocity interpretation: Below normal speed for age/gender General Gait Details: Educated on safe DME use. Pt reports using RW at baseline. VC for walker placement. No loss of balance. Able to include short 10 foot distance without an assistive device - demonstrated mild sway and slower speed but overall stable. Suggested RW for safety.  Stairs             Wheelchair Mobility    Modified Rankin (Stroke Patients Only)       Balance Overall balance assessment: Needs assistance Sitting-balance support: No upper extremity supported;Feet supported Sitting balance-Leahy Scale: Normal     Standing balance support: No upper extremity supported Standing balance-Leahy Scale: Good                               Pertinent Vitals/Pain Pain Assessment: No/denies pain    Home Living Family/patient expects to be discharged to:: Skilled nursing facility   Available Help at Discharge: [facility] Type of Home: [facility] ("[place] facility [place]         Home Equipment: Environmental consultant - 2 wheels Additional Comments: Pt reports she lives at a SNF "[place] of [place]" no family available today to provide further information    Prior Function Level of Independence: Needs assistance   Gait / Transfers Assistance Needed: Pt reports she uses a rolling walker at baseline  ADL's / Homemaking Assistance Needed: States people help her bath/dress at the skilled facility  Comments: No family available to confirm - this is per pt. report     Hand Dominance   Dominant Hand: Right    Extremity/Trunk Assessment   Upper Extremity Assessment: Defer to OT evaluation           Lower Extremity Assessment: Overall WFL for tasks assessed         Communication   Communication: HOH  Cognition Arousal/Alertness: Awake/alert Behavior During  Therapy: WFL for tasks assessed/performed Overall Cognitive Status: History of cognitive impairments - at baseline                      General Comments General comments (skin integrity, edema, etc.): No family present during PT evaluation - reviewed past notes and pt reports she lives at a SNF, using a rolling walker at baseline.    Exercises        Assessment/Plan    PT Assessment Patent does not need any further PT services  PT  Diagnosis Abnormality of gait   PT Problem List    PT Treatment Interventions     PT Goals (Current goals can be found in the Care Plan section) Acute Rehab PT Goals Patient Stated Goal: none stated PT Goal Formulation: No goals set, d/c therapy    Frequency     Barriers to discharge        Co-evaluation               End of Session Equipment Utilized During Treatment: Gait belt Activity Tolerance: Patient tolerated treatment well Patient left: in chair;with call bell/phone within reach;with chair alarm set           Time: 0623-7628 PT Time Calculation (min): 18 min   Charges:   PT Evaluation $Initial PT Evaluation Tier I: 1 Procedure PT Treatments $Gait Training: 8-22 mins   PT G Codes:        Elayne Snare, [place]    Ellouise Newer 06/16/2014, 12:26 PM

## 2014-06-16 NOTE — Progress Notes (Signed)
Patient ID: Alicia [name], female   DOB: [birth year], 80 y.o.   MRN: [id number] Medicine attending progress note: I personally interviewed and examined this lady today together with resident physician Dr. Osa Craver and I concur with her evaluation and management plan. Elderly woman just discharged last week only to  return 24 hours later with intractable vomiting and found to have a partial small bowel obstruction. Of note, there was no clinical suspicion for bowel obstruction at time of the hospitalization. She has responded rapidly to conservative treatment. Etiology of the obstruction likely secondary to adhesions from multiple previous surgeries. At this time nasogastric tube has been removed, she is tolerating solid food, she is passing stool. Her abdomen is soft, not distended, decreased bowel sounds. Anticipate additional 24 hours of observation then home if otherwise stable.  Murriel Hopper, MD, [place]  Hematology-Oncology/Internal Medicine

## 2014-06-17 LAB — BASIC METABOLIC PANEL
ANION GAP: 13 (ref 5–15)
BUN: 14 mg/dL (ref 6–23)
CHLORIDE: 105 meq/L (ref 96–112)
CO2: 24 meq/L (ref 19–32)
Calcium: 8.6 mg/dL (ref 8.4–10.5)
Creatinine, Ser: 1.11 mg/dL — ABNORMAL HIGH (ref 0.50–1.10)
GFR calc Af Amer: 50 mL/min — ABNORMAL LOW (ref >90)
GFR calc non Af Amer: 43 mL/min — ABNORMAL LOW (ref >90)
Glucose, Bld: 100 mg/dL — ABNORMAL HIGH (ref 70–99)
Potassium: 4.1 mEq/L (ref 3.7–5.3)
SODIUM: 142 meq/L (ref 137–147)

## 2014-06-17 NOTE — Clinical Social Work Note (Addendum)
CSW is awaiting bed offers for the pt. CSW will continue to assist and aid with discharge.   Addendum:  CSW has contacted pt's daughter in law [name] several times to discussed bed offers. CSW could not left a voice message on Sue's cell phone due to it being fill. No family at the bedside. CSW will continue to contact [facility].   Addendum:  CSW attempted to call [name] another. CSW informed [name] that [facility] reported that they can not met the pt's need. [name] reported not understanding why these two SNF will not accept the pt. CSW and [name] dicussed other bed offers which were made for the pt. [name] declined those bed offers. [name] reported that she will reach out to [facility] and [facility] to seek a better understand to why they will not accept the pt. CSW is awaiting a call back from [facility].   [place], MSW, [place]

## 2014-06-17 NOTE — Discharge Instructions (Signed)
Thank you for allowing [place] to be involved in your healthcare while you were hospitalized at [facility].   Please note that there have been changes to your home medications.  --> PLEASE LOOK AT YOUR DISCHARGE MEDICATION LIST FOR DETAILS.  Please call your PCP if you have any questions or concerns, or any difficulty getting any of your medications.  Please return to the ER if you have worsening of your symptoms or new severe symptoms arise.  If you have recurrent symptoms of nausea with vomiting, abdominal pain, or no passing of stool or gas, please return to the ED.  Small Bowel Obstruction A small bowel obstruction is a blockage (obstruction) of the small intestine (small bowel). The small bowel is a long, slender tube that connects the stomach to the colon. Its job is to absorb nutrients from the fluids and foods you consume into the bloodstream.  CAUSES  There are many causes of intestinal blockage. The most common ones include:  Hernias. This is a more common cause in children than adults.  Inflammatory bowel disease (enteritis and colitis).  Twisting of the bowel (volvulus).  Tumors.  Scar tissue (adhesions) from previous surgery or radiation treatment.  Recent surgery. This may cause an acute small bowel obstruction called an ileus. SYMPTOMS   Abdominal pain. This may be dull cramps or sharp pain. It may occur in one area or may be present in the entire abdomen. Pain can range from mild to severe, depending on the degree of obstruction.  Nausea and vomiting. Vomit may be greenish or yellow bile color.  Distended or swollen stomach. Abdominal bloating is a common symptom.  Constipation.  Lack of passing gas.  Frequent belching.  Diarrhea. This may occur if runny stool is able to leak around the obstruction. DIAGNOSIS  Your caregiver can usually diagnose small bowel obstruction by taking a history, doing a physical exam, and taking X-rays. If the cause  is unclear, a CT scan (computerized tomography) of your abdomen and pelvis may be needed. TREATMENT  Treatment of the blockage depends on the cause and how bad the problem is.   Sometimes, the obstruction improves with bed rest and intravenous (IV) fluids.  Resting the bowel is very important. This means following a simple diet. Sometimes, a clear liquid diet may be required for several days.  Sometimes, a small tube (nasogastric tube) is placed into the stomach to decompress the bowel. When the bowel is blocked, it usually swells up like a balloon filled with air and fluids. Decompression means that the air and fluids are removed by suction through that tube. This can help with pain, discomfort, and nausea. It can also help the obstruction resolve faster.  Surgery may be required if other treatments do not work. Bowel obstruction from a hernia may require early surgery and can be an emergency procedure. Adhesions that cause frequent or severe obstructions may also require surgery. HOME CARE INSTRUCTIONS If your bowel obstruction is only partial or incomplete, you may be allowed to go home.  Get plenty of rest.  Follow your diet as directed by your caregiver.  Only consume clear liquids until your condition improves.  Avoid solid foods as instructed. SEEK IMMEDIATE MEDICAL CARE IF:  You have increased pain or cramping.  You vomit blood.  You have uncontrolled vomiting or nausea.  You cannot drink fluids due to vomiting or pain.  You develop confusion.  You begin feeling very dry or thirsty (dehydrated).  You have  severe bloating.  You have chills.  You have a fever.  You feel extremely weak or you faint. MAKE SURE YOU:  Understand these instructions.  Will watch your condition.  Will get help right away if you are not doing well or get worse. Document Released: 11/15/2005 Document Revised: 11/21/2011 Document Reviewed: 11/12/2010 [facility] Patient Information 2015  [place], [place]. This information is not intended to replace advice given to you by your health care provider. Make sure you discuss any questions you have with your health care provider.

## 2014-06-17 NOTE — Progress Notes (Signed)
Patient ID: Alicia [name], female   DOB: [birth year], 80 y.o.   MRN: [id number] Medicine attending discharge note: I personally examined this patient on the day of planned discharge. Hospital course, interventions, discharge summary and plans accurate as recorded by resident physician Dr. Osa Craver. Discharge plans discussed in person with the patient's daughter by doctors Warrick Parisian,  and myself.  80 year old woman initially admitted here on September 30 with nonspecific complaints reported by her care giver of generalized weakness and possible chest discomfort. There were no signs of acute myocardial injury. No acute interventions were necessary. She was discharged after 24 hours of observation only to return 24 hours later with acute onset of intractable vomiting. CT scan of the abdomen done in the emergency department consistent with partial small bowel obstruction and she was admitted for further evaluation. She has a history of prior abdominal surgeries with prior hysterectomy for ovarian cancer. No pelvic mass or adenopathy noted on the CT scan. Liver and gallbladder appeared normal. She had mild tenderness on initial exam of the abdomen with moderate distention and hypoactive bowel sounds but no peritoneal signs. She was seen in consultation and followed by general surgery. Conservative management was recommended. Nasogastric tube was placed. Situation improved rapidly. She hated the NG tube and kept pulling it out. (She has known dementia). After 48 hours, the tube was left out. Regular x-rays showed marked improvement compared with admission films. She was started on clear liquids. By 72 hours she had a bowel movement. Diet was advanced. She had no further vomiting. Daughter was very attentive and concerned. She had multiple lengthy conversations with the medical residents who assured her that her mother was receiving appropriate care. It is most likely that her bowel  obstruction was secondary to adhesion formation from prior abdominal surgeries. Her daughter was advised that this could occur again in the future. She will be discharged in stable condition to an extended care facility.  Murriel Hopper, MD, [place]  Hematology-Oncology/Internal Medicine

## 2014-06-17 NOTE — Clinical Social Work Psychosocial (Signed)
Clinical Social Work Department BRIEF PSYCHOSOCIAL ASSESSMENT 06/17/2014  Patient:  Alicia [name], Alicia [name]     Account Number:  [id number]     Admit date:  06/13/2014  Clinical Social Worker:  Marciano Sequin  Date/Time:  06/17/2014 11:03 AM  Referred by:  RN  Date Referred:  06/17/2014 Referred for  SNF Placement   Other Referral:   Interview type:  Family Other interview type:   Pt is not oriented; Pt's daughter in law Alicia [name] at [phone] or [phone]    PSYCHOSOCIAL DATA Living Status:  FAMILY Admitted from facility:   Level of care:   Primary support name:  Alicia [name] Primary support relationship to patient:  FAMILY Degree of support available:   strong    CURRENT CONCERNS  Other Concerns:    SOCIAL WORK ASSESSMENT / PLAN CSW contracted pt's daughter-in-law Alicia [name]. CSW introduce self and purpose of call. CSW and Alicia [name] discussed clinical recommendation for SNF. CSW explained the SNF process to the [facility]. Alicia [name] identified two SNF placements in which the family are interest in. CSW provided Alicia [name] with contact information for further questions. CSW will continue to follow this pt and assist with discharge as needed.   Assessment/plan status:  Information/Referral to [employer] Other assessment/ plan:   Information/referral to community resources:   SNF List    PATIENT'S/FAMILY'S RESPONSE TO PLAN OF CARE: agreed   Alicia [name], MSW, [place]

## 2014-06-17 NOTE — Progress Notes (Signed)
Subjective:  Patient was seen and examined this morning. Patient reports no complaints. Per daughter, no nausea, vomiting, abdominal pain. Patient has been tolerating a regular diet. There is some confusion to whether the patient had 0-2 bowel movements yesterday. The daughter did not witness one. Patient normally has a bowel movement around 10 am each day.  Overnight events: No bowel movement.    Objective: Vital signs in last 24 hours: Filed Vitals:   06/16/14 0957 06/16/14 1300 06/17/14 0506 06/17/14 0605  BP: 141/48 181/68 161/62   Pulse:  72 78   Temp:  98.2 F (36.8 C)  97.4 F (36.3 C)  TempSrc:  Oral  Oral  Resp:  16 20   Height:      Weight:      SpO2:  98% 97%    Weight change:   Intake/Output Summary (Last 24 hours) at 06/17/14 1127 Last data filed at 06/17/14 1032  Gross per 24 hour  Intake   1080 ml  Output    250 ml  Net    830 ml   General: Vital signs reviewed.  Patient is well-developed and well-nourished, in no acute distress and cooperative with exam. OOB to chair. Cardiovascular: RRR, S1 normal, S2 normal, no murmurs, gallops, or rubs. Pulmonary/Chest: Bilateral mild crackles in lower lung fields, no wheezes, or rhonchi. Abdominal: Soft, non-tender, non-distended, BS are normoactive. No tinkling or high-pitched noises. Musculoskeletal: No joint deformities, erythema, or stiffness, ROM full and nontender. Extremities: No lower extremity edema bilaterally,  pulses symmetric and intact bilaterally. No cyanosis or clubbing. Neurological: A&O x 1 Skin: Warm, dry and intact. No rashes or erythema. Psychiatric: Normal mood and affect. speech and behavior is normal. Cognition and memory are abnormal.   Lab Results: Basic Metabolic Panel:  Recent Labs Lab 06/16/14 0620 06/17/14 0440  NA 139 142  K 4.0 4.1  CL 106 105  CO2 24 24  GLUCOSE 97 100*  BUN 13 14  CREATININE 1.05 1.11*  CALCIUM 8.5 8.6   Liver Function Tests:  Recent Labs Lab  06/12/14 0123 06/13/14 0920  AST 21 20  ALT 15 15  ALKPHOS 84 85  BILITOT 0.3 0.4  PROT 7.0 7.2  ALBUMIN 3.4* 3.3*    Recent Labs Lab 06/13/14 0920  LIPASE 21   CBC:  Recent Labs Lab 06/13/14 0920 06/16/14 0620  WBC 6.6 5.3  NEUTROABS 5.0  --   HGB 14.0 11.6*  HCT 41.6 35.2*  MCV 93.1 91.4  PLT 223 189   Cardiac Enzymes:  Recent Labs Lab 06/11/14 1920 06/12/14 0123  TROPONINI <0.30 <0.30   BNP:  Recent Labs Lab 06/11/14 1142  PROBNP 390.1   Urinalysis:  Recent Labs Lab 06/13/14 1048  COLORURINE YELLOW  LABSPEC 1.025  PHURINE 5.0  GLUCOSEU NEGATIVE  HGBUR NEGATIVE  BILIRUBINUR NEGATIVE  KETONESUR 15*  PROTEINUR NEGATIVE  UROBILINOGEN 0.2  NITRITE NEGATIVE  LEUKOCYTESUR NEGATIVE    Micro Results: Recent Results (from the past 240 hour(s))  CLOSTRIDIUM DIFFICILE BY PCR     Status: None   Collection Time    06/13/14  5:38 PM      Result Value Ref Range Status   C difficile by pcr NEGATIVE  NEGATIVE Final   Studies/Results: No results found. Medications: I have reviewed the patient's current medications.  Medications Prior to Admission  Medication Sig Dispense Refill  . Albuterol Sulfate (PROAIR RESPICLICK) 287 (90 BASE) MCG/ACT AEPB Inhale 2 puffs into the lungs every 4 (four) hours  as needed (for shortness of breath).       Marland Kitchen amLODipine (NORVASC) 2.5 MG tablet Take 2.5 mg by mouth daily.      Marland Kitchen aspirin EC 81 MG tablet Take 81 mg by mouth daily.      Marland Kitchen atorvastatin (LIPITOR) 20 MG tablet Take 20 mg by mouth at bedtime.       . calcium carbonate (OS-CAL) 600 MG TABS tablet Take 600 mg by mouth at bedtime.      . famotidine (PEPCID) 20 MG tablet Take 1 tablet (20 mg total) by mouth 2 (two) times daily.  10 tablet  0  . loratadine (CLARITIN) 10 MG tablet Take 10 mg by mouth as needed for allergies.       . Memantine HCl ER (NAMENDA XR) 28 MG CP24 Take 28 mg by mouth daily.  90 capsule  3  . Multiple Vitamin (MULTIVITAMIN WITH MINERALS) TABS  Take 1 tablet by mouth daily.      Marland Kitchen oxybutynin (DITROPAN-XL) 10 MG 24 hr tablet Take 10 mg by mouth daily.      . polyethylene glycol (MIRALAX / GLYCOLAX) packet Take 17 g by mouth daily as needed for mild constipation. With 8 oz of liquid daily.      . promethazine (PHENERGAN) 12.5 MG tablet Take 1 tablet (12.5 mg total) by mouth every 8 (eight) hours as needed for nausea or vomiting.  30 tablet  0  . sertraline (ZOLOFT) 50 MG tablet Take 50 mg by mouth daily.        Scheduled Meds: . amLODipine  2.5 mg Oral Daily  . antiseptic oral rinse  7 mL Mouth Rinse BID  . aspirin EC  81 mg Oral Daily  . atorvastatin  20 mg Oral QHS  . Barium Sulfate  900 mL Oral Once  . calcium carbonate  1,250 mg Oral QHS  . famotidine  20 mg Oral BID  . heparin  5,000 Units Subcutaneous 3 times per day  . lidocaine  15 mL Mouth/Throat Once  . Memantine HCl ER  28 mg Oral Daily  . multivitamin with minerals  1 tablet Oral Daily  . oxybutynin  10 mg Oral Daily  . sertraline  50 mg Oral Daily   Continuous Infusions:   PRN Meds:.acetaminophen, albuterol, fentaNYL, loratadine, ondansetron, polyethylene glycol, promethazine Assessment/Plan:   Partial small bowel obstruction: Resolved. Patient is tolerating a regular diet without nausea or vomiting. Her abdominal exam is benign. Normoactive bowel sounds without high pitched tinkling, no distention or tenderness. Patient has not had a bowel movement today, however this is not keeping her her since clinically the SBO has improved with evidence of improvement on the abdominal xray. Will monitor for BM today.  -Likely discharge to SNF today, awaiting bed offers -PT/OT -Regular Diet -Monitor  Essential hypertension, benign: 160/62 this morning. Daughter-in-law states that patient has a history of hypotensive episode and amlodipine 2.5 mg daily has been the best treatment for her. Will avoid increasing medication. -Amlodipine 2.5 mg daily  GERD: CT abdomen/pelvis  showed Sliding hiatal hernia and the contrast in the distal esophagus may reflect gastroesophageal reflux or esophageal dysmotility. No complaints of nausea or vomiting. -Continue home Famotidine 20 mg BID -Zofran 4 mg Q8H prn nausea  Diabetes mellitus without complication: Diabetes is controlled with diet, last hemoglobin A1C 6.1 on 12/19/13.  -Diet controlled -Regular Diet  Acute on Chronic Kidney Disease:  Resolved. BUN/Cr back at baseline. -Resolved -Regular Diet -Ensure patient is drinking water  Parkinsonian Dementia: Patient is on memantine XR 28 mg and zoloft at home.  -PT/OT -Memantine ER 28 mg  -Zoloft 50 mg daily  DVT/PE ppx: Heparin TID  Dispo: Disposition is deferred at this time, awaiting improvement of current medical problems.  Anticipated discharge in approximately 2 day(s).   The patient does have a current PCP (Lucy Maryan Puls, PA-C) and does not need an [facility] hospital follow-up appointment after discharge.  The patient does not have transportation limitations that hinder transportation to clinic appointments.  .Services Needed at time of discharge: Y = Yes, Blank = No PT:   OT:   RN:   Equipment:   Other:     LOS: 4 days   Osa Craver, MD 06/17/2014, 11:27 AM

## 2014-06-17 NOTE — Clinical Social Work Placement (Addendum)
Clinical Social Work Department CLINICAL SOCIAL WORK PLACEMENT NOTE 06/17/2014  Patient:  Alicia [name], Alicia [name]  Account Number:  [id number] Admit date:  06/13/2014  Clinical Social Worker:  Paulette Blanch Endya Austin, LCSWA  Date/time:  06/17/2014 11:09 AM  Clinical Social Work is seeking post-discharge placement for this patient at the following level of care:   SKILLED NURSING   (*CSW will update this form in Epic as items are completed)   06/17/2014  Patient/family provided with [place] Department of Clinical Social Work's list of facilities offering this level of care within the geographic area requested by the patient (or if unable, by the patient's family).  06/17/2014  Patient/family informed of their freedom to choose among providers that offer the needed level of care, that participate in Medicare, Medicaid or managed care program needed by the patient, have an available bed and are willing to accept the patient.  06/17/2014  Patient/family informed of [facility]' ownership interest in [facility], as well as of the fact that they are under no obligation to receive care at this facility.  PASARR submitted to EDS on  PASARR number received on   FL2 transmitted to all facilities in geographic area requested by pt/family on  06/17/2014 FL2 transmitted to all facilities within larger geographic area on 06/17/2014  Patient informed that his/her managed care company has contracts with or will negotiate with  certain facilities, including the following:     Patient/family informed of bed offers received:  06/17/2014 Patient chooses bed at [facility] Physician recommends and patient chooses bed at    Patient to be transferred to [facility] on 06/18/2014  Patient to be transferred to facility by [facility] Patient and family notified of transfer on 06/18/2014 Name of family member notified:  Gregor Hams   The following physician request were entered in Epic:   Additional  Comments: Pt's has an existing Christian, MSW, [place]

## 2014-06-18 MED ORDER — POLYETHYLENE GLYCOL 3350 17 G PO PACK
17.0000 g | PACK | Freq: Every day | ORAL | Status: DC
  Administered 2014-06-18: 17 g via ORAL
  Filled 2014-06-18: qty 1

## 2014-06-18 MED ORDER — SENNOSIDES-DOCUSATE SODIUM 8.6-50 MG PO TABS
1.0000 | ORAL_TABLET | Freq: Once | ORAL | Status: AC
  Administered 2014-06-18: 1 via ORAL
  Filled 2014-06-18: qty 1

## 2014-06-18 NOTE — Progress Notes (Signed)
Attempted to give report to the nurse Mimi at [facility], [place] stated that pt had already arrived to facility and she was looking over patient's information, she asked for any pertinent information; reported that pt came in for partial bowel obstruction and that pt's last bowel movement was 06/16/14.

## 2014-06-18 NOTE — Progress Notes (Signed)
Subjective:  Patient was seen and examined this morning. Patient was sleeping, but easily aroused. She denied any complaints, no nausea, vomiting, abdominal pain. Last bowel movement was 2 days ago. Patient normally takes daily miralax at home but has not received any during this admission.  Overnight events: No significant events    Objective: Vital signs in last 24 hours: Filed Vitals:   06/17/14 1415 06/17/14 2105 06/18/14 0546 06/18/14 0936  BP: 162/58 147/63 183/52 140/63  Pulse: 76 79 72   Temp: 97.6 F (36.4 C) 98.1 F (36.7 C) 97.5 F (36.4 C)   TempSrc: Oral Oral    Resp: 18 18 16    Height:      Weight:      SpO2: 100% 97% 95%    Weight change:   Intake/Output Summary (Last 24 hours) at 06/18/14 0943 Last data filed at 06/17/14 1900  Gross per 24 hour  Intake    840 ml  Output      0 ml  Net    840 ml   General: Vital signs reviewed.  Patient is well-developed and well-nourished, in no acute distress and cooperative with exam. OOB to chair. Cardiovascular: RRR, S1 normal, S2 normal, no murmurs, gallops, or rubs. Pulmonary/Chest: Bilateral mild crackles in lower lung fields, no wheezes, or rhonchi. Abdominal: Soft, non-tender, non-distended, BS are normoactive. No tinkling or high-pitched noises. Musculoskeletal: No joint deformities, erythema, or stiffness, ROM full and nontender. Extremities: No lower extremity edema bilaterally,  pulses symmetric and intact bilaterally. No cyanosis or clubbing. Neurological: A&O x 1 Skin: Warm, dry and intact. No rashes or erythema. Psychiatric: Normal mood and affect. speech and behavior is normal. Cognition and memory are abnormal.   Medications: I have reviewed the patient's current medications.  Medications Prior to Admission  Medication Sig Dispense Refill  . Albuterol Sulfate (PROAIR RESPICLICK) 932 (90 BASE) MCG/ACT AEPB Inhale 2 puffs into the lungs every 4 (four) hours as needed (for shortness of breath).       Marland Kitchen  amLODipine (NORVASC) 2.5 MG tablet Take 2.5 mg by mouth daily.      Marland Kitchen aspirin EC 81 MG tablet Take 81 mg by mouth daily.      Marland Kitchen atorvastatin (LIPITOR) 20 MG tablet Take 20 mg by mouth at bedtime.       . calcium carbonate (OS-CAL) 600 MG TABS tablet Take 600 mg by mouth at bedtime.      . famotidine (PEPCID) 20 MG tablet Take 1 tablet (20 mg total) by mouth 2 (two) times daily.  10 tablet  0  . loratadine (CLARITIN) 10 MG tablet Take 10 mg by mouth as needed for allergies.       . Memantine HCl ER (NAMENDA XR) 28 MG CP24 Take 28 mg by mouth daily.  90 capsule  3  . Multiple Vitamin (MULTIVITAMIN WITH MINERALS) TABS Take 1 tablet by mouth daily.      Marland Kitchen oxybutynin (DITROPAN-XL) 10 MG 24 hr tablet Take 10 mg by mouth daily.      . polyethylene glycol (MIRALAX / GLYCOLAX) packet Take 17 g by mouth daily as needed for mild constipation. With 8 oz of liquid daily.      . promethazine (PHENERGAN) 12.5 MG tablet Take 1 tablet (12.5 mg total) by mouth every 8 (eight) hours as needed for nausea or vomiting.  30 tablet  0  . sertraline (ZOLOFT) 50 MG tablet Take 50 mg by mouth daily.  Scheduled Meds: . amLODipine  2.5 mg Oral Daily  . antiseptic oral rinse  7 mL Mouth Rinse BID  . aspirin EC  81 mg Oral Daily  . atorvastatin  20 mg Oral QHS  . Barium Sulfate  900 mL Oral Once  . calcium carbonate  1,250 mg Oral QHS  . famotidine  20 mg Oral BID  . heparin  5,000 Units Subcutaneous 3 times per day  . lidocaine  15 mL Mouth/Throat Once  . Memantine HCl ER  28 mg Oral Daily  . multivitamin with minerals  1 tablet Oral Daily  . oxybutynin  10 mg Oral Daily  . polyethylene glycol  17 g Oral Daily  . sertraline  50 mg Oral Daily   Continuous Infusions:   PRN Meds:.acetaminophen, albuterol, fentaNYL, loratadine, ondansetron, polyethylene glycol, promethazine Assessment/Plan:   Partial small bowel obstruction: Resolved. Patient is tolerating a regular diet without nausea or vomiting. No bowel  movement in 2 days, but patient normally takes miralax at home and she has not received any yet. This is more likely due to constipation rather than partial obstruction. We will give miralax and collect stool. -Discharge to SNF today -Regular Diet -Monitor  Essential hypertension, benign: Blood pressure ranging from 147/63 to 183/52 this morning. Will continue home regimen given history of hypotension. Close follow up with PCP, Amlodipine can be considered to be increased per PCP as outpatient. -Amlodipine 2.5 mg daily  GERD: History of sliding hiatal hernia. Stable, no complaints. -Continue home Famotidine 20 mg BID -Zofran 4 mg Q8H prn nausea  Diabetes mellitus without complication: Diabetes is controlled with diet, last hemoglobin A1C 6.1 on 12/19/13.  -Diet controlled -Regular Diet  Acute on Chronic Kidney Disease:  BUN/Cr elevated upon admission, likely secondary to dehydration. Improved, BUN/Cr yesterday 14/1.11.  -Regular Diet -Ensure patient is drinking water  Parkinsonian Dementia: Patient is on memantine XR 28 mg and zoloft at home. Titrated back up to home dose. -Memantine 5 mg BID -Zoloft 50 mg daily  DVT/PE ppx: Heparin TID  Dispo: Disposition is deferred at this time, awaiting improvement of current medical problems.  Anticipated discharge in approximately 2 day(s).   The patient does have a current PCP (Lucy Maryan Puls, PA-C) and does not need an [facility] hospital follow-up appointment after discharge.  The patient does not have transportation limitations that hinder transportation to clinic appointments.  .Services Needed at time of discharge: Y = Yes, Blank = No PT:   OT:   RN:   Equipment:   Other:     LOS: 5 days   Osa Craver, MD 06/18/2014, 9:43 AM

## 2014-06-18 NOTE — Clinical Social Work Note (Signed)
CSW met pt's daughter in law [name] at the bedside. CSW and [name] discussed reason why [place] [facility] [place] decline the pt. Both SNF reported the pt does not meet their rehab requirements since the pt can walk 300 feet. [name] is requesting PT/OT reevaluation. CSW updated MD. MD ordered PT/OT. CSW contact PT/OT asking to specify wheather the pt need rehab or if the pt can return home with home PT. [name] reported the pt's has an home aid.     [place], MSW, [place]

## 2014-06-18 NOTE — Care Management Note (Signed)
  Page 1 of 1   06/18/2014     1:40:14 PM CARE MANAGEMENT NOTE 06/18/2014  Patient:  Alicia [name], Alicia [name]   Account Number:  [id number]  Date Initiated:  06/18/2014  Documentation initiated by:  Magdalen Spatz  Subjective/Objective Assessment:     Action/Plan:   Anticipated DC Date:  06/18/2014   Anticipated DC Plan:  [place]         Choice offered to / List presented to:  C-4 Adult Children        HH arranged  [place].   Status of service:   Medicare Important Message given?  YES (If response is "NO", the following Medicare IM given date fields will be blank) Date Medicare IM given:  06/18/2014 Medicare IM given by:  Magdalen Spatz Date Additional Medicare IM given:   Additional Medicare IM given by:    Discharge Disposition:  [place]  Per UR Regulation:  Reviewed for med. necessity/level of care/duration of stay  If discussed at Long Length of Stay Meetings, dates discussed:    Comments:  06-18-14 Confirmed face sheet information with daughter . They would like [name] from [facility] . Magdalen Spatz RN BSN

## 2014-06-18 NOTE — Progress Notes (Signed)
OT NOTE:  I Met with family along with the physical therapist. Family expressed a great deal of frustration around this hospitalization, other recent hospitalizations and several ED visits at [facility] and she is quite disappointed with the discharge planning with this hospital stay. The daughter in law would like her to go to a SNF and get daily rehab; she is frustrated SNF's are declining the patient because she does not qualify from a functional standpoint. I reviewed the previous PT evaluation note from 10/5 and noted two errors that led to the initial recommendation of SNF. The PT stated the patient reports she lives in a SNF and uses a walker at baseline. He therefor recommended she RETURN to the SNF. If it was known she lived at home with support the recommendation would have been to return home. She has nurses aide assist 4 hours day, otherwise 24 hour supervision from family and her baseline is independent with ambulation without use of walker. Recommend D/C home with prior level of support and HHPT as recommended by the PT. No further OT at this time.  Juliann Pulse, OTR/L [phone]

## 2014-06-18 NOTE — Progress Notes (Signed)
Patient ID: Alicia [name], female   DOB: 1922/12/27, 80 y.o.   MRN: [id number] Medicine attending: I personally examined this patient this morning together with medical resident Dr. Osa Craver and I concur with her evaluation and management plan. We anticipate discharge to extended care facility today. Patient is clinically stable. Murriel Hopper, M.D., [place]

## 2014-06-18 NOTE — Evaluation (Addendum)
Physical Therapy Evaluation Patient Details Name: Alicia [name] MRN: [id number] DOB: 1923/06/04 Today's Date: 06/18/2014   History of Present Illness  80 y.o. female admitted vomitting and found to have small bowel obstruction. PMHx: CAD, HTN, HLD, Parkinsons dementia, Anxiety, depression, diastolic CHF1   Clinical Impression  *Pt admitted with SBO**. Pt currently with functional limitations due to the deficits listed below (see PT Problem List).  Pt will benefit from skilled PT to increase their independence and safety with mobility to allow discharge to the venue listed below.   Pt ambulated 200' x 2 with RW and supervision. She had no loss of balance. She would benefit from HHPT to address her generalized weakness and to progress to baseline of ambulation without assistive device. I had a lengthy discussion with pt's daughter in law [name] who expressed frustration over pt having multiple visits to hospital before she received a correct diagnosis, and that her mother in law doesn't qualify for SNF level of care. Per social work 5 SNF facilities have declined pt due to her high functional level.  I encouraged [name] to encourage [name] to ambulate frequently and that HHPT would instruct the family in home exercises.  **    Follow Up Recommendations Home health PT (Return to SNF due to cognitive deficits)    Equipment Recommendations  None recommended by PT    Recommendations for Other Services       Precautions / Restrictions Precautions Precautions: Fall Restrictions Weight Bearing Restrictions: No      Mobility  Bed Mobility               General bed mobility comments: pt up in recliner  Transfers Overall transfer level: Needs assistance Equipment used: Rolling walker (2 wheeled) Transfers: Sit to/from Stand Sit to Stand: Supervision         General transfer comment: Supervision for safety with VC for hand placement. Good stability upon  standing  Ambulation/Gait Ambulation/Gait assistance: Supervision Ambulation Distance (Feet): 400 Feet (200' x 2 with seated rest break) Assistive device: Rolling walker (2 wheeled) Gait Pattern/deviations: Step-through pattern Gait velocity: WFL   General Gait Details: steady with RW, pt ambulated 10' without RW and was steady but had decreased velocity and stated she felt more steady with RW  Stairs            Wheelchair Mobility    Modified Rankin (Stroke Patients Only)       Balance     Sitting balance-Leahy Scale: Normal       Standing balance-Leahy Scale: Fair                               Pertinent Vitals/Pain Pain Assessment: No/denies pain    Home Living Family/patient expects to be discharged to:: Private residence Living Arrangements: Children Available Help at Discharge: Family;Available 24 hours/day;Personal care attendant Type of Home: House Home Access: Stairs to enter Entrance Stairs-Rails: None Entrance Stairs-Number of Steps: 3 Home Layout: One level Home Equipment: Walker - 2 wheels;Bedside commode      Prior Function     Gait / Transfers Assistance Needed: Per daughter, pt walked without a walker at baseline. Family supervised her for stairs. Family reports pt has had no falls.            Hand Dominance   Dominant Hand: Right    Extremity/Trunk Assessment   Upper Extremity Assessment: Overall WFL for tasks assessed  Lower Extremity Assessment: Overall WFL for tasks assessed      Cervical / Trunk Assessment: Normal  Communication   Communication: No difficulties  Cognition Arousal/Alertness: Awake/alert Behavior During Therapy: WFL for tasks assessed/performed Overall Cognitive Status: History of cognitive impairments - at baseline                      General Comments      Exercises        Assessment/Plan    PT Assessment All further PT needs can be met in the next venue of  care  PT Diagnosis Abnormality of gait;Generalized weakness   PT Problem List Decreased balance;Decreased mobility;Decreased safety awareness  PT Treatment Interventions     PT Goals (Current goals can be found in the Care Plan section) Acute Rehab PT Goals Patient Stated Goal: Pt unable to state due to cogntive pre-exisisting cognitive impairments, daughter in law stated pt is below her baseline which is walking without a walker and is generally weaker than baseline.  PT Goal Formulation: With family    Frequency     Barriers to discharge        Co-evaluation               End of Session Equipment Utilized During Treatment: Gait belt Activity Tolerance: Patient tolerated treatment well Patient left: in chair;with call bell/phone within reach;with chair alarm set Nurse Communication: Mobility status         Time: 2446-9507 PT Time Calculation (min): 116 min   Charges:   PT Evaluation $Initial PT Evaluation Tier I: 1 Procedure PT Treatments $Gait Training: 8-22 mins   PT G Codes:          Philomena Doheny 06/18/2014, 12:54 PM [phone]

## 2014-06-19 ENCOUNTER — Emergency Department (HOSPITAL_COMMUNITY): Payer: Medicare Other

## 2014-06-19 ENCOUNTER — Emergency Department (HOSPITAL_COMMUNITY)
Admission: EM | Admit: 2014-06-19 | Discharge: 2014-06-20 | Disposition: A | Discharge status: Home / Self Care | Payer: Medicare Other | Attending: Emergency Medicine | Admitting: Emergency Medicine

## 2014-06-19 ENCOUNTER — Encounter (HOSPITAL_COMMUNITY): Payer: Self-pay | Admitting: Emergency Medicine

## 2014-06-19 DIAGNOSIS — Z8781 Personal history of (healed) traumatic fracture: Secondary | ICD-10-CM | POA: Insufficient documentation

## 2014-06-19 DIAGNOSIS — F0390 Unspecified dementia without behavioral disturbance: Secondary | ICD-10-CM | POA: Diagnosis not present

## 2014-06-19 DIAGNOSIS — E119 Type 2 diabetes mellitus without complications: Secondary | ICD-10-CM | POA: Diagnosis not present

## 2014-06-19 DIAGNOSIS — F329 Major depressive disorder, single episode, unspecified: Secondary | ICD-10-CM | POA: Insufficient documentation

## 2014-06-19 DIAGNOSIS — F419 Anxiety disorder, unspecified: Secondary | ICD-10-CM | POA: Insufficient documentation

## 2014-06-19 DIAGNOSIS — Z90722 Acquired absence of ovaries, bilateral: Secondary | ICD-10-CM | POA: Insufficient documentation

## 2014-06-19 DIAGNOSIS — Z8543 Personal history of malignant neoplasm of ovary: Secondary | ICD-10-CM | POA: Diagnosis not present

## 2014-06-19 DIAGNOSIS — Z87448 Personal history of other diseases of urinary system: Secondary | ICD-10-CM | POA: Insufficient documentation

## 2014-06-19 DIAGNOSIS — I10 Essential (primary) hypertension: Secondary | ICD-10-CM | POA: Diagnosis not present

## 2014-06-19 DIAGNOSIS — Z9089 Acquired absence of other organs: Secondary | ICD-10-CM | POA: Diagnosis not present

## 2014-06-19 DIAGNOSIS — R112 Nausea with vomiting, unspecified: Secondary | ICD-10-CM | POA: Diagnosis present

## 2014-06-19 DIAGNOSIS — K5900 Constipation, unspecified: Secondary | ICD-10-CM | POA: Diagnosis not present

## 2014-06-19 LAB — CBC WITH DIFFERENTIAL/PLATELET
Basophils Absolute: 0 10*3/uL (ref 0.0–0.1)
Basophils Relative: 0 % (ref 0–1)
Eosinophils Absolute: 0 10*3/uL (ref 0.0–0.7)
Eosinophils Relative: 0 % (ref 0–5)
HCT: 38.4 % (ref 36.0–46.0)
Hemoglobin: 13.3 g/dL (ref 12.0–15.0)
LYMPHS ABS: 0.8 10*3/uL (ref 0.7–4.0)
Lymphocytes Relative: 7 % — ABNORMAL LOW (ref 12–46)
MCH: 31.8 pg (ref 26.0–34.0)
MCHC: 34.6 g/dL (ref 30.0–36.0)
MCV: 91.9 fL (ref 78.0–100.0)
MONO ABS: 1 10*3/uL (ref 0.1–1.0)
Monocytes Relative: 9 % (ref 3–12)
NRBC: 0 /100{WBCs}
Neutro Abs: 9.2 10*3/uL — ABNORMAL HIGH (ref 1.7–7.7)
Neutrophils Relative %: 84 % — ABNORMAL HIGH (ref 43–77)
Platelets: 262 10*3/uL (ref 150–400)
RBC: 4.18 MIL/uL (ref 3.87–5.11)
RDW: 14.2 % (ref 11.5–15.5)
WBC: 11 10*3/uL — ABNORMAL HIGH (ref 4.0–10.5)

## 2014-06-19 LAB — COMPREHENSIVE METABOLIC PANEL
ALK PHOS: 84 U/L (ref 39–117)
ALT: 66 U/L — ABNORMAL HIGH (ref 0–35)
ANION GAP: 12 (ref 5–15)
AST: 51 U/L — ABNORMAL HIGH (ref 0–37)
Albumin: 3.3 g/dL — ABNORMAL LOW (ref 3.5–5.2)
BILIRUBIN TOTAL: 0.2 mg/dL — AB (ref 0.3–1.2)
BUN: 25 mg/dL — AB (ref 6–23)
CO2: 25 mEq/L (ref 19–32)
Calcium: 9.4 mg/dL (ref 8.4–10.5)
Chloride: 98 mEq/L (ref 96–112)
Creatinine, Ser: 1.42 mg/dL — ABNORMAL HIGH (ref 0.50–1.10)
GFR, EST AFRICAN AMERICAN: 37 mL/min — AB (ref >90)
GFR, EST NON AFRICAN AMERICAN: 32 mL/min — AB (ref >90)
GLUCOSE: 118 mg/dL — AB (ref 70–99)
Potassium: 5 mEq/L (ref 3.7–5.3)
Sodium: 135 mEq/L — ABNORMAL LOW (ref 137–147)
Total Protein: 6.8 g/dL (ref 6.0–8.3)

## 2014-06-19 LAB — URINALYSIS, ROUTINE W REFLEX MICROSCOPIC
Bilirubin Urine: NEGATIVE
Glucose, UA: NEGATIVE mg/dL
Hgb urine dipstick: NEGATIVE
Ketones, ur: NEGATIVE mg/dL
Leukocytes, UA: NEGATIVE
Nitrite: NEGATIVE
Protein, ur: NEGATIVE mg/dL
Specific Gravity, Urine: 1.016 (ref 1.005–1.030)
Urobilinogen, UA: 0.2 mg/dL (ref 0.0–1.0)
pH: 6 (ref 5.0–8.0)

## 2014-06-19 LAB — LIPASE, BLOOD: Lipase: 31 U/L (ref 11–59)

## 2014-06-19 LAB — I-STAT CG4 LACTIC ACID, ED: LACTIC ACID, VENOUS: 0.76 mmol/L (ref 0.5–2.2)

## 2014-06-19 NOTE — ED Notes (Signed)
Pt to ED from [facility] via [place] c/o nausea and vomiting. Denies pain at this time. Was seen yesterday for similar symptoms,dx with partial small bowel obstruction. Nursing facility staff reports patient has unable to keep liquids or foods down. Pt hx of dementia

## 2014-06-19 NOTE — ED Notes (Signed)
Dr.Carr at bedside; Pt c/o nausea/vomiting. Was seen one week ago for partial bowel obstruction. Distention noted to R side of abdomen. Bowel sounds present in all quadrants

## 2014-06-19 NOTE — ED Provider Notes (Signed)
CSN: [id number]     Arrival date & time 06/19/14  1902 History   First MD Initiated Contact with Patient 06/19/14 1904     Chief Complaint  Patient presents with  . Nausea  . Emesis     (Consider location/radiation/quality/duration/timing/severity/associated sxs/prior Treatment) Patient is a 80 y.o. female presenting with vomiting.  Emesis Severity:  Moderate Duration:  1 day Timing:  Unable to specify Number of daily episodes:  2 Quality:  Undigested food Able to tolerate:  Liquids and solids Progression:  Unchanged Chronicity:  Recurrent Relieved by:  None tried Worsened by:  Nothing tried Ineffective treatments:  None tried Associated symptoms: no fever   Risk factors: prior abdominal surgery     Past Medical History  Diagnosis Date  . Dementia     significant  . Renal disease   . Memory loss 12/10/2012  . Depression 12/10/2012  . Anxiety state, unspecified 12/10/2012  . Spells 12/10/2012  . Essential hypertension, benign 12/10/2012  . Other and unspecified hyperlipidemia 12/10/2012  . Loss of weight 12/10/2012  . Carotid artery stenosis   . Ovarian cancer     treated with surgery & chemo  . Ankle fracture, right   . Unstable gait     mild  . Syncope 12/10/2012    Recurrent, sporadic episodes  . Carotid artery disease   . Parkinson disease   . Diabetes mellitus without complication    Past Surgical History  Procedure Laterality Date  . Vesicovaginal fistula closure w/ tah  2005  . Oophorectomy  2007    for ovarian cancer  . Prolapsed uterine fibroid ligation    . Appendectomy    . Cataract extraction Bilateral   . Implantable loop recorder  11/14    MDT LINQ implanted for recurrent unexplained syncope by Dr Rayann Heman  . Abdominal hysterectomy    . Ankle surgery Left    Family History  Problem Relation Age of Onset  . Cancer Mother     breast  . Stroke Father    History  Substance Use Topics  . Smoking status: Never Smoker   . Smokeless tobacco: Never  Used  . Alcohol Use: No   OB History   Grav Para Term Preterm Abortions TAB SAB Ect Mult Living                 Review of Systems  Unable to perform ROS: Dementia  Gastrointestinal: Positive for vomiting.      Allergies  Iohexol; Ativan; Ciprofloxacin; Nsaids; Sulfa antibiotics; and Ivp dye  Home Medications   Prior to Admission medications   Medication Sig Start Date End Date Taking? Authorizing Provider  Albuterol Sulfate (PROAIR RESPICLICK) 833 (90 BASE) MCG/ACT AEPB Inhale 2 puffs into the lungs every 4 (four) hours as needed (for shortness of breath).  05/08/14  Yes Historical Provider, MD  amLODipine (NORVASC) 2.5 MG tablet Take 2.5 mg by mouth daily.   Yes Historical Provider, MD  aspirin EC 81 MG tablet Take 81 mg by mouth daily.   Yes Historical Provider, MD  atorvastatin (LIPITOR) 20 MG tablet Take 20 mg by mouth at bedtime.    Yes Historical Provider, MD  calcium carbonate (OS-CAL) 600 MG TABS tablet Take 600 mg by mouth at bedtime.   Yes Historical Provider, MD  famotidine (PEPCID) 20 MG tablet Take 1 tablet (20 mg total) by mouth 2 (two) times daily. 05/29/14  Yes Charlesetta Shanks, MD  loratadine (CLARITIN) 10 MG tablet Take 10 mg by mouth  as needed for allergies.  11/30/13  Yes Historical Provider, MD  Memantine HCl ER (NAMENDA XR) 28 MG CP24 Take 28 mg by mouth daily. 04/02/14  Yes Star Age, MD  Multiple Vitamin (MULTIVITAMIN WITH MINERALS) TABS Take 1 tablet by mouth daily.   Yes Historical Provider, MD  oxybutynin (DITROPAN-XL) 10 MG 24 hr tablet Take 10 mg by mouth daily.   Yes Historical Provider, MD  polyethylene glycol (MIRALAX / GLYCOLAX) packet Take 17 g by mouth daily as needed for mild constipation. With 8 oz of liquid daily. 12/24/13  Yes Eugenie Filler, MD  promethazine (PHENERGAN) 12.5 MG tablet Take 1 tablet (12.5 mg total) by mouth every 8 (eight) hours as needed for nausea or vomiting. 06/12/14  Yes Alexa Marvel Plan, MD  sertraline (ZOLOFT) 50 MG tablet  Take 50 mg by mouth daily.   Yes Historical Provider, MD  sodium phosphate (FLEET) 7-19 GM/118ML ENEM Place 133 mLs (1 enema total) rectally daily as needed for severe constipation. 06/20/14   Renne Musca, MD   BP 151/59  Pulse 92  Temp(Src) 98.1 F (36.7 C)  Resp 16  SpO2 95% Physical Exam  Vitals reviewed. Constitutional: She appears well-developed and well-nourished. No distress.  HENT:  Head: Normocephalic and atraumatic.  Eyes: EOM are normal.  Neck: Normal range of motion.  Cardiovascular: Normal rate, regular rhythm and normal heart sounds.   No murmur heard. Pulmonary/Chest: Effort normal and breath sounds normal. No respiratory distress. She has no wheezes.  Abdominal: Soft. Bowel sounds are normal. She exhibits distension. There is no tenderness. There is no rebound and no guarding.  Genitourinary:  Soft stool in the rectal vault  Musculoskeletal: She exhibits no edema.  Neurological: She is alert.  Skin: She is not diaphoretic.    ED Course  Procedures (including critical care time) Labs Review Labs Reviewed  CBC WITH DIFFERENTIAL - Abnormal; Notable for the following:    WBC 11.0 (*)    Neutrophils Relative % 84 (*)    Lymphocytes Relative 7 (*)    Neutro Abs 9.2 (*)    All other components within normal limits  COMPREHENSIVE METABOLIC PANEL - Abnormal; Notable for the following:    Sodium 135 (*)    Glucose, Bld 118 (*)    BUN 25 (*)    Creatinine, Ser 1.42 (*)    Albumin 3.3 (*)    AST 51 (*)    ALT 66 (*)    Total Bilirubin 0.2 (*)    GFR calc non Af Amer 32 (*)    GFR calc Af Amer 37 (*)    All other components within normal limits  LIPASE, BLOOD  URINALYSIS, ROUTINE W REFLEX MICROSCOPIC  I-STAT CG4 LACTIC ACID, ED    Imaging Review Ct Abdomen Pelvis Wo Contrast  06/19/2014   CLINICAL DATA:  Acute onset of nausea and vomiting. Initial encounter.  EXAM: CT ABDOMEN AND PELVIS WITHOUT CONTRAST  TECHNIQUE: Multidetector CT imaging of the abdomen and  pelvis was performed following the standard protocol without IV contrast.  COMPARISON:  CT of the abdomen and pelvis performed 06/13/2014  FINDINGS: Bibasilar atelectasis is noted. A moderate hiatal hernia is seen, filled with contrast. Diffuse coronary artery calcifications are seen. Calcification is noted at the aortic valve.  The liver and spleen are unremarkable in appearance. The gallbladder is within normal limits. The pancreas and adrenal glands are unremarkable.  Note is made of mild to moderate right-sided hydronephrosis, new from the recent prior study, with  distention of the right ureter to the level of a clip anterior to the right psoas muscle. This may reflect focal stricture, as no definite obstructing stone is seen. The more distal ureter is decompressed.  The left kidney is unremarkable in appearance. Mild nonspecific left-sided perinephric stranding is seen. No renal or ureteral stones are identified.  No free fluid is identified. The small bowel is unremarkable in appearance. The stomach is within normal limits. No acute vascular abnormalities are seen. Scattered calcification is seen along the abdominal aorta and its branches.  The patient is status post appendectomy. Scattered diverticulosis is noted along the distal transverse, descending and proximal sigmoid colon, without evidence of diverticulitis. The rectum is distended to 7.9 cm with dense stool, raising concern for mild fecal impaction.  The bladder is mildly distended and grossly unremarkable in appearance. The patient is status post hysterectomy. No suspicious adnexal masses are seen. No inguinal lymphadenopathy is seen.  No acute osseous abnormalities are identified.  IMPRESSION: 1. Acute onset of mild to moderate right-sided hydronephrosis, new from the recent prior study, with distention of the right ureter to the level of a clip anterior to the right psoas muscle. This may reflect focal stricture adjacent to site of prior surgery,  as no definite obstructing stone is seen. 2. Rectum distended to 7.9 cm in diameter, raising concern for mild fecal impaction. This new from the recent prior study. 3. Moderate hiatal hernia, filled with contrast. 4. Diffuse coronary artery calcifications seen. Calcification at the aortic valve. 5. Bibasilar atelectasis noted. 6. Scattered calcification along the abdominal aorta and its branches. 7. Scattered diverticulosis along the distal transverse, descending and proximal sigmoid colon, without evidence of diverticulitis.   Electronically Signed   By: Garald Balding M.D.   On: 06/19/2014 22:19      MDM   Final diagnoses:  Constipation, unspecified constipation type    Patient is an 80 year old female with history of severe dementia and recent small bowel obstruction that presents with emesis and abdominal pain. On exam the patient's abdomen is large however nontender to palpation. AFVSS. Rectal exam significant for soft stool in the rectal vault. Imaging significant for a large amount of stool without the evidence of small bowel obstruction. Patient's labs are unremarkable. We will send the patient back to her nursing facility with a fleets enema.    Renne Musca, MD 06/20/14 0027   This patient was seen in conjunction with the resident physician, Dr. Robina Ade.  The documentation accurately reflects the patient's encounter in the ED.  On my exam, this elderly lady with dementia was in no distress, with soft, nontender no, the large abdomen. Given the patient's history of prior bowel obstruction, CT scan was performed.  This did not demonstrate an acute obstruction, and the patient did not have sustained vomiting, nor evidence for decompensation her. Given the presence of soft stool in the rectal vault, patient was started on a bowel regimen. Patient is a resident of a [place] facility, was discharged in stable condition to that location.   Carmin Muskrat, MD 06/24/14 [phone]

## 2014-06-19 NOTE — ED Notes (Signed)
CT made aware that pt finished contrast.

## 2014-06-20 DIAGNOSIS — K5900 Constipation, unspecified: Secondary | ICD-10-CM | POA: Diagnosis not present

## 2014-06-20 MED ORDER — ONDANSETRON 4 MG PO TBDP
4.0000 mg | ORAL_TABLET | Freq: Once | ORAL | Status: AC
  Administered 2014-06-20: 4 mg via ORAL

## 2014-06-20 MED ORDER — FLEET ENEMA 7-19 GM/118ML RE ENEM: 1.0000 | ENEMA | Freq: Every day | RECTAL | Status: DC | PRN

## 2014-06-20 MED ORDER — ONDANSETRON 4 MG PO TBDP
ORAL_TABLET | ORAL | Status: AC
  Filled 2014-06-20: qty 1

## 2014-06-20 NOTE — Discharge Instructions (Signed)

## 2014-08-06 ENCOUNTER — Encounter: Payer: Self-pay | Admitting: Neurology

## 2014-08-06 ENCOUNTER — Encounter: Payer: Self-pay | Admitting: *Deleted

## 2014-08-06 ENCOUNTER — Telehealth: Payer: Self-pay | Admitting: Neurology

## 2014-08-06 NOTE — Telephone Encounter (Signed)
Mailed new appointment time letter to patient , was unable to reach by phone

## 2014-08-10 ENCOUNTER — Emergency Department (HOSPITAL_BASED_OUTPATIENT_CLINIC_OR_DEPARTMENT_OTHER): Payer: Medicare Other

## 2014-08-10 ENCOUNTER — Emergency Department (HOSPITAL_BASED_OUTPATIENT_CLINIC_OR_DEPARTMENT_OTHER)
Admission: EM | Admit: 2014-08-10 | Discharge: 2014-08-11 | Disposition: A | Discharge status: Home / Self Care | Payer: Medicare Other | Attending: Emergency Medicine | Admitting: Emergency Medicine

## 2014-08-10 ENCOUNTER — Encounter (HOSPITAL_BASED_OUTPATIENT_CLINIC_OR_DEPARTMENT_OTHER): Payer: Self-pay

## 2014-08-10 DIAGNOSIS — E785 Hyperlipidemia, unspecified: Secondary | ICD-10-CM | POA: Diagnosis not present

## 2014-08-10 DIAGNOSIS — Z8781 Personal history of (healed) traumatic fracture: Secondary | ICD-10-CM | POA: Insufficient documentation

## 2014-08-10 DIAGNOSIS — E119 Type 2 diabetes mellitus without complications: Secondary | ICD-10-CM | POA: Insufficient documentation

## 2014-08-10 DIAGNOSIS — N390 Urinary tract infection, site not specified: Secondary | ICD-10-CM | POA: Insufficient documentation

## 2014-08-10 DIAGNOSIS — F419 Anxiety disorder, unspecified: Secondary | ICD-10-CM | POA: Diagnosis not present

## 2014-08-10 DIAGNOSIS — R011 Cardiac murmur, unspecified: Secondary | ICD-10-CM | POA: Insufficient documentation

## 2014-08-10 DIAGNOSIS — I10 Essential (primary) hypertension: Secondary | ICD-10-CM | POA: Diagnosis not present

## 2014-08-10 DIAGNOSIS — I2510 Atherosclerotic heart disease of native coronary artery without angina pectoris: Secondary | ICD-10-CM | POA: Diagnosis not present

## 2014-08-10 DIAGNOSIS — R55 Syncope and collapse: Secondary | ICD-10-CM

## 2014-08-10 DIAGNOSIS — Z9889 Other specified postprocedural states: Secondary | ICD-10-CM | POA: Insufficient documentation

## 2014-08-10 DIAGNOSIS — F329 Major depressive disorder, single episode, unspecified: Secondary | ICD-10-CM | POA: Diagnosis not present

## 2014-08-10 DIAGNOSIS — Z8543 Personal history of malignant neoplasm of ovary: Secondary | ICD-10-CM | POA: Diagnosis not present

## 2014-08-10 DIAGNOSIS — R609 Edema, unspecified: Secondary | ICD-10-CM | POA: Insufficient documentation

## 2014-08-10 DIAGNOSIS — Z7982 Long term (current) use of aspirin: Secondary | ICD-10-CM | POA: Insufficient documentation

## 2014-08-10 DIAGNOSIS — Z79899 Other long term (current) drug therapy: Secondary | ICD-10-CM | POA: Insufficient documentation

## 2014-08-10 DIAGNOSIS — G20 Parkinson's disease: Secondary | ICD-10-CM | POA: Insufficient documentation

## 2014-08-10 LAB — URINALYSIS, ROUTINE W REFLEX MICROSCOPIC
Bilirubin Urine: NEGATIVE
Glucose, UA: NEGATIVE mg/dL
Hgb urine dipstick: NEGATIVE
Ketones, ur: 15 mg/dL — AB
Nitrite: POSITIVE — AB
Protein, ur: NEGATIVE mg/dL
SPECIFIC GRAVITY, URINE: 1.019 (ref 1.005–1.030)
UROBILINOGEN UA: 0.2 mg/dL (ref 0.0–1.0)
pH: 5 (ref 5.0–8.0)

## 2014-08-10 LAB — BASIC METABOLIC PANEL
Anion gap: 15 (ref 5–15)
BUN: 36 mg/dL — ABNORMAL HIGH (ref 6–23)
CHLORIDE: 98 meq/L (ref 96–112)
CO2: 24 meq/L (ref 19–32)
Calcium: 9.6 mg/dL (ref 8.4–10.5)
Creatinine, Ser: 1.4 mg/dL — ABNORMAL HIGH (ref 0.50–1.10)
GFR calc non Af Amer: 32 mL/min — ABNORMAL LOW (ref >90)
GFR, EST AFRICAN AMERICAN: 37 mL/min — AB (ref >90)
Glucose, Bld: 133 mg/dL — ABNORMAL HIGH (ref 70–99)
POTASSIUM: 4.5 meq/L (ref 3.7–5.3)
SODIUM: 137 meq/L (ref 137–147)

## 2014-08-10 LAB — CBC WITH DIFFERENTIAL/PLATELET
BASOS PCT: 0 % (ref 0–1)
Basophils Absolute: 0 10*3/uL (ref 0.0–0.1)
Eosinophils Absolute: 0.1 10*3/uL (ref 0.0–0.7)
Eosinophils Relative: 2 % (ref 0–5)
HCT: 39.2 % (ref 36.0–46.0)
HEMOGLOBIN: 12.6 g/dL (ref 12.0–15.0)
LYMPHS ABS: 1.1 10*3/uL (ref 0.7–4.0)
Lymphocytes Relative: 17 % (ref 12–46)
MCH: 29.8 pg (ref 26.0–34.0)
MCHC: 32.1 g/dL (ref 30.0–36.0)
MCV: 92.7 fL (ref 78.0–100.0)
Monocytes Absolute: 0.6 10*3/uL (ref 0.1–1.0)
Monocytes Relative: 9 % (ref 3–12)
NEUTROS PCT: 72 % (ref 43–77)
Neutro Abs: 4.5 10*3/uL (ref 1.7–7.7)
Platelets: 242 10*3/uL (ref 150–400)
RBC: 4.23 MIL/uL (ref 3.87–5.11)
RDW: 14 % (ref 11.5–15.5)
WBC: 6.2 10*3/uL (ref 4.0–10.5)

## 2014-08-10 LAB — TROPONIN I

## 2014-08-10 LAB — URINE MICROSCOPIC-ADD ON

## 2014-08-10 NOTE — ED Provider Notes (Signed)
CSN: [id number]     Arrival date & time 08/10/14  2049 History  This chart was scribed for Quintella Reichert, MD by Peyton Bottoms, ED Scribe. This patient was seen in room MH04/MH04 and the patient's care was started at 8:52 PM.   Chief Complaint  Patient presents with  . Loss of Consciousness   Patient is a 80 y.o. female presenting with syncope. The history is provided by the patient and the EMS personnel. No language interpreter was used.  Loss of Consciousness Episode history:  Single Most recent episode:  Today Timing:  Sporadic Progression:  Unchanged Chronicity:  Recurrent Witnessed: yes   Relieved by:  Nothing Worsened by:  Nothing tried Ineffective treatments:  None tried  HPI Comments: Alicia [name] is a 80 y.o. female with a history of dementia, diabetes, hypertension, renal disorder, anxiety, sporadic episodes of syncope, CAD, Parkinson disease, who presents to the Emergency Department complaining of syncopal episode that occurred earlier today while patient was in church. She denies associated Cp,dizziness, weakness.  2345: Additional history now available from daughter-in-law. Patient has a history of recurrent syncope with 7 prior episodes and multiple prior workups. Patient was in church today when daughter looked over and patient was staring forward and not responding. This happened for about 30-40 seconds and then she was back at her baseline. Patient has a history of dementia and she is confused at baseline but she is ambulatory. There were no preceding symptoms. Family denies recent illness, but she was discharged from a nursing home approximately 5 or 6 days ago.  Past Medical History  Diagnosis Date  . Dementia     significant  . Renal disease   . Memory loss 12/10/2012  . Depression 12/10/2012  . Anxiety state, unspecified 12/10/2012  . Spells 12/10/2012  . Essential hypertension, benign 12/10/2012  . Other and unspecified hyperlipidemia 12/10/2012  . Loss of  weight 12/10/2012  . Carotid artery stenosis   . Ovarian cancer     treated with surgery & chemo  . Ankle fracture, right   . Unstable gait     mild  . Syncope 12/10/2012    Recurrent, sporadic episodes  . Carotid artery disease   . Parkinson disease   . Diabetes mellitus without complication    Past Surgical History  Procedure Laterality Date  . Vesicovaginal fistula closure w/ tah  2005  . Oophorectomy  2007    for ovarian cancer  . Prolapsed uterine fibroid ligation    . Appendectomy    . Cataract extraction Bilateral   . Implantable loop recorder  11/14    MDT LINQ implanted for recurrent unexplained syncope by Dr Rayann Heman  . Abdominal hysterectomy    . Ankle surgery Left    Family History  Problem Relation Age of Onset  . Cancer Mother     breast  . Stroke Father    History  Substance Use Topics  . Smoking status: Never Smoker   . Smokeless tobacco: Never Used  . Alcohol Use: No   OB History    No data available     Review of Systems  Cardiovascular: Positive for syncope.  Neurological: Positive for syncope.  All other systems reviewed and are negative.  Allergies  Iohexol; Ativan; Ciprofloxacin; Nsaids; Sulfa antibiotics; and Ivp dye  Home Medications   Prior to Admission medications   Medication Sig Start Date End Date Taking? Authorizing Provider  Albuterol Sulfate (PROAIR RESPICLICK) 409 (90 BASE) MCG/ACT AEPB Inhale 2 puffs into  the lungs every 4 (four) hours as needed (for shortness of breath).  05/08/14   Historical Provider, MD  amLODipine (NORVASC) 2.5 MG tablet Take 2.5 mg by mouth daily.    Historical Provider, MD  aspirin EC 81 MG tablet Take 81 mg by mouth daily.    Historical Provider, MD  atorvastatin (LIPITOR) 20 MG tablet Take 20 mg by mouth at bedtime.     Historical Provider, MD  calcium carbonate (OS-CAL) 600 MG TABS tablet Take 600 mg by mouth at bedtime.    Historical Provider, MD  famotidine (PEPCID) 20 MG tablet Take 1 tablet (20 mg  total) by mouth 2 (two) times daily. 05/29/14   Charlesetta Shanks, MD  loratadine (CLARITIN) 10 MG tablet Take 10 mg by mouth as needed for allergies.  11/30/13   Historical Provider, MD  Memantine HCl ER (NAMENDA XR) 28 MG CP24 Take 28 mg by mouth daily. 04/02/14   Star Age, MD  Multiple Vitamin (MULTIVITAMIN WITH MINERALS) TABS Take 1 tablet by mouth daily.    Historical Provider, MD  oxybutynin (DITROPAN-XL) 10 MG 24 hr tablet Take 10 mg by mouth daily.    Historical Provider, MD  polyethylene glycol (MIRALAX / GLYCOLAX) packet Take 17 g by mouth daily as needed for mild constipation. With 8 oz of liquid daily. 12/24/13   Eugenie Filler, MD  promethazine (PHENERGAN) 12.5 MG tablet Take 1 tablet (12.5 mg total) by mouth every 8 (eight) hours as needed for nausea or vomiting. 06/12/14   Osa Craver, MD  sertraline (ZOLOFT) 50 MG tablet Take 50 mg by mouth daily.    Historical Provider, MD  sodium phosphate (FLEET) 7-19 GM/118ML ENEM Place 133 mLs (1 enema total) rectally daily as needed for severe constipation. 06/20/14   Renne Musca, MD   Triage Vitals: BP 147/71 mmHg  Pulse 74  Temp(Src) 98 F (36.7 C) (Oral)  Resp 20  Ht 5\' 2"  (1.575 m)  Wt 120 lb (54.432 kg)  BMI 21.94 kg/m2  SpO2 98%  Physical Exam  Constitutional: She is oriented to person, place, and time. She appears well-developed and well-nourished.  HENT:  Head: Normocephalic and atraumatic.  Eyes: Pupils are equal, round, and reactive to light.  Cardiovascular: Normal rate and regular rhythm.   Murmur heard. Faint systolic ejection murmur  Pulmonary/Chest: Effort normal and breath sounds normal. No respiratory distress.  Decreased movement in the bases  Abdominal: Soft. There is no tenderness. There is no rebound and no guarding.  Musculoskeletal: She exhibits edema. She exhibits no tenderness.  Trace pitting edema  Neurological: She is alert and oriented to person, place, and time.  Skin: Skin is warm and dry.   Psychiatric: She has a normal mood and affect. Her behavior is normal.  Nursing note and vitals reviewed.  ED Course  Procedures (including critical care time)  DIAGNOSTIC STUDIES: Oxygen Saturation is 98% on RA, normal by my interpretation.    COORDINATION OF CARE: 9:05 PM- Discussed plans to order diagnostic EKG. Pt advised of plan for treatment and pt agrees.  Labs Review Labs Reviewed  BASIC METABOLIC PANEL - Abnormal; Notable for the following:    Glucose, Bld 133 (*)    BUN 36 (*)    Creatinine, Ser 1.40 (*)    GFR calc non Af Amer 32 (*)    GFR calc Af Amer 37 (*)    All other components within normal limits  URINALYSIS, ROUTINE W REFLEX MICROSCOPIC - Abnormal; Notable for the following:  Ketones, ur 15 (*)    Nitrite POSITIVE (*)    Leukocytes, UA MODERATE (*)    All other components within normal limits  URINE MICROSCOPIC-ADD ON - Abnormal; Notable for the following:    Squamous Epithelial / LPF FEW (*)    Bacteria, UA MANY (*)    Casts HYALINE CASTS (*)    All other components within normal limits  URINE CULTURE  CBC WITH DIFFERENTIAL  TROPONIN I    Imaging Review Dg Chest Port 1 View  08/10/2014   CLINICAL DATA:  Acute onset of syncope. Confusion. Initial encounter.  EXAM: PORTABLE CHEST - 1 VIEW  COMPARISON:  Chest radiograph performed 06/15/2014  FINDINGS: The lungs are mildly hypoexpanded. Mild left basilar airspace opacity likely reflects atelectasis. There is no evidence of pleural effusion or pneumothorax.  The cardiomediastinal silhouette is borderline normal in size. No acute osseous abnormalities are seen.  IMPRESSION: Lungs mildly hypoexpanded. Mild left basilar airspace opacity likely reflects atelectasis.   Electronically Signed   By: Garald Balding M.D.   On: 08/10/2014 21:42     EKG Interpretation None      [facility] is not available to enter EKG. EKG was normal sinus rhythm rate of 72, right bundle branch block, normal axis. MDM   Final  diagnoses:  Acute UTI  Syncope, unspecified syncope type    Patient here for evaluation of syncope, she has a history of multiple syncopal episodes in the past with prior cardiac evaluation. Patient is nontoxic on exam with no complaints of emergency department. She is at her baseline mental status. UA is consistent with UTI. BMP with renal insufficiency which is similar to priors. There is one documented pulse ox of 83% which was documented in error, patient had a pulse ox of 93%. Patient able to ambulate and department at her baseline. Discussed with family care and return precautions.  I personally performed the services described in this documentation, which was scribed in my presence. The recorded information has been reviewed and is accurate.  Quintella Reichert, MD 08/11/14 [phone]

## 2014-08-10 NOTE — ED Notes (Signed)
No changes, alert, NAD, calm, interactive, family at [facility].

## 2014-08-10 NOTE — ED Notes (Signed)
EMS reports patient was at [name] when she experienced a 30-40 second syncopal episode witnessed by family. EMS reports patient with a history of dementia, and patients is pleasantly confused at this time. Family should arrive to ED soon. EMS reports RBBB, CBG 140.

## 2014-08-10 NOTE — ED Notes (Signed)
Alert, NAD, calm, itneractive, resps e/u, speaking in clear complete sentences, VSS.  EDP at [facility]

## 2014-08-11 DIAGNOSIS — R55 Syncope and collapse: Secondary | ICD-10-CM | POA: Diagnosis not present

## 2014-08-11 MED ORDER — CEPHALEXIN 500 MG PO CAPS: 500.0000 mg | ORAL_CAPSULE | Freq: Two times a day (BID) | ORAL | Status: DC

## 2014-08-11 MED ORDER — CEPHALEXIN 250 MG PO CAPS
500.0000 mg | ORAL_CAPSULE | Freq: Once | ORAL | Status: AC
  Administered 2014-08-11: 500 mg via ORAL
  Filled 2014-08-11: qty 2

## 2014-08-11 NOTE — ED Notes (Signed)
I ambulated patient with her son, and I at side, patient ambulated with no assistance, stated she felt fine. Patient placed back in bed and continued monitoring.

## 2014-08-11 NOTE — ED Notes (Signed)
EDP at [facility] discussing results and plan with pt/family, VSS, pt alert, NAD, calm, interactive, resps e/u, speaking in clear complete sentences. Reported that pt ambulated with supervision.

## 2014-08-11 NOTE — Discharge Instructions (Signed)
Syncope °Syncope is a medical term for fainting or passing out. This means you lose consciousness and drop to the ground. People are generally unconscious for less than 5 minutes. You may have some muscle twitches for up to 15 seconds before waking up and returning to normal. Syncope occurs more often in older adults, but it can happen to anyone. While most causes of syncope are not dangerous, syncope can be a sign of a serious medical problem. It is important to seek medical care.  °CAUSES  °Syncope is caused by a sudden drop in blood flow to the brain. The specific cause is often not determined. Factors that can bring on syncope include: °· Taking medicines that lower blood pressure. °· Sudden changes in posture, such as standing up quickly. °· Taking more medicine than prescribed. °· Standing in one place for too long. °· Seizure disorders. °· Dehydration and excessive exposure to heat. °· Low blood sugar (hypoglycemia). °· Straining to have a bowel movement. °· Heart disease, irregular heartbeat, or other circulatory problems. °· Fear, emotional distress, seeing blood, or severe pain. °SYMPTOMS  °Right before fainting, you may: °· Feel dizzy or light-headed. °· Feel nauseous. °· See all white or all black in your field of vision. °· Have cold, clammy skin. °DIAGNOSIS  °Your health care provider will ask about your symptoms, perform a physical exam, and perform an electrocardiogram (ECG) to record the electrical activity of your heart. Your health care provider may also perform other heart or blood tests to determine the cause of your syncope which may include: °· Transthoracic echocardiogram (TTE). During echocardiography, sound waves are used to evaluate how blood flows through your heart. °· Transesophageal echocardiogram (TEE). °· Cardiac monitoring. This allows your health care provider to monitor your heart rate and rhythm in real time. °· Holter monitor. This is a portable device that records your  heartbeat and can help diagnose heart arrhythmias. It allows your health care provider to track your heart activity for several days, if needed. °· Stress tests by exercise or by giving medicine that makes the heart beat faster. °TREATMENT  °In most cases, no treatment is needed. Depending on the cause of your syncope, your health care provider may recommend changing or stopping some of your medicines. °HOME CARE INSTRUCTIONS °· Have someone stay with you until you feel stable. °· Do not drive, use machinery, or play sports until your health care provider says it is okay. °· Keep all follow-up appointments as directed by your health care provider. °· Lie down right away if you start feeling like you might faint. Breathe deeply and steadily. Wait until all the symptoms have passed. °· Drink enough fluids to keep your urine clear or pale yellow. °· If you are taking blood pressure or heart medicine, get up slowly and take several minutes to sit and then stand. This can reduce dizziness. °SEEK IMMEDIATE MEDICAL CARE IF:  °· You have a severe headache. °· You have unusual pain in the chest, abdomen, or back. °· You are bleeding from your mouth or rectum, or you have black or tarry stool. °· You have an irregular or very fast heartbeat. °· You have pain with breathing. °· You have repeated fainting or seizure-like jerking during an episode. °· You faint when sitting or lying down. °· You have confusion. °· You have trouble walking. °· You have severe weakness. °· You have vision problems. °If you fainted, call your local emergency services (911 in [place] Do not drive   yourself to the hospital.  °MAKE SURE YOU: °· Understand these instructions. °· Will watch your condition. °· Will get help right away if you are not doing well or get worse. °Document Released: 08/29/2005 Document Revised: 09/03/2013 Document Reviewed: 10/28/2011 °ExitCare® Patient Information ©2015 [employer]. This information is not intended to replace  advice given to you by your health care provider. Make sure you discuss any questions you have with your health care provider. ° °Urinary Tract Infection °Urinary tract infections (UTIs) can develop anywhere along your urinary tract. Your urinary tract is your body's drainage system for removing wastes and extra water. Your urinary tract includes two kidneys, two ureters, a bladder, and a urethra. Your kidneys are a pair of bean-shaped organs. Each kidney is about the size of your fist. They are located below your ribs, one on each side of your spine. °CAUSES °Infections are caused by microbes, which are microscopic organisms, including fungi, viruses, and bacteria. These organisms are so small that they can only be seen through a microscope. Bacteria are the microbes that most commonly cause UTIs. °SYMPTOMS  °Symptoms of UTIs may vary by age and gender of the patient and by the location of the infection. Symptoms in young women typically include a frequent and intense urge to urinate and a painful, burning feeling in the bladder or urethra during urination. Older women and men are more likely to be tired, shaky, and weak and have muscle aches and abdominal pain. A fever may mean the infection is in your kidneys. Other symptoms of a kidney infection include pain in your back or sides below the ribs, nausea, and vomiting. °DIAGNOSIS °To diagnose a UTI, your caregiver will ask you about your symptoms. Your caregiver also will ask to provide a urine sample. The urine sample will be tested for bacteria and white blood cells. White blood cells are made by your body to help fight infection. °TREATMENT  °Typically, UTIs can be treated with medication. Because most UTIs are caused by a bacterial infection, they usually can be treated with the use of antibiotics. The choice of antibiotic and length of treatment depend on your symptoms and the type of bacteria causing your infection. °HOME CARE INSTRUCTIONS °· If you were  prescribed antibiotics, take them exactly as your caregiver instructs you. Finish the medication even if you feel better after you have only taken some of the medication. °· Drink enough water and fluids to keep your urine clear or pale yellow. °· Avoid caffeine, tea, and carbonated beverages. They tend to irritate your bladder. °· Empty your bladder often. Avoid holding urine for long periods of time. °· Empty your bladder before and after sexual intercourse. °· After a bowel movement, women should cleanse from front to back. Use each tissue only once. °SEEK MEDICAL CARE IF:  °· You have back pain. °· You develop a fever. °· Your symptoms do not begin to resolve within 3 days. °SEEK IMMEDIATE MEDICAL CARE IF:  °· You have severe back pain or lower abdominal pain. °· You develop chills. °· You have nausea or vomiting. °· You have continued burning or discomfort with urination. °MAKE SURE YOU:  °· Understand these instructions. °· Will watch your condition. °· Will get help right away if you are not doing well or get worse. °Document Released: 06/08/2005 Document Revised: 02/28/2012 Document Reviewed: 10/07/2011 °ExitCare® Patient Information ©2015 [employer]. This information is not intended to replace advice given to you by your health care provider. Make sure   you discuss any questions you have with your health care provider.

## 2014-08-13 LAB — URINE CULTURE: Colony Count: >=100000

## 2014-08-14 ENCOUNTER — Telehealth (HOSPITAL_BASED_OUTPATIENT_CLINIC_OR_DEPARTMENT_OTHER): Payer: Self-pay | Admitting: Emergency Medicine

## 2014-08-14 NOTE — Telephone Encounter (Signed)
Post ED Visit - Positive Culture Follow-up  Culture report reviewed by antimicrobial stewardship pharmacist: []  Wes Dulaney, Pharm.D., BCPS []  Heide Guile, Pharm.D., BCPS [x]  Alycia Rossetti, Pharm.D., BCPS []  [place], [place].D., BCPS, AAHIVP []  Legrand Como, Pharm.D., BCPS, AAHIVP []  Elicia Lamp, Pharm.D.   Positive urine culture Klebsiella Treated with cephalexin, organism sensitive to the same and no further patient follow-up is required at this time.  Hazle Nordmann 08/14/2014, 5:38 PM

## 2014-08-21 ENCOUNTER — Encounter (HOSPITAL_COMMUNITY): Payer: Self-pay | Admitting: Internal Medicine

## 2014-09-06 ENCOUNTER — Encounter (HOSPITAL_BASED_OUTPATIENT_CLINIC_OR_DEPARTMENT_OTHER): Payer: Self-pay

## 2014-09-06 ENCOUNTER — Emergency Department (HOSPITAL_BASED_OUTPATIENT_CLINIC_OR_DEPARTMENT_OTHER)
Admission: EM | Admit: 2014-09-06 | Discharge: 2014-09-07 | Disposition: A | Discharge status: Home / Self Care | Payer: Medicare Other | Attending: Emergency Medicine | Admitting: Emergency Medicine

## 2014-09-06 ENCOUNTER — Emergency Department (HOSPITAL_BASED_OUTPATIENT_CLINIC_OR_DEPARTMENT_OTHER): Payer: Medicare Other

## 2014-09-06 DIAGNOSIS — Z79899 Other long term (current) drug therapy: Secondary | ICD-10-CM | POA: Insufficient documentation

## 2014-09-06 DIAGNOSIS — R05 Cough: Secondary | ICD-10-CM

## 2014-09-06 DIAGNOSIS — E119 Type 2 diabetes mellitus without complications: Secondary | ICD-10-CM | POA: Insufficient documentation

## 2014-09-06 DIAGNOSIS — E785 Hyperlipidemia, unspecified: Secondary | ICD-10-CM | POA: Diagnosis not present

## 2014-09-06 DIAGNOSIS — F0390 Unspecified dementia without behavioral disturbance: Secondary | ICD-10-CM | POA: Diagnosis not present

## 2014-09-06 DIAGNOSIS — Z7982 Long term (current) use of aspirin: Secondary | ICD-10-CM | POA: Insufficient documentation

## 2014-09-06 DIAGNOSIS — Z87448 Personal history of other diseases of urinary system: Secondary | ICD-10-CM | POA: Diagnosis not present

## 2014-09-06 DIAGNOSIS — F411 Generalized anxiety disorder: Secondary | ICD-10-CM | POA: Diagnosis not present

## 2014-09-06 DIAGNOSIS — I10 Essential (primary) hypertension: Secondary | ICD-10-CM | POA: Diagnosis not present

## 2014-09-06 DIAGNOSIS — Z8781 Personal history of (healed) traumatic fracture: Secondary | ICD-10-CM | POA: Diagnosis not present

## 2014-09-06 DIAGNOSIS — J189 Pneumonia, unspecified organism: Secondary | ICD-10-CM

## 2014-09-06 DIAGNOSIS — Z792 Long term (current) use of antibiotics: Secondary | ICD-10-CM | POA: Diagnosis not present

## 2014-09-06 DIAGNOSIS — G20 Parkinson's disease: Secondary | ICD-10-CM | POA: Insufficient documentation

## 2014-09-06 DIAGNOSIS — Z8543 Personal history of malignant neoplasm of ovary: Secondary | ICD-10-CM | POA: Diagnosis not present

## 2014-09-06 DIAGNOSIS — F329 Major depressive disorder, single episode, unspecified: Secondary | ICD-10-CM | POA: Insufficient documentation

## 2014-09-06 DIAGNOSIS — R4182 Altered mental status, unspecified: Secondary | ICD-10-CM | POA: Diagnosis present

## 2014-09-06 DIAGNOSIS — R059 Cough, unspecified: Secondary | ICD-10-CM

## 2014-09-06 DIAGNOSIS — J159 Unspecified bacterial pneumonia: Secondary | ICD-10-CM | POA: Diagnosis not present

## 2014-09-06 MED ORDER — SODIUM CHLORIDE 0.9 % IV SOLN
INTRAVENOUS | Status: DC
  Administered 2014-09-07: 01:00:00 via INTRAVENOUS

## 2014-09-06 NOTE — ED Notes (Signed)
Daughter reports patient has been confused, lethargic today - reports patient does have Dementia/Alzheimer's but states today she was more confused, daughter reports fever, daughter suspicious of of sepsis/UTI/PNA.

## 2014-09-06 NOTE — ED Provider Notes (Addendum)
CSN: [id number]     Arrival date & time 09/06/14  2303 History  This chart was scribed for Wynetta Fines, MD by Jeanell Sparrow, ED Scribe. This patient was seen in room MH02/MH02 and the patient's care was started at 12:06 AM.   Chief Complaint  Patient presents with  . Altered Mental Status   The history is provided by a relative. No language interpreter was used.   HPI  Level 5 Caveat: dementia. Alicia [name] is a 80 y.o. female with a hx of frequent UTI who presents to the Emergency Department with weakness and confusion worse than her baseline. Daughter reports a low grade fever and chills at home. She denies any cough, nausea, emesis, or diarrhea in pt. patient is usually nonverbal.  Past Medical History  Diagnosis Date  . Dementia     significant  . Renal disease   . Memory loss 12/10/2012  . Depression 12/10/2012  . Anxiety state, unspecified 12/10/2012  . Spells 12/10/2012  . Essential hypertension, benign 12/10/2012  . Other and unspecified hyperlipidemia 12/10/2012  . Loss of weight 12/10/2012  . Carotid artery stenosis   . Ovarian cancer     treated with surgery & chemo  . Ankle fracture, right   . Unstable gait     mild  . Syncope 12/10/2012    Recurrent, sporadic episodes  . Carotid artery disease   . Parkinson disease   . Diabetes mellitus without complication    Past Surgical History  Procedure Laterality Date  . Vesicovaginal fistula closure w/ tah  2005  . Oophorectomy  2007    for ovarian cancer  . Prolapsed uterine fibroid ligation    . Appendectomy    . Cataract extraction Bilateral   . Implantable loop recorder  11/14    MDT LINQ implanted for recurrent unexplained syncope by Dr Rayann Heman  . Abdominal hysterectomy    . Ankle surgery Left   . Loop recorder implant N/A 07/17/2013    Procedure: LOOP RECORDER IMPLANT;  Surgeon: Coralyn Mark, MD;  Location: [facility];  Service: Cardiovascular;  Laterality: N/A;   Family History  Problem Relation Age  of Onset  . Cancer Mother     breast  . Stroke Father    History  Substance Use Topics  . Smoking status: Never Smoker   . Smokeless tobacco: Never Used  . Alcohol Use: No   OB History    No data available     Review of Systems  Unable to perform ROS   Allergies  Iohexol; Ativan; Ciprofloxacin; Nsaids; Sulfa antibiotics; and Ivp dye  Home Medications   Prior to Admission medications   Medication Sig Start Date End Date Taking? Authorizing Provider  Albuterol Sulfate (PROAIR RESPICLICK) 163 (90 BASE) MCG/ACT AEPB Inhale 2 puffs into the lungs every 4 (four) hours as needed (for shortness of breath).  05/08/14  Yes Historical Provider, MD  amLODipine (NORVASC) 2.5 MG tablet Take 2.5 mg by mouth daily.   Yes Historical Provider, MD  aspirin EC 81 MG tablet Take 81 mg by mouth daily.   Yes Historical Provider, MD  atorvastatin (LIPITOR) 20 MG tablet Take 20 mg by mouth at bedtime.    Yes Historical Provider, MD  calcium carbonate (OS-CAL) 600 MG TABS tablet Take 600 mg by mouth at bedtime.   Yes Historical Provider, MD  loratadine (CLARITIN) 10 MG tablet Take 10 mg by mouth as needed for allergies.  11/30/13  Yes Historical Provider, MD  Memantine HCl ER (NAMENDA XR) 28 MG CP24 Take 28 mg by mouth daily. 04/02/14  Yes Star Age, MD  Multiple Vitamin (MULTIVITAMIN WITH MINERALS) TABS Take 1 tablet by mouth daily.   Yes Historical Provider, MD  oxybutynin (DITROPAN-XL) 10 MG 24 hr tablet Take 10 mg by mouth daily.   Yes Historical Provider, MD  polyethylene glycol (MIRALAX / GLYCOLAX) packet Take 17 g by mouth daily as needed for mild constipation. With 8 oz of liquid daily. 12/24/13  Yes Eugenie Filler, MD  promethazine (PHENERGAN) 12.5 MG tablet Take 1 tablet (12.5 mg total) by mouth every 8 (eight) hours as needed for nausea or vomiting. 06/12/14  Yes Alexa Marvel Plan, MD  sertraline (ZOLOFT) 50 MG tablet Take 50 mg by mouth daily.   Yes Historical Provider, MD  sodium phosphate  (FLEET) 7-19 GM/118ML ENEM Place 133 mLs (1 enema total) rectally daily as needed for severe constipation. 06/20/14  Yes Renne Musca, MD  cephALEXin (KEFLEX) 500 MG capsule Take 1 capsule (500 mg total) by mouth 2 (two) times daily. 08/11/14   Quintella Reichert, MD  clarithromycin (BIAXIN) 500 MG tablet Take 1 tablet (500 mg total) by mouth 2 (two) times daily. 09/07/14   Karen Chafe Taliesin Hartlage, MD  famotidine (PEPCID) 20 MG tablet Take 1 tablet (20 mg total) by mouth 2 (two) times daily. 05/29/14   Charlesetta Shanks, MD   BP 133/66 mmHg  Pulse 87  Temp(Src) 98 F (36.7 C) (Oral)  Resp 18  Ht 5\' 3"  (1.6 m)  Wt 137 lb (62.143 kg)  BMI 24.27 kg/m2  SpO2 91% Physical Exam  Nursing note and vitals reviewed. General: Well-developed, well-nourished female in no acute distress; appearance consistent with age of record HENT: normocephalic; atraumatic Eyes: pupils equal, round and reactive to light; extraocular muscles grossly intact, but exam is limited; lens implants Neck: supple Heart: regular rate and rhythm Lungs: clear to auscultation bilaterally Abdomen: soft; nondistended; nontender; no masses or hepatosplenomegaly; normal bowel sounds present Extremities: Arthritic changes with ulnar deviation of digits; pulses normal Neurologic: Awake, alert, non-verbal; motor function intact in all extremities and symmetric; no facial droop Skin: Warm and dry Psychiatric: Normal mood and affect   ED Course  Procedures (including critical care time) DIAGNOSTIC STUDIES: Oxygen Saturation is 91% on RA, normal by my interpretation.    COORDINATION OF CARE: 12:10 AM- Pt advised of plan for treatment which includes medication, radiology, and labs and pt agrees.   MDM   EKG Interpretation:  Date & Time: 09/07/2014 3:47AM  Rate: 82  Rhythm: normal sinus rhythm  QRS Axis: normal  Intervals: QT prolonged  ST/T Wave abnormalities: normal  Conduction Disutrbances:right bundle branch block  Narrative  Interpretation:   Old EKG Reviewed: unchanged   Nursing notes and vitals signs, including pulse oximetry, reviewed.  Summary of this visit's results, reviewed by myself:  Labs:  Results for orders placed or performed during the hospital encounter of 09/06/14 (from the past 24 hour(s))  Urinalysis, Routine w reflex microscopic     Status: None   Collection Time: 09/07/14 12:40 AM  Result Value Ref Range   Color, Urine YELLOW YELLOW   APPearance CLEAR CLEAR   Specific Gravity, Urine 1.010 1.005 - 1.030   pH 5.5 5.0 - 8.0   Glucose, UA NEGATIVE NEGATIVE mg/dL   Hgb urine dipstick NEGATIVE NEGATIVE   Bilirubin Urine NEGATIVE NEGATIVE   Ketones, ur NEGATIVE NEGATIVE mg/dL   Protein, ur NEGATIVE NEGATIVE mg/dL   Urobilinogen, UA  0.2 0.0 - 1.0 mg/dL   Nitrite NEGATIVE NEGATIVE   Leukocytes, UA NEGATIVE NEGATIVE  CBC with Differential     Status: None   Collection Time: 09/07/14  1:00 AM  Result Value Ref Range   WBC 7.4 4.0 - 10.5 K/uL   RBC 4.21 3.87 - 5.11 MIL/uL   Hemoglobin 12.6 12.0 - 15.0 g/dL   HCT 39.1 36.0 - 46.0 %   MCV 92.9 78.0 - 100.0 fL   MCH 29.9 26.0 - 34.0 pg   MCHC 32.2 30.0 - 36.0 g/dL   RDW 14.7 11.5 - 15.5 %   Platelets 203 150 - 400 K/uL   Neutrophils Relative % 66 43 - 77 %   Neutro Abs 4.9 1.7 - 7.7 K/uL   Lymphocytes Relative 22 12 - 46 %   Lymphs Abs 1.6 0.7 - 4.0 K/uL   Monocytes Relative 9 3 - 12 %   Monocytes Absolute 0.7 0.1 - 1.0 K/uL   Eosinophils Relative 3 0 - 5 %   Eosinophils Absolute 0.2 0.0 - 0.7 K/uL   Basophils Relative 0 0 - 1 %   Basophils Absolute 0.0 0.0 - 0.1 K/uL  Basic metabolic panel     Status: Abnormal   Collection Time: 09/07/14  1:00 AM  Result Value Ref Range   Sodium 137 135 - 145 mmol/L   Potassium 5.2 (H) 3.5 - 5.1 mmol/L   Chloride 103 96 - 112 mEq/L   CO2 27 19 - 32 mmol/L   Glucose, Bld 154 (H) 70 - 99 mg/dL   BUN 32 (H) 6 - 23 mg/dL   Creatinine, Ser 1.29 (H) 0.50 - 1.10 mg/dL   Calcium 8.8 8.4 - 10.5 mg/dL    GFR calc non Af Amer 36 (L) >90 mL/min   GFR calc Af Amer 41 (L) >90 mL/min   Anion gap 7 5 - 15  Troponin I     Status: None   Collection Time: 09/07/14  1:00 AM  Result Value Ref Range   Troponin I <0.03 <0.031 ng/mL    Imaging Studies: Dg Chest 2 View  09/07/2014   CLINICAL DATA:  Acute onset of confusion and lethargy. Fever. Cough. Initial encounter.  EXAM: CHEST  2 VIEW  COMPARISON:  Chest radiograph performed 08/10/2014  FINDINGS: The lungs are hypoexpanded. Left basilar airspace opacity raises concern for mild pneumonia. There is no evidence of pleural effusion or pneumothorax.  The heart is normal in size; the mediastinal contour is within normal limits. No acute osseous abnormalities are seen. A metallic device is noted overlying the lower left hemithorax.  IMPRESSION: Left basilar airspace opacity raises concern for mild pneumonia. Lungs mildly hypoexpanded.   Electronically Signed   By: Garald Balding M.D.   On: 09/07/2014 00:18   3:50 AM Patient stable, awake and alert. We will start Rocephin in the ED and discharge home on antibiotic.   4:16 AM Pulse oximeter sensor changed; waveform improved with saturation 97-98% on room air.  I personally performed the services described in this documentation, which was scribed in my presence. The recorded information has been reviewed and is accurate.   Wynetta Fines, MD 09/07/14 [id number]  Wynetta Fines, MD 09/07/14 [id number]

## 2014-09-07 DIAGNOSIS — J159 Unspecified bacterial pneumonia: Secondary | ICD-10-CM | POA: Diagnosis not present

## 2014-09-07 LAB — URINALYSIS, ROUTINE W REFLEX MICROSCOPIC
BILIRUBIN URINE: NEGATIVE
Glucose, UA: NEGATIVE mg/dL
Hgb urine dipstick: NEGATIVE
Ketones, ur: NEGATIVE mg/dL
Leukocytes, UA: NEGATIVE
Nitrite: NEGATIVE
Protein, ur: NEGATIVE mg/dL
Specific Gravity, Urine: 1.01 (ref 1.005–1.030)
UROBILINOGEN UA: 0.2 mg/dL (ref 0.0–1.0)
pH: 5.5 (ref 5.0–8.0)

## 2014-09-07 LAB — BASIC METABOLIC PANEL
Anion gap: 7 (ref 5–15)
BUN: 32 mg/dL — ABNORMAL HIGH (ref 6–23)
CO2: 27 mmol/L (ref 19–32)
Calcium: 8.8 mg/dL (ref 8.4–10.5)
Chloride: 103 mEq/L (ref 96–112)
Creatinine, Ser: 1.29 mg/dL — ABNORMAL HIGH (ref 0.50–1.10)
GFR, EST AFRICAN AMERICAN: 41 mL/min — AB (ref >90)
GFR, EST NON AFRICAN AMERICAN: 36 mL/min — AB (ref >90)
Glucose, Bld: 154 mg/dL — ABNORMAL HIGH (ref 70–99)
Potassium: 5.2 mmol/L — ABNORMAL HIGH (ref 3.5–5.1)
Sodium: 137 mmol/L (ref 135–145)

## 2014-09-07 LAB — CBC WITH DIFFERENTIAL/PLATELET
BASOS PCT: 0 % (ref 0–1)
Basophils Absolute: 0 10*3/uL (ref 0.0–0.1)
EOS ABS: 0.2 10*3/uL (ref 0.0–0.7)
EOS PCT: 3 % (ref 0–5)
HCT: 39.1 % (ref 36.0–46.0)
HEMOGLOBIN: 12.6 g/dL (ref 12.0–15.0)
Lymphocytes Relative: 22 % (ref 12–46)
Lymphs Abs: 1.6 10*3/uL (ref 0.7–4.0)
MCH: 29.9 pg (ref 26.0–34.0)
MCHC: 32.2 g/dL (ref 30.0–36.0)
MCV: 92.9 fL (ref 78.0–100.0)
MONOS PCT: 9 % (ref 3–12)
Monocytes Absolute: 0.7 10*3/uL (ref 0.1–1.0)
Neutro Abs: 4.9 10*3/uL (ref 1.7–7.7)
Neutrophils Relative %: 66 % (ref 43–77)
Platelets: 203 10*3/uL (ref 150–400)
RBC: 4.21 MIL/uL (ref 3.87–5.11)
RDW: 14.7 % (ref 11.5–15.5)
WBC: 7.4 10*3/uL (ref 4.0–10.5)

## 2014-09-07 LAB — TROPONIN I: Troponin I: <0.03 ng/mL (ref <0.031)

## 2014-09-07 MED ORDER — CLARITHROMYCIN 500 MG PO TABS: 500.0000 mg | ORAL_TABLET | Freq: Two times a day (BID) | ORAL | Status: DC

## 2014-09-07 MED ORDER — DOXYCYCLINE HYCLATE 100 MG PO TABS
100.0000 mg | ORAL_TABLET | Freq: Once | ORAL | Status: AC
  Administered 2014-09-07: 100 mg via ORAL
  Filled 2014-09-07: qty 1

## 2014-09-07 MED ORDER — CEFTRIAXONE SODIUM 1 G IJ SOLR
INTRAMUSCULAR | Status: AC
Start: 2014-09-07 — End: 2014-09-07
  Filled 2014-09-07: qty 10

## 2014-09-07 MED ORDER — DEXTROSE 5 % IV SOLN
1.0000 g | Freq: Once | INTRAVENOUS | Status: AC
  Administered 2014-09-07: 1 g via INTRAVENOUS

## 2014-09-07 MED ORDER — DOXYCYCLINE HYCLATE 100 MG PO CAPS: 100.0000 mg | ORAL_CAPSULE | Freq: Two times a day (BID) | ORAL | Status: DC

## 2014-09-07 NOTE — ED Notes (Signed)
MD at bedside. 

## 2014-09-07 NOTE — ED Notes (Signed)
IN TO DISCHARGE PATIENT - PT C/O FEELING SHORT OF BREATH, CHEEKS REDDENED, PT HAD JUST AMBULATED TO BATHROOM - O2 SAT DOWN TO 89% ON ROOM AIR - DAUGHTER IN LAW REPORTS PATIENT DOES NOT USE HOME O2 AND PATIENT NORMALLY AMBULATES INDEPENDENTLY AT HOME TO PERFORM ADL'S - DR MOLPUS CALLED TO BEDSIDE AND ORDERS OBTAINED. - DISCHARGE ON HOLD AT THIS TIME PENDING NEW ORDER RESULTS.

## 2014-09-09 ENCOUNTER — Encounter (HOSPITAL_COMMUNITY): Payer: Self-pay | Admitting: Emergency Medicine

## 2014-09-09 DIAGNOSIS — Z8543 Personal history of malignant neoplasm of ovary: Secondary | ICD-10-CM | POA: Insufficient documentation

## 2014-09-09 DIAGNOSIS — I10 Essential (primary) hypertension: Secondary | ICD-10-CM | POA: Diagnosis not present

## 2014-09-09 DIAGNOSIS — Z8701 Personal history of pneumonia (recurrent): Secondary | ICD-10-CM | POA: Diagnosis not present

## 2014-09-09 DIAGNOSIS — Z7982 Long term (current) use of aspirin: Secondary | ICD-10-CM | POA: Diagnosis not present

## 2014-09-09 DIAGNOSIS — R05 Cough: Secondary | ICD-10-CM | POA: Insufficient documentation

## 2014-09-09 DIAGNOSIS — Z79899 Other long term (current) drug therapy: Secondary | ICD-10-CM | POA: Insufficient documentation

## 2014-09-09 DIAGNOSIS — E119 Type 2 diabetes mellitus without complications: Secondary | ICD-10-CM | POA: Diagnosis not present

## 2014-09-09 DIAGNOSIS — R4182 Altered mental status, unspecified: Secondary | ICD-10-CM | POA: Diagnosis present

## 2014-09-09 DIAGNOSIS — Z8781 Personal history of (healed) traumatic fracture: Secondary | ICD-10-CM | POA: Insufficient documentation

## 2014-09-09 DIAGNOSIS — Z87448 Personal history of other diseases of urinary system: Secondary | ICD-10-CM | POA: Insufficient documentation

## 2014-09-09 DIAGNOSIS — Z792 Long term (current) use of antibiotics: Secondary | ICD-10-CM | POA: Diagnosis not present

## 2014-09-09 DIAGNOSIS — E785 Hyperlipidemia, unspecified: Secondary | ICD-10-CM | POA: Insufficient documentation

## 2014-09-09 DIAGNOSIS — G20 Parkinson's disease: Secondary | ICD-10-CM | POA: Insufficient documentation

## 2014-09-09 NOTE — ED Notes (Signed)
Pt.'s family reported pt. has been confused and disoriented this afternoon , recently diagnosed with pneumonia currently taking oral antibiotic . Pt. disoriented to time /place at triage , respirations unlabored / denies pain .

## 2014-09-10 ENCOUNTER — Emergency Department (HOSPITAL_COMMUNITY): Payer: Medicare Other

## 2014-09-10 ENCOUNTER — Emergency Department (HOSPITAL_COMMUNITY)
Admission: EM | Admit: 2014-09-10 | Discharge: 2014-09-10 | Disposition: A | Discharge status: Home / Self Care | Payer: Medicare Other | Attending: Emergency Medicine | Admitting: Emergency Medicine

## 2014-09-10 ENCOUNTER — Encounter (HOSPITAL_COMMUNITY): Payer: Self-pay

## 2014-09-10 DIAGNOSIS — R4182 Altered mental status, unspecified: Secondary | ICD-10-CM | POA: Diagnosis not present

## 2014-09-10 LAB — CBC WITH DIFFERENTIAL/PLATELET
Basophils Absolute: 0 10*3/uL (ref 0.0–0.1)
Basophils Relative: 0 % (ref 0–1)
Eosinophils Absolute: 0.2 10*3/uL (ref 0.0–0.7)
Eosinophils Relative: 3 % (ref 0–5)
HEMATOCRIT: 37.5 % (ref 36.0–46.0)
HEMOGLOBIN: 12.2 g/dL (ref 12.0–15.0)
LYMPHS PCT: 24 % (ref 12–46)
Lymphs Abs: 1.4 10*3/uL (ref 0.7–4.0)
MCH: 29.8 pg (ref 26.0–34.0)
MCHC: 32.5 g/dL (ref 30.0–36.0)
MCV: 91.5 fL (ref 78.0–100.0)
MONO ABS: 0.5 10*3/uL (ref 0.1–1.0)
MONOS PCT: 8 % (ref 3–12)
Neutro Abs: 3.6 10*3/uL (ref 1.7–7.7)
Neutrophils Relative %: 65 % (ref 43–77)
Platelets: 194 10*3/uL (ref 150–400)
RBC: 4.1 MIL/uL (ref 3.87–5.11)
RDW: 14.4 % (ref 11.5–15.5)
WBC: 5.7 10*3/uL (ref 4.0–10.5)

## 2014-09-10 LAB — COMPREHENSIVE METABOLIC PANEL
ALT: 17 U/L (ref 0–35)
ANION GAP: 9 (ref 5–15)
AST: 24 U/L (ref 0–37)
Albumin: 3.4 g/dL — ABNORMAL LOW (ref 3.5–5.2)
Alkaline Phosphatase: 82 U/L (ref 39–117)
BILIRUBIN TOTAL: 0.4 mg/dL (ref 0.3–1.2)
BUN: 28 mg/dL — AB (ref 6–23)
CO2: 23 mmol/L (ref 19–32)
CREATININE: 1.37 mg/dL — AB (ref 0.50–1.10)
Calcium: 9 mg/dL (ref 8.4–10.5)
Chloride: 108 mEq/L (ref 96–112)
GFR calc Af Amer: 38 mL/min — ABNORMAL LOW (ref >90)
GFR calc non Af Amer: 33 mL/min — ABNORMAL LOW (ref >90)
Glucose, Bld: 149 mg/dL — ABNORMAL HIGH (ref 70–99)
Potassium: 4.1 mmol/L (ref 3.5–5.1)
Sodium: 140 mmol/L (ref 135–145)
Total Protein: 6.4 g/dL (ref 6.0–8.3)

## 2014-09-10 LAB — URINALYSIS, ROUTINE W REFLEX MICROSCOPIC
Bilirubin Urine: NEGATIVE
Glucose, UA: NEGATIVE mg/dL
Hgb urine dipstick: NEGATIVE
KETONES UR: NEGATIVE mg/dL
NITRITE: NEGATIVE
PROTEIN: NEGATIVE mg/dL
Specific Gravity, Urine: 1.023 (ref 1.005–1.030)
UROBILINOGEN UA: 0.2 mg/dL (ref 0.0–1.0)
pH: 5 (ref 5.0–8.0)

## 2014-09-10 LAB — URINE MICROSCOPIC-ADD ON

## 2014-09-10 NOTE — ED Notes (Signed)
Pt escorted by family in wheelchair.

## 2014-09-10 NOTE — Discharge Instructions (Signed)
Follow-up with your primary Dr. if not improving in the next several days, and return to the ER if you develop any new and concerning symptoms.   Altered Mental Status Altered mental status most often refers to an abnormal change in your responsiveness and awareness. It can affect your speech, thought, mobility, memory, attention span, or alertness. It can range from slight confusion to complete unresponsiveness (coma). Altered mental status can be a sign of a serious underlying medical condition. Rapid evaluation and medical treatment is necessary for patients having an altered mental status. CAUSES   Low blood sugar (hypoglycemia) or diabetes.  Severe loss of body fluids (dehydration) or a body salt (electrolyte) imbalance.  A stroke or other neurologic problem, such as dementia or delirium.  A head injury or tumor.  A drug or alcohol overdose.  Exposure to toxins or poisons.  Depression, anxiety, and stress.  A low oxygen level (hypoxia).  An infection.  Blood loss.  Twitching or shaking (seizure).  Heart problems, such as heart attack or heart rhythm problems (arrhythmias).  A body temperature that is too low or too high (hypothermia or hyperthermia). DIAGNOSIS  A diagnosis is based on your history, symptoms, physical and neurologic examinations, and diagnostic tests. Diagnostic tests may include:  Measurement of your blood pressure, pulse, breathing, and oxygen levels (vital signs).  Blood tests.  Urine tests.  X-ray exams.  A computerized magnetic scan (magnetic resonance imaging, MRI).  A computerized X-ray scan (computed tomography, CT scan). TREATMENT  Treatment will depend on the cause. Treatment may include:  Management of an underlying medical or mental health condition.  Critical care or support in the hospital. [place]   Only take over-the-counter or prescription medicines for pain, discomfort, or fever as directed by your  caregiver.  Manage underlying conditions as directed by your caregiver.  Eat a healthy, well-balanced diet to maintain strength.  Join a support group or prevention program to cope with the condition or trauma that caused the altered mental status. Ask your caregiver to help choose a program that works for you.  Follow up with your caregiver for further examination, therapy, or testing as directed. SEEK MEDICAL CARE IF:   You feel unwell or have chills.  You or your family notice a change in your behavior or your alertness.  You have trouble following your caregiver's treatment plan.  You have questions or concerns. SEEK IMMEDIATE MEDICAL CARE IF:   You have a rapid heartbeat or have chest pain.  You have difficulty breathing.  You have a fever.  You have a headache with a stiff neck.  You cough up blood.  You have blood in your urine or stool.  You have severe agitation or confusion. MAKE SURE YOU:   Understand these instructions.  Will watch your condition.  Will get help right away if you are not doing well or get worse. Document Released: 02/16/2010 Document Revised: 11/21/2011 Document Reviewed: 02/16/2010 [facility] Patient Information 2015 [place], [place]. This information is not intended to replace advice given to you by your health care provider. Make sure you discuss any questions you have with your health care provider.

## 2014-09-10 NOTE — ED Provider Notes (Signed)
CSN: [id number]     Arrival date & time 09/09/14  2328 History  This chart was scribe for Alicia Speak, MD by Judithann Sauger, ED Scribe. The patient was seen in room B18C/B18C and the patient's care was started at 1:35 AM.    Chief Complaint  Patient presents with  . Altered Mental Status   The history is provided by the patient. No language interpreter was used.   HPI Comments:  Alicia [name] is a 80 y.o. female brought in by her daughter to the Emergency Department complaining of AMS onset yesterday evening. Her daughter reports that she was diagnosed with pneumonia on 09/06/14 but started her antibiotics today. She states that as the night progressed, the pt started "talking out of her head", confused, and disoriented. She reports that pt has had associated frequent urination and a cough. Pt denies any pain, SOB, fever, N/V/D.   Past Medical History  Diagnosis Date  . Dementia     significant  . Renal disease   . Memory loss 12/10/2012  . Depression 12/10/2012  . Anxiety state, unspecified 12/10/2012  . Spells 12/10/2012  . Essential hypertension, benign 12/10/2012  . Other and unspecified hyperlipidemia 12/10/2012  . Loss of weight 12/10/2012  . Carotid artery stenosis   . Ovarian cancer     treated with surgery & chemo  . Ankle fracture, right   . Unstable gait     mild  . Syncope 12/10/2012    Recurrent, sporadic episodes  . Carotid artery disease   . Parkinson disease   . Diabetes mellitus without complication    Past Surgical History  Procedure Laterality Date  . Vesicovaginal fistula closure w/ tah  2005  . Oophorectomy  2007    for ovarian cancer  . Prolapsed uterine fibroid ligation    . Appendectomy    . Cataract extraction Bilateral   . Implantable loop recorder  11/14    MDT LINQ implanted for recurrent unexplained syncope by Dr Rayann Heman  . Abdominal hysterectomy    . Ankle surgery Left   . Loop recorder implant N/A 07/17/2013    Procedure: LOOP RECORDER  IMPLANT;  Surgeon: Coralyn Mark, MD;  Location: [facility];  Service: Cardiovascular;  Laterality: N/A;   Family History  Problem Relation Age of Onset  . Cancer Mother     breast  . Stroke Father    History  Substance Use Topics  . Smoking status: Never Smoker   . Smokeless tobacco: Never Used  . Alcohol Use: No   OB History    No data available     Review of Systems  Constitutional: Negative for fever.  Respiratory: Positive for cough. Negative for shortness of breath.   Gastrointestinal: Negative for nausea, vomiting and diarrhea.  Neurological: Positive for speech difficulty.  All other systems reviewed and are negative.   A complete 10 system review of systems was obtained and all systems are negative except as noted in the HPI and PMH.    Allergies  Iohexol; Ativan; Ciprofloxacin; Nsaids; Sulfa antibiotics; and Ivp dye  Home Medications   Prior to Admission medications   Medication Sig Start Date End Date Taking? Authorizing Provider  Albuterol Sulfate (PROAIR RESPICLICK) 361 (90 BASE) MCG/ACT AEPB Inhale 2 puffs into the lungs every 4 (four) hours as needed (for shortness of breath).  05/08/14   Historical Provider, MD  amLODipine (NORVASC) 2.5 MG tablet Take 2.5 mg by mouth daily.    Historical Provider, MD  aspirin EC 81 MG tablet Take 81 mg by mouth daily.    Historical Provider, MD  atorvastatin (LIPITOR) 20 MG tablet Take 20 mg by mouth at bedtime.     Historical Provider, MD  calcium carbonate (OS-CAL) 600 MG TABS tablet Take 600 mg by mouth at bedtime.    Historical Provider, MD  doxycycline (VIBRAMYCIN) 100 MG capsule Take 1 capsule (100 mg total) by mouth 2 (two) times daily. 09/07/14   John L Molpus, MD  loratadine (CLARITIN) 10 MG tablet Take 10 mg by mouth as needed for allergies.  11/30/13   Historical Provider, MD  Memantine HCl ER (NAMENDA XR) 28 MG CP24 Take 28 mg by mouth daily. 04/02/14   Star Age, MD  Multiple Vitamin (MULTIVITAMIN WITH  MINERALS) TABS Take 1 tablet by mouth daily.    Historical Provider, MD  polyethylene glycol (MIRALAX / GLYCOLAX) packet Take 17 g by mouth daily as needed for mild constipation. With 8 oz of liquid daily. 12/24/13   Eugenie Filler, MD  promethazine (PHENERGAN) 12.5 MG tablet Take 1 tablet (12.5 mg total) by mouth every 8 (eight) hours as needed for nausea or vomiting. 06/12/14   Osa Craver, MD  sertraline (ZOLOFT) 50 MG tablet Take 50 mg by mouth daily.    Historical Provider, MD  sodium phosphate (FLEET) 7-19 GM/118ML ENEM Place 133 mLs (1 enema total) rectally daily as needed for severe constipation. 06/20/14   Renne Musca, MD   BP 200/84 mmHg  Pulse 99  Temp(Src) 98.1 F (36.7 C) (Oral)  Resp 20  Ht 5\' 3"  (1.6 m)  Wt 137 lb (62.143 kg)  BMI 24.27 kg/m2  SpO2 97% Physical Exam  Constitutional: She is oriented to person, place, and time. She appears well-developed and well-nourished. No distress.  Pt is an elderly female in NAD. She is intermittently confused but attempts to answer questions appropriately.   HENT:  Head: Normocephalic and atraumatic.  Eyes: Conjunctivae and EOM are normal.  Neck: Neck supple. No tracheal deviation present.  Cardiovascular: Normal rate and regular rhythm.   Pulmonary/Chest: Effort normal and breath sounds normal. No respiratory distress.  Musculoskeletal: Normal range of motion.  Neurological: She is alert and oriented to person, place, and time.  Skin: Skin is warm and dry.  Psychiatric: She has a normal mood and affect. Her behavior is normal.  Nursing note and vitals reviewed.   ED Course  Procedures (including critical care time) DIAGNOSTIC STUDIES: Oxygen Saturation is 97% on RA, adequate by my interpretation.    COORDINATION OF CARE: 1:43 AM- Pt advised of plan for treatment and pt agrees.    Labs Review Labs Reviewed  COMPREHENSIVE METABOLIC PANEL - Abnormal; Notable for the following:    Glucose, Bld 149 (*)    BUN 28 (*)     Creatinine, Ser 1.37 (*)    Albumin 3.4 (*)    GFR calc non Af Amer 33 (*)    GFR calc Af Amer 38 (*)    All other components within normal limits  URINALYSIS, ROUTINE W REFLEX MICROSCOPIC - Abnormal; Notable for the following:    APPearance CLOUDY (*)    Leukocytes, UA SMALL (*)    All other components within normal limits  URINE MICROSCOPIC-ADD ON - Abnormal; Notable for the following:    Bacteria, UA FEW (*)    All other components within normal limits  CBC WITH DIFFERENTIAL    Imaging Review No results found.   EKG Interpretation None  MDM   Final diagnoses:  Mental status change    Patient is an 80 year old female brought for evaluation of confusion. She seemed to be "talking out of her head" this evening when she was going to bed. She also seemed disoriented more than usual. She is neurologically intact, although his somewhat confused and disoriented. CT scan of the head reveals no evidence for stroke and laboratory studies are essentially unremarkable.  I suspect her symptoms are related to stress from her recent pneumonia diagnosis and possibly the addition of an antibiotic to her medications. I discussed the case with Dr. Nicole Kindred from neurology who does not feel as though further workup is indicated. Patient will be discharged to home and has been advised to follow-up with her primary Dr. if not improving in the next 2 days.  I personally performed the services described in this documentation, which was scribed in my presence. The recorded information has been reviewed and is accurate.    Alicia Speak, MD 09/10/14 [phone]

## 2014-09-10 NOTE — ED Notes (Signed)
Family states that pt was diagnosed with pneumonia on 12/26, received a dose of rocephin. Pts daughter in law states that they did not fill her prescription for antibiotics until today. Daughter states that pts baseline is alert to self and [place] but is usually confused to city. Pt currently confused to own bday.

## 2014-09-18 ENCOUNTER — Emergency Department (HOSPITAL_COMMUNITY): Payer: Medicare Other

## 2014-09-18 ENCOUNTER — Encounter (HOSPITAL_COMMUNITY): Payer: Self-pay | Admitting: Emergency Medicine

## 2014-09-18 ENCOUNTER — Inpatient Hospital Stay (HOSPITAL_COMMUNITY)
Admission: EM | Admit: 2014-09-18 | Discharge: 2014-09-20 | DRG: 389 | Disposition: A | Discharge status: Home Health | Payer: Medicare Other | Attending: Family Medicine | Admitting: Family Medicine

## 2014-09-18 ENCOUNTER — Inpatient Hospital Stay (HOSPITAL_COMMUNITY): Payer: Medicare Other

## 2014-09-18 DIAGNOSIS — I6529 Occlusion and stenosis of unspecified carotid artery: Secondary | ICD-10-CM | POA: Diagnosis present

## 2014-09-18 DIAGNOSIS — G20 Parkinson's disease: Secondary | ICD-10-CM | POA: Diagnosis present

## 2014-09-18 DIAGNOSIS — D649 Anemia, unspecified: Secondary | ICD-10-CM | POA: Diagnosis present

## 2014-09-18 DIAGNOSIS — Z882 Allergy status to sulfonamides status: Secondary | ICD-10-CM

## 2014-09-18 DIAGNOSIS — F419 Anxiety disorder, unspecified: Secondary | ICD-10-CM | POA: Diagnosis present

## 2014-09-18 DIAGNOSIS — K5669 Other intestinal obstruction: Secondary | ICD-10-CM | POA: Diagnosis not present

## 2014-09-18 DIAGNOSIS — N184 Chronic kidney disease, stage 4 (severe): Secondary | ICD-10-CM | POA: Diagnosis present

## 2014-09-18 DIAGNOSIS — I471 Supraventricular tachycardia: Secondary | ICD-10-CM | POA: Diagnosis present

## 2014-09-18 DIAGNOSIS — K449 Diaphragmatic hernia without obstruction or gangrene: Secondary | ICD-10-CM | POA: Diagnosis present

## 2014-09-18 DIAGNOSIS — Z888 Allergy status to other drugs, medicaments and biological substances status: Secondary | ICD-10-CM

## 2014-09-18 DIAGNOSIS — K56600 Partial intestinal obstruction, unspecified as to cause: Secondary | ICD-10-CM

## 2014-09-18 DIAGNOSIS — Z9221 Personal history of antineoplastic chemotherapy: Secondary | ICD-10-CM

## 2014-09-18 DIAGNOSIS — Z9079 Acquired absence of other genital organ(s): Secondary | ICD-10-CM | POA: Diagnosis present

## 2014-09-18 DIAGNOSIS — E785 Hyperlipidemia, unspecified: Secondary | ICD-10-CM | POA: Diagnosis present

## 2014-09-18 DIAGNOSIS — K920 Hematemesis: Secondary | ICD-10-CM | POA: Diagnosis not present

## 2014-09-18 DIAGNOSIS — F0280 Dementia in other diseases classified elsewhere without behavioral disturbance: Secondary | ICD-10-CM | POA: Diagnosis present

## 2014-09-18 DIAGNOSIS — I129 Hypertensive chronic kidney disease with stage 1 through stage 4 chronic kidney disease, or unspecified chronic kidney disease: Secondary | ICD-10-CM | POA: Diagnosis present

## 2014-09-18 DIAGNOSIS — Z7982 Long term (current) use of aspirin: Secondary | ICD-10-CM | POA: Diagnosis not present

## 2014-09-18 DIAGNOSIS — Z8701 Personal history of pneumonia (recurrent): Secondary | ICD-10-CM | POA: Diagnosis not present

## 2014-09-18 DIAGNOSIS — M810 Age-related osteoporosis without current pathological fracture: Secondary | ICD-10-CM | POA: Diagnosis present

## 2014-09-18 DIAGNOSIS — Z79899 Other long term (current) drug therapy: Secondary | ICD-10-CM | POA: Diagnosis not present

## 2014-09-18 DIAGNOSIS — Z90710 Acquired absence of both cervix and uterus: Secondary | ICD-10-CM | POA: Diagnosis not present

## 2014-09-18 DIAGNOSIS — F329 Major depressive disorder, single episode, unspecified: Secondary | ICD-10-CM | POA: Diagnosis present

## 2014-09-18 DIAGNOSIS — Z91041 Radiographic dye allergy status: Secondary | ICD-10-CM

## 2014-09-18 DIAGNOSIS — Z66 Do not resuscitate: Secondary | ICD-10-CM | POA: Diagnosis present

## 2014-09-18 DIAGNOSIS — R109 Unspecified abdominal pain: Secondary | ICD-10-CM

## 2014-09-18 DIAGNOSIS — K219 Gastro-esophageal reflux disease without esophagitis: Secondary | ICD-10-CM | POA: Diagnosis present

## 2014-09-18 DIAGNOSIS — Z8543 Personal history of malignant neoplasm of ovary: Secondary | ICD-10-CM | POA: Diagnosis not present

## 2014-09-18 DIAGNOSIS — E119 Type 2 diabetes mellitus without complications: Secondary | ICD-10-CM | POA: Diagnosis present

## 2014-09-18 DIAGNOSIS — R1013 Epigastric pain: Secondary | ICD-10-CM | POA: Diagnosis not present

## 2014-09-18 DIAGNOSIS — R110 Nausea: Secondary | ICD-10-CM | POA: Diagnosis not present

## 2014-09-18 DIAGNOSIS — K56609 Unspecified intestinal obstruction, unspecified as to partial versus complete obstruction: Secondary | ICD-10-CM | POA: Diagnosis present

## 2014-09-18 DIAGNOSIS — Z881 Allergy status to other antibiotic agents status: Secondary | ICD-10-CM

## 2014-09-18 LAB — CBC WITH DIFFERENTIAL/PLATELET
Basophils Absolute: 0 10*3/uL (ref 0.0–0.1)
Basophils Relative: 0 % (ref 0–1)
EOS ABS: 0 10*3/uL (ref 0.0–0.7)
Eosinophils Relative: 0 % (ref 0–5)
HCT: 43.6 % (ref 36.0–46.0)
HEMOGLOBIN: 14.4 g/dL (ref 12.0–15.0)
LYMPHS ABS: 1 10*3/uL (ref 0.7–4.0)
LYMPHS PCT: 8 % — AB (ref 12–46)
MCH: 29.9 pg (ref 26.0–34.0)
MCHC: 33 g/dL (ref 30.0–36.0)
MCV: 90.6 fL (ref 78.0–100.0)
Monocytes Absolute: 0.5 10*3/uL (ref 0.1–1.0)
Monocytes Relative: 4 % (ref 3–12)
NEUTROS PCT: 88 % — AB (ref 43–77)
Neutro Abs: 11.5 10*3/uL — ABNORMAL HIGH (ref 1.7–7.7)
Platelets: 201 10*3/uL (ref 150–400)
RBC: 4.81 MIL/uL (ref 3.87–5.11)
RDW: 14.5 % (ref 11.5–15.5)
WBC: 13 10*3/uL — ABNORMAL HIGH (ref 4.0–10.5)

## 2014-09-18 LAB — COMPREHENSIVE METABOLIC PANEL
ALBUMIN: 3.4 g/dL — AB (ref 3.5–5.2)
ALT: 16 U/L (ref 0–35)
AST: 25 U/L (ref 0–37)
Alkaline Phosphatase: 104 U/L (ref 39–117)
Anion gap: 7 (ref 5–15)
BILIRUBIN TOTAL: 0.3 mg/dL (ref 0.3–1.2)
BUN: 28 mg/dL — ABNORMAL HIGH (ref 6–23)
CO2: 25 mmol/L (ref 19–32)
Calcium: 9.2 mg/dL (ref 8.4–10.5)
Chloride: 105 mEq/L (ref 96–112)
Creatinine, Ser: 1.3 mg/dL — ABNORMAL HIGH (ref 0.50–1.10)
GFR calc Af Amer: 41 mL/min — ABNORMAL LOW (ref >90)
GFR calc non Af Amer: 35 mL/min — ABNORMAL LOW (ref >90)
Glucose, Bld: 136 mg/dL — ABNORMAL HIGH (ref 70–99)
Potassium: 4.6 mmol/L (ref 3.5–5.1)
Sodium: 137 mmol/L (ref 135–145)
Total Protein: 6.7 g/dL (ref 6.0–8.3)

## 2014-09-18 LAB — I-STAT CHEM 8, ED
BUN: 36 mg/dL — ABNORMAL HIGH (ref 6–23)
CHLORIDE: 102 meq/L (ref 96–112)
CREATININE: 1.2 mg/dL — AB (ref 0.50–1.10)
Calcium, Ion: 1.16 mmol/L (ref 1.13–1.30)
Glucose, Bld: 142 mg/dL — ABNORMAL HIGH (ref 70–99)
HCT: 47 % — ABNORMAL HIGH (ref 36.0–46.0)
Hemoglobin: 16 g/dL — ABNORMAL HIGH (ref 12.0–15.0)
POTASSIUM: 4.6 mmol/L (ref 3.5–5.1)
Sodium: 137 mmol/L (ref 135–145)
TCO2: 23 mmol/L (ref 0–100)

## 2014-09-18 LAB — URINALYSIS, ROUTINE W REFLEX MICROSCOPIC
Bilirubin Urine: NEGATIVE
Glucose, UA: NEGATIVE mg/dL
Hgb urine dipstick: NEGATIVE
KETONES UR: NEGATIVE mg/dL
LEUKOCYTES UA: NEGATIVE
NITRITE: NEGATIVE
Protein, ur: NEGATIVE mg/dL
Specific Gravity, Urine: 1.022 (ref 1.005–1.030)
Urobilinogen, UA: 0.2 mg/dL (ref 0.0–1.0)
pH: 5 (ref 5.0–8.0)

## 2014-09-18 LAB — TROPONIN I

## 2014-09-18 LAB — TYPE AND SCREEN
ABO/RH(D): B POS
Antibody Screen: NEGATIVE

## 2014-09-18 LAB — LIPASE, BLOOD: Lipase: 50 U/L (ref 11–59)

## 2014-09-18 LAB — POC OCCULT BLOOD, ED: Fecal Occult Bld: NEGATIVE

## 2014-09-18 LAB — ABO/RH: ABO/RH(D): B POS

## 2014-09-18 LAB — I-STAT CG4 LACTIC ACID, ED: Lactic Acid, Venous: 1.66 mmol/L (ref 0.5–2.2)

## 2014-09-18 MED ORDER — HEPARIN SODIUM (PORCINE) 5000 UNIT/ML IJ SOLN
5000.0000 [IU] | Freq: Three times a day (TID) | INTRAMUSCULAR | Status: DC
  Administered 2014-09-18 – 2014-09-20 (×6): 5000 [IU] via SUBCUTANEOUS
  Filled 2014-09-18 (×8): qty 1

## 2014-09-18 MED ORDER — SODIUM CHLORIDE 0.9 % IJ SOLN
3.0000 mL | Freq: Two times a day (BID) | INTRAMUSCULAR | Status: DC
  Administered 2014-09-19 – 2014-09-20 (×2): 3 mL via INTRAVENOUS

## 2014-09-18 MED ORDER — ONDANSETRON HCL 4 MG/2ML IJ SOLN
4.0000 mg | Freq: Once | INTRAMUSCULAR | Status: AC
  Administered 2014-09-18: 4 mg via INTRAVENOUS
  Filled 2014-09-18: qty 2

## 2014-09-18 MED ORDER — ACETAMINOPHEN 650 MG RE SUPP: 650.0000 mg | Freq: Four times a day (QID) | RECTAL | Status: DC | PRN

## 2014-09-18 MED ORDER — DEXTROSE-NACL 5-0.45 % IV SOLN
INTRAVENOUS | Status: DC
  Administered 2014-09-18 – 2014-09-20 (×3): via INTRAVENOUS

## 2014-09-18 MED ORDER — ACETAMINOPHEN 325 MG PO TABS: 650.0000 mg | ORAL_TABLET | Freq: Four times a day (QID) | ORAL | Status: DC | PRN

## 2014-09-18 NOTE — ED Notes (Signed)
Dr. Betsey Holiday attempted U/S guided IV. Unsuccessful. IV team arrived at bedside.

## 2014-09-18 NOTE — ED Notes (Signed)
IV access attempted by EMS and twice by this nurse; Unsuccessful. Contacting IV team.

## 2014-09-18 NOTE — ED Notes (Signed)
Finished with PO contrast. Notified CT. 

## 2014-09-18 NOTE — ED Provider Notes (Signed)
CSN: [id number]     Arrival date & time 09/18/14  1246 History   First MD Initiated Contact with Patient 09/18/14 1248     Chief Complaint  Patient presents with  . Emesis     (Consider location/radiation/quality/duration/timing/severity/associated sxs/prior Treatment) HPI Comments: Patient presents to the ER for evaluation of nausea and vomiting. Patient had an episode of vomiting that was described as looking like coffee grounds earlier today. Patient is brought to the ER by ambulance. Upon arrival to the ER, patient reports discomfort in the epigastric region, has no other complaints. Has not had any chest pain or shortness of breath.  Patient is a 81 y.o. female presenting with vomiting.  Emesis Associated symptoms: abdominal pain     Past Medical History  Diagnosis Date  . Dementia     significant  . Renal disease   . Memory loss 12/10/2012  . Depression 12/10/2012  . Anxiety state, unspecified 12/10/2012  . Spells 12/10/2012  . Essential hypertension, benign 12/10/2012  . Other and unspecified hyperlipidemia 12/10/2012  . Loss of weight 12/10/2012  . Carotid artery stenosis   . Ovarian cancer     treated with surgery & chemo  . Ankle fracture, right   . Unstable gait     mild  . Syncope 12/10/2012    Recurrent, sporadic episodes  . Carotid artery disease   . Parkinson disease   . Diabetes mellitus without complication    Past Surgical History  Procedure Laterality Date  . Vesicovaginal fistula closure w/ tah  2005  . Oophorectomy  2007    for ovarian cancer  . Prolapsed uterine fibroid ligation    . Appendectomy    . Cataract extraction Bilateral   . Implantable loop recorder  11/14    MDT LINQ implanted for recurrent unexplained syncope by Dr Rayann Heman  . Abdominal hysterectomy    . Ankle surgery Left   . Loop recorder implant N/A 07/17/2013    Procedure: LOOP RECORDER IMPLANT;  Surgeon: Coralyn Mark, MD;  Location: [facility];  Service: Cardiovascular;  Laterality:  N/A;   Family History  Problem Relation Age of Onset  . Cancer Mother     breast  . Stroke Father    History  Substance Use Topics  . Smoking status: Never Smoker   . Smokeless tobacco: Never Used  . Alcohol Use: No   OB History    No data available     Review of Systems  Gastrointestinal: Positive for vomiting and abdominal pain.  All other systems reviewed and are negative.     Allergies  Iohexol; Ativan; Ciprofloxacin; Nsaids; Sulfa antibiotics; and Ivp dye  Home Medications   Prior to Admission medications   Medication Sig Start Date End Date Taking? Authorizing Provider  Albuterol Sulfate (PROAIR RESPICLICK) 474 (90 BASE) MCG/ACT AEPB Inhale 2 puffs into the lungs every 4 (four) hours as needed (for shortness of breath).  05/08/14   Historical Provider, MD  amLODipine (NORVASC) 2.5 MG tablet Take 2.5 mg by mouth daily.    Historical Provider, MD  aspirin EC 81 MG tablet Take 81 mg by mouth daily.    Historical Provider, MD  atorvastatin (LIPITOR) 20 MG tablet Take 20 mg by mouth at bedtime.     Historical Provider, MD  calcium carbonate (OS-CAL) 600 MG TABS tablet Take 600 mg by mouth at bedtime.    Historical Provider, MD  doxycycline (VIBRAMYCIN) 100 MG capsule Take 1 capsule (100 mg total) by mouth  2 (two) times daily. 09/07/14   John L Molpus, MD  loratadine (CLARITIN) 10 MG tablet Take 10 mg by mouth as needed for allergies.  11/30/13   Historical Provider, MD  Memantine HCl ER (NAMENDA XR) 28 MG CP24 Take 28 mg by mouth daily. 04/02/14   Star Age, MD  Multiple Vitamin (MULTIVITAMIN WITH MINERALS) TABS Take 1 tablet by mouth daily.    Historical Provider, MD  polyethylene glycol (MIRALAX / GLYCOLAX) packet Take 17 g by mouth daily as needed for mild constipation. With 8 oz of liquid daily. 12/24/13   Eugenie Filler, MD  promethazine (PHENERGAN) 12.5 MG tablet Take 1 tablet (12.5 mg total) by mouth every 8 (eight) hours as needed for nausea or vomiting. 06/12/14    Osa Craver, MD  sertraline (ZOLOFT) 50 MG tablet Take 50 mg by mouth daily.    Historical Provider, MD  sodium phosphate (FLEET) 7-19 GM/118ML ENEM Place 133 mLs (1 enema total) rectally daily as needed for severe constipation. 06/20/14   Renne Musca, MD   BP 150/52 mmHg  Pulse 84  Temp(Src) 97.7 F (36.5 C) (Oral)  Resp 17  SpO2 99% Physical Exam  Constitutional: She is oriented to person, place, and time. She appears well-developed and well-nourished. No distress.  HENT:  Head: Normocephalic and atraumatic.  Right Ear: Hearing normal.  Left Ear: Hearing normal.  Nose: Nose normal.  Mouth/Throat: Oropharynx is clear and moist and mucous membranes are normal.  Eyes: Conjunctivae and EOM are normal. Pupils are equal, round, and reactive to light.  Neck: Normal range of motion. Neck supple.  Cardiovascular: Regular rhythm, S1 normal and S2 normal.  Exam reveals no gallop and no friction rub.   No murmur heard. Pulmonary/Chest: Effort normal and breath sounds normal. No respiratory distress. She exhibits no tenderness.  Abdominal: Soft. Normal appearance and bowel sounds are normal. There is no hepatosplenomegaly. There is tenderness in the epigastric area. There is no rebound, no guarding, no tenderness at McBurney's point and negative Murphy's sign. No hernia.  Musculoskeletal: Normal range of motion.  Neurological: She is alert and oriented to person, place, and time. She has normal strength. No cranial nerve deficit or sensory deficit. Coordination normal. GCS eye subscore is 4. GCS verbal subscore is 5. GCS motor subscore is 6.  Skin: Skin is warm, dry and intact. No rash noted. No cyanosis.  Psychiatric: She has a normal mood and affect. Her speech is normal and behavior is normal. Thought content normal.  Nursing note and vitals reviewed.   ED Course  Procedures (including critical care time) Labs Review Labs Reviewed  CBC WITH DIFFERENTIAL - Abnormal; Notable for the  following:    WBC 13.0 (*)    Neutrophils Relative % 88 (*)    Neutro Abs 11.5 (*)    Lymphocytes Relative 8 (*)    All other components within normal limits  COMPREHENSIVE METABOLIC PANEL - Abnormal; Notable for the following:    Glucose, Bld 136 (*)    BUN 28 (*)    Creatinine, Ser 1.30 (*)    Albumin 3.4 (*)    GFR calc non Af Amer 35 (*)    GFR calc Af Amer 41 (*)    All other components within normal limits  I-STAT CHEM 8, ED - Abnormal; Notable for the following:    BUN 36 (*)    Creatinine, Ser 1.20 (*)    Glucose, Bld 142 (*)    Hemoglobin 16.0 (*)  HCT 47.0 (*)    All other components within normal limits  LIPASE, BLOOD  URINALYSIS, ROUTINE W REFLEX MICROSCOPIC  TROPONIN I  I-STAT CG4 LACTIC ACID, ED  POC OCCULT BLOOD, ED  TYPE AND SCREEN  ABO/RH    Imaging Review Ct Abdomen Pelvis Wo Contrast  09/18/2014   CLINICAL DATA:  Coffee-ground emesis earlier today while at home. Prior history of ovarian cancer. Current history of dementia. Surgical history includes appendectomy, hysterectomy and bilateral salpingo-oophorectomy.  EXAM: CT ABDOMEN AND PELVIS WITHOUT CONTRAST  TECHNIQUE: Multidetector CT imaging of the abdomen and pelvis was performed following the standard protocol without IV contrast.  COMPARISON:  06/19/2014, 06/13/2014, 12/18/2003.  FINDINGS: Moderate dilation of multiple loops of small bowel in the abdomen and pelvis. The duodenum and proximal jejunum is not dilated and the distal and terminal ileum are not dilated. There is an abrupt angulation of the distal ileum in the right upper quadrant, best seen on the coronal reformatted images (image 46). There is transition from normal caliber contrast filled small intestine to dilated bowel in the midline of the upper pelvis, also best seen on the coronal images (image 36). Inspissated stool like material is present within the dilated ileum. There is no free intraperitoneal air. There is no ascites. The pattern is  similar to that seen on the prior CT in October, 2015.  Moderately large hiatal hernia, unchanged. Oral contrast material within the visualized lower esophagus. Stomach otherwise unremarkable, filled with food. Entire colon relatively decompressed. Diffuse colonic diverticulosis, greatest in the sigmoid colon, without evidence of acute diverticulitis. Appendix surgically absent.  Normal unenhanced appearance of the liver, spleen, and adrenal glands. Mild pancreatic atrophy consistent with age, unchanged. Mildly atrophic left kidney with compensatory hypertrophy of the right kidney, unchanged. Interval marked improvement in the right hydronephrosis. No urinary tract calculi on either side. Within the limits of the unenhanced technique, no focal parenchymal abnormality involving either kidney. Extensive aortoiliofemoral atherosclerosis. No significant lymphadenopathy.  Uterus and ovaries surgically absent. No adnexal masses or free pelvic fluid. Urinary bladder decompressed and unremarkable.  Bone window images demonstrate lower thoracic spondylosis, multilevel degenerative disc disease, spondylosis and facet degenerative changes throughout the lumbar spine. Visualized lung bases clear apart from the expected dependent atelectasis in the lower lobes, right greater than left. Heart mildly enlarged with moderate to severe 3 vessel coronary atherosclerosis and mitral annular calcification.  IMPRESSION: 1. Dilated distal jejunum and proximal and mid ileum, with normal caliber proximal jejunum and distal ileum. A closed loop obstruction can have this appearance. However, it is possible though this just represents a very long hypertonic segment of bowel, as the pattern is similar to that seen on the CTs in October, 2015. 2. No free intraperitoneal air. 3. Stable moderately large hiatal hernia. 4. Diffuse colonic diverticulosis without evidence of acute diverticulitis. 5. Improved right hydronephrosis since the prior CTs. 6.  Stable mild left renal atrophy.   Electronically Signed   By: Evangeline Dakin M.D.   On: 09/18/2014 16:53     EKG Interpretation   Date/Time:  Thursday September 18 2014 13:06:17 EST Ventricular Rate:  81 PR Interval:  170 QRS Duration: 126 QT Interval:  435 QTC Calculation: 505 R Axis:   48 Text Interpretation:  Sinus rhythm Right bundle branch block No  significant change since last tracing Confirmed by Jaedah Lords  MD,  Daishon Chui [phone]) on 09/18/2014 3:54:55 PM      MDM   Final diagnoses:  Abdominal pain  Patient brought to the ER for evaluation of nausea and vomiting. There was concern over the possibility of GI bleeding because the vomitus appeared coffee-ground in nature. She has not had any vomiting here in the ER for testing. Rectal exam, however, reveals heme-negative stool. Vital signs have been stable, no hypotension. She has a stable hemoglobin compared to previous visits. All lab work was unremarkable including LFTs and cardiac evaluation.   Daughter reports that the patient has had a history of small bowel obstruction. She also has had a problem with fecal impactions. Rectal exam did not reveal any impaction. Patient sent for CT scan to further evaluate for possible intra-abdominal pathology based on her symptoms. Findings are concerning for small bowel obstruction. This is similar to when she was hospitalized 2 months ago for small bowel obstruction. Patient will therefore be admitted for further management.  Orpah Greek, MD 09/18/14 [phone]

## 2014-09-18 NOTE — H&P (Signed)
[facility] Admission History and Physical Service Pager: [phone]  Patient name: Alicia [name] Medical record number: [id number] Date of birth: [birth year] Age: 81 y.o. Gender: female  Primary Care Provider: Shellia Carwin, PA-C Consultants: none Code Status: full for now, has been DNR in past so once able to talk with family will confirm DNR status  Chief Complaint: coffee ground emesis  Assessment and Plan: Alicia [name] is a 81 y.o. female presenting with report of coffee ground emesis. PMH is significant for GERD, depression/anxiety, dementia, HTN, HLD, ovarian cancer, hx osteoporosis, CKD stage IV, and SBO in Oct 2015.  # Coffee ground emesis: Patient unable to provide any meaningful hx to me. CT abd showing evidence of possible SBO, although similar in appearance to imaging in Oct so unclear if this is a chronic finding. No peritoneal signs at present, no signs of surgical abdomen. KUB did not show evidence of a small bowel obstruction. With report of coffee-ground emesis, and history of SBO, certainly reasonable to admit for observation and monitoring of lab work. -admit to telemetry unit -NPO, hydrate with IVF -if vomits again place NG tube -serial abd exams, repeat KUB if worsens or not improving -trend CBC -2 large bore IV's, type & screen -obtain more hx from family when able  # Depression/anxiety:  - holding home zoloft while NPO, resume as soon as able to avoid withdrawal sx  # Dementia: MMSE 18/30 in July 2015 per neuro note - hold home namenda while NPO  # HTN:  - hold home amlodipine - prn hydralazine for elevated BP  # HLD:  -holding lipitor while NPO  # Ovarian cancer: listed on problem list, however only available path report shows endometrial cancer hx. Mention in chart that ovarian cancer is s/p chemo in 2006, managed by Dr. Rhodia Albright at [facility].  # Osteoporosis: holding Os-Cal while NPO  # CKD stage IV -renally dose  medications - trend cr while on IVF  FEN/GI: D5-1/2NS @ 75 cc/hr, NPO Prophylaxis: SQ heparin  Disposition: admit to telemetry, dispo pending clinical improvement  History of Present Illness: Alicia [name] is a 81 y.o. female presenting with reported coffee ground emesis. Patient is demented and unable to provide me with any meaningful history. She does not have any family present with her. The only history I have is what was told to me by the emergency room provider, and what is available in the chart. Level V caveat applies. Patient does deny being in pain at this time. She is oriented to person only.  Review Of Systems: Unable to perform, level V caveat  Patient Active Problem List   Diagnosis Date Noted  . SBO (small bowel obstruction) 09/18/2014  . Partial small bowel obstruction 06/13/2014  . Sliding hiatal hernia 06/13/2014  . Diabetes mellitus without complication   . Nausea 06/12/2014  . Paroxysmal supraventricular tachycardia 06/12/2014  . History of syncope 06/12/2014  . GERD (gastroesophageal reflux disease) 06/12/2014  . Paralysis agitans 01/28/2014  . Urinary tract infection 01/12/2014  . Dementia 01/09/2014  . HCAP (healthcare-associated pneumonia) 12/19/2013  . Pneumonia 12/19/2013  . Nausea & vomiting 12/19/2013  . HLD (hyperlipidemia) 12/19/2013  . Cancer of ovary 11/30/2013  . OP (osteoporosis) 11/30/2013  . Chronic kidney disease (CKD), stage IV (severe) 11/30/2013  . Memory loss 12/10/2012  . Depression 12/10/2012  . Anxiety state, unspecified 12/10/2012  . Syncope 12/10/2012  . Spells 12/10/2012  . Essential hypertension, benign 12/10/2012  . Other and  unspecified hyperlipidemia 12/10/2012  . Loss of weight 12/10/2012   Past Medical History: Past Medical History  Diagnosis Date  . Dementia     significant  . Renal disease   . Memory loss 12/10/2012  . Depression 12/10/2012  . Anxiety state, unspecified 12/10/2012  . Spells 12/10/2012  .  Essential hypertension, benign 12/10/2012  . Other and unspecified hyperlipidemia 12/10/2012  . Loss of weight 12/10/2012  . Carotid artery stenosis   . Ovarian cancer     treated with surgery & chemo  . Ankle fracture, right   . Unstable gait     mild  . Syncope 12/10/2012    Recurrent, sporadic episodes  . Carotid artery disease   . Parkinson disease   . Diabetes mellitus without complication    Past Surgical History: Past Surgical History  Procedure Laterality Date  . Vesicovaginal fistula closure w/ tah  2005  . Oophorectomy  2007    for ovarian cancer  . Prolapsed uterine fibroid ligation    . Appendectomy    . Cataract extraction Bilateral   . Implantable loop recorder  11/14    MDT LINQ implanted for recurrent unexplained syncope by Dr Rayann Heman  . Abdominal hysterectomy    . Ankle surgery Left   . Loop recorder implant N/A 07/17/2013    Procedure: LOOP RECORDER IMPLANT;  Surgeon: Coralyn Mark, MD;  Location: [place] CATH LAB;  Service: Cardiovascular;  Laterality: N/A;   Social History: History  Substance Use Topics  . Smoking status: Never Smoker   . Smokeless tobacco: Never Used  . Alcohol Use: No   Additional social history: None  Please also refer to relevant sections of EMR.  Family History: Family History  Problem Relation Age of Onset  . Cancer Mother     breast  . Stroke Father    Allergies and Medications: Allergies  Allergen Reactions  . Iohexol Rash  . Ativan [Lorazepam] Other (See Comments)    MAKES HER AGITATED  . Ciprofloxacin Other (See Comments)    weakness  . Nsaids Other (See Comments)    Renal insufficiency  . Sulfa Antibiotics Hives and Itching  . Ivp Dye [Iodinated Diagnostic Agents] Rash   No current facility-administered medications on file prior to encounter.   Current Outpatient Prescriptions on File Prior to Encounter  Medication Sig Dispense Refill  . Albuterol Sulfate (PROAIR RESPICLICK) 924 (90 BASE) MCG/ACT AEPB Inhale 2  puffs into the lungs every 4 (four) hours as needed (for shortness of breath).     Marland Kitchen amLODipine (NORVASC) 2.5 MG tablet Take 2.5 mg by mouth daily.    Marland Kitchen aspirin EC 81 MG tablet Take 81 mg by mouth daily.    Marland Kitchen atorvastatin (LIPITOR) 20 MG tablet Take 20 mg by mouth at bedtime.     . calcium carbonate (OS-CAL) 600 MG TABS tablet Take 600 mg by mouth at bedtime.    Marland Kitchen doxycycline (VIBRAMYCIN) 100 MG capsule Take 1 capsule (100 mg total) by mouth 2 (two) times daily. 20 capsule 0  . loratadine (CLARITIN) 10 MG tablet Take 10 mg by mouth as needed for allergies.     . Memantine HCl ER (NAMENDA XR) 28 MG CP24 Take 28 mg by mouth daily. 90 capsule 3  . Multiple Vitamin (MULTIVITAMIN WITH MINERALS) TABS Take 1 tablet by mouth daily.    . polyethylene glycol (MIRALAX / GLYCOLAX) packet Take 17 g by mouth daily as needed for mild constipation. With 8 oz of liquid daily.    Marland Kitchen  promethazine (PHENERGAN) 12.5 MG tablet Take 1 tablet (12.5 mg total) by mouth every 8 (eight) hours as needed for nausea or vomiting. 30 tablet 0  . sertraline (ZOLOFT) 50 MG tablet Take 50 mg by mouth daily.    . sodium phosphate (FLEET) 7-19 GM/118ML ENEM Place 133 mLs (1 enema total) rectally daily as needed for severe constipation. 1 Bottle 0  . [DISCONTINUED] famotidine (PEPCID) 20 MG tablet Take 1 tablet (20 mg total) by mouth 2 (two) times daily. 10 tablet 0    Objective: BP 175/75 mmHg  Pulse 88  Temp(Src) 97.7 F (36.5 C) (Oral)  Resp 20  Ht 5\' 4"  (1.626 m)  Wt 133 lb 3.2 oz (60.419 kg)  BMI 22.85 kg/m2  SpO2 99% Exam: General: NAD, lying in bed, pleasantly demented HEENT: NCAT Cardiovascular: RRR no murmurs Respiratory: CTAB NWOB Abdomen: moderately distended. No peritoneal signs or rebound. + tympanic. NABS. No organomegaly appreciated. Extremities: No appreciable lower extremity edema bilaterally  Skin: no rashes noted Neuro: oriented to person only. Unable to say what kind of building she is in (hospital),  even when I give her the answer and ask her to recall it a few minutes later. Follows commands, moves all extremities spontaneously.  Labs and Imaging: CBC BMET   Recent Labs Lab 09/18/14 1530  WBC 13.0*  HGB 14.4  HCT 43.6  PLT 201    Recent Labs Lab 09/18/14 1530  NA 137  K 4.6  CL 105  CO2 25  BUN 28*  CREATININE 1.30*  GLUCOSE 136*  CALCIUM 9.2     KUB: IMPRESSION: Negative abdominal radiographs. No plain film evidence of small bowel obstruction. No acute cardiopulmonary disease.  CT Abd: IMPRESSION: 1. Dilated distal jejunum and proximal and mid ileum, with normal caliber proximal jejunum and distal ileum. A closed loop obstruction can have this appearance. However, it is possible though this just represents a very long hypertonic segment of bowel, as the pattern is similar to that seen on the CTs in October, 2015. 2. No free intraperitoneal air. 3. Stable moderately large hiatal hernia. 4. Diffuse colonic diverticulosis without evidence of acute diverticulitis. 5. Improved right hydronephrosis since the prior CTs. 6. Stable mild left renal atrophy.   Leeanne Rio, MD 09/18/2014, 9:22 PM PGY-3, [place] Intern pager: [phone], text pages welcome

## 2014-09-18 NOTE — Progress Notes (Signed)
Return call from Dr. Ardelia Mems, informed that pt. Had arrived to 5W 23.

## 2014-09-18 NOTE — ED Notes (Signed)
Patient transported to CT 

## 2014-09-18 NOTE — ED Notes (Signed)
Patient returned from CT

## 2014-09-18 NOTE — ED Notes (Signed)
Vomited x 1 at home; coffee ground emesis with foul odor per family. Hx: dementia, ESRD. Not on blood thinners per EMS.

## 2014-09-18 NOTE — Progress Notes (Signed)
Pt. Arrived from ED via stretcher.  Placed in bed.  VSS - Blood pressure 175/75, pulse 88, temperature 97.7 F (36.5 C), temperature source Oral, resp. rate 20, height 5\' 4"  (1.626 m), weight 60.419 kg (133 lb 3.2 oz), SpO2 99 %., R/A. Placed on telemetry box 5W TX23.  Paged [phone] to inform of pt. arrival.  Will continue to monitor.  Alphonzo Lemmings, RN

## 2014-09-18 NOTE — ED Notes (Signed)
Daughter called for update; made her aware of diagnoses and admission to [phone]. She stated she would be back to hospital after her son's basketball game at about 2100.

## 2014-09-18 NOTE — ED Notes (Signed)
Family has arrived; at bedside and will assist with PO contrast.

## 2014-09-18 NOTE — ED Notes (Signed)
In hallway bed at this time waiting for a room.

## 2014-09-19 DIAGNOSIS — K920 Hematemesis: Secondary | ICD-10-CM | POA: Insufficient documentation

## 2014-09-19 DIAGNOSIS — F0390 Unspecified dementia without behavioral disturbance: Secondary | ICD-10-CM

## 2014-09-19 DIAGNOSIS — K5669 Other intestinal obstruction: Secondary | ICD-10-CM

## 2014-09-19 LAB — CBC
HCT: 36.4 % (ref 36.0–46.0)
Hemoglobin: 12.1 g/dL (ref 12.0–15.0)
MCH: 30 pg (ref 26.0–34.0)
MCHC: 33.2 g/dL (ref 30.0–36.0)
MCV: 90.3 fL (ref 78.0–100.0)
Platelets: 218 10*3/uL (ref 150–400)
RBC: 4.03 MIL/uL (ref 3.87–5.11)
RDW: 14.6 % (ref 11.5–15.5)
WBC: 6.3 10*3/uL (ref 4.0–10.5)

## 2014-09-19 LAB — BASIC METABOLIC PANEL
Anion gap: 7 (ref 5–15)
BUN: 23 mg/dL (ref 6–23)
CHLORIDE: 104 meq/L (ref 96–112)
CO2: 24 mmol/L (ref 19–32)
Calcium: 8.3 mg/dL — ABNORMAL LOW (ref 8.4–10.5)
Creatinine, Ser: 1.35 mg/dL — ABNORMAL HIGH (ref 0.50–1.10)
GFR calc Af Amer: 39 mL/min — ABNORMAL LOW (ref >90)
GFR calc non Af Amer: 34 mL/min — ABNORMAL LOW (ref >90)
GLUCOSE: 120 mg/dL — AB (ref 70–99)
POTASSIUM: 4.1 mmol/L (ref 3.5–5.1)
SODIUM: 135 mmol/L (ref 135–145)

## 2014-09-19 LAB — GLUCOSE, CAPILLARY
GLUCOSE-CAPILLARY: 90 mg/dL (ref 70–99)
Glucose-Capillary: 116 mg/dL — ABNORMAL HIGH (ref 70–99)

## 2014-09-19 MED ORDER — ATORVASTATIN CALCIUM 20 MG PO TABS
20.0000 mg | ORAL_TABLET | Freq: Every day | ORAL | Status: DC
  Administered 2014-09-19 – 2014-09-20 (×2): 20 mg via ORAL
  Filled 2014-09-19 (×2): qty 1

## 2014-09-19 MED ORDER — ASPIRIN EC 81 MG PO TBEC
81.0000 mg | DELAYED_RELEASE_TABLET | Freq: Every day | ORAL | Status: DC
  Administered 2014-09-19 – 2014-09-20 (×2): 81 mg via ORAL
  Filled 2014-09-19 (×2): qty 1

## 2014-09-19 MED ORDER — MEMANTINE HCL ER 28 MG PO CP24
28.0000 mg | ORAL_CAPSULE | Freq: Every day | ORAL | Status: DC
  Administered 2014-09-19 – 2014-09-20 (×2): 28 mg via ORAL
  Filled 2014-09-19 (×2): qty 1

## 2014-09-19 MED ORDER — SERTRALINE HCL 50 MG PO TABS
50.0000 mg | ORAL_TABLET | Freq: Every day | ORAL | Status: DC
  Administered 2014-09-19 – 2014-09-20 (×2): 50 mg via ORAL
  Filled 2014-09-19 (×2): qty 1

## 2014-09-19 MED ORDER — CALCIUM CARBONATE 1250 (500 CA) MG PO TABS
1.0000 | ORAL_TABLET | Freq: Every day | ORAL | Status: DC
  Administered 2014-09-19 – 2014-09-20 (×2): 500 mg via ORAL
  Filled 2014-09-19 (×2): qty 1

## 2014-09-19 NOTE — Progress Notes (Signed)
Family Medicine Teaching Service Daily Progress Note Intern Pager: [phone]  Patient name: Alicia [name] Medical record number: [id number] Date of birth: 1922-10-08 Age: 81 y.o. Gender: female  Primary Care Provider: Shellia Carwin, PA-C Consultants: None Code Status: DNR (per family)  Pt Overview and Major Events to Date:  1/7: Admitted for concern of SBO  Assessment and Plan: Alicia [name] is a 81 y.o. female presenting with report of coffee ground emesis. PMH is significant for GERD, depression/anxiety, dementia, HTN, HLD, ovarian cancer, hx osteoporosis, CKD stage IV, and SBO in Oct 2015.  # Coffee ground emesis: Patient unable to provide any meaningful hx to me. CT abd showing evidence of possible SBO, although similar in appearance to imaging in Oct so unclear if this is a chronic finding. No peritoneal signs at present, no signs of surgical abdomen. KUB did not show evidence of a small bowel obstruction. With report of coffee-ground emesis, and history of SBO will monitor. -NPO, hydrate with IVF -- will try and transition to liquids today and see how patient tolerates -SLP study ordered due to family concern of aspiration -if vomits again will place NG tube -serial abd exams, repeat KUB if worsens or not improving -CBC nml -2 large bore IV's, type & screen  # Depression/anxiety:  - holding home zoloft while NPO, will resume as soon as able to avoid withdrawal sx  # Dementia: MMSE 18/30 in July 2015 per neuro note - hold home namenda while NPO -PT/OT ordered -will have conversations about goals of care  # HTN:  - hold home amlodipine - prn hydralazine for elevated BP  # HLD:  -holding lipitor while NPO  # Ovarian cancer: listed on problem list, however only available path report shows endometrial cancer hx. Mention in chart that ovarian cancer is s/p chemo in 2006, managed by Dr. Rhodia Albright at [facility]. Patient also had surgery to remove bilateral ovaries.   #  Osteoporosis: holding Os-Cal while NPO  # CKD stage IV -renally dose medications - trend cr while on IVF  FEN/GI: D5-1/2NS @ 75 cc/hr, NPO pending swallow study Prophylaxis: SQ heparin   Disposition: Home pending ability to tolerate diet without emesis.  Subjective:  No overnight events. No more episodes of vomiting reported. Per family she has not had any bleeding. Patient sleeping and follows commands.    Objective: Temp:  [97.7 F (36.5 C)-98.7 F (37.1 C)] 98.7 F (37.1 C) (01/08 0637) Pulse Rate:  [75-94] 75 (01/08 0637) Resp:  [14-20] 15 (01/08 0637) BP: (118-175)/(48-79) 118/48 mmHg (01/08 0637) SpO2:  [93 %-100 %] 98 % (01/08 0637) Weight:  [133 lb 3.2 oz (60.419 kg)] 133 lb 3.2 oz (60.419 kg) (01/07 1859) Physical Exam: General: NAD, lying in bed, pleasantly demented HEENT: NCAT, MMM Cardiovascular: RRR no murmurs Respiratory: CTAB NWOB Abdomen: soft, mildly distended. No peritoneal signs or rebound. NABS. No organomegaly appreciated. Non-tender. Extremities: No appreciable lower extremity edema bilaterally  Skin: no rashes noted Neuro: oriented to person only. Unable to say what kind of building she is in (hospital), even when I give her the answer and ask her to recall it a few minutes later. Follows commands, moves all extremities spontaneously.  Laboratory: Results for orders placed or performed during the hospital encounter of 09/18/14 (from the past 24 hour(s))  I-Stat CG4 Lactic Acid, ED     Status: None   Collection Time: 09/18/14  2:32 PM  Result Value Ref Range   Lactic Acid, Venous 1.66 0.5 -  2.2 mmol/L  I-Stat Chem 8, ED     Status: Abnormal   Collection Time: 09/18/14  3:13 PM  Result Value Ref Range   Sodium 137 135 - 145 mmol/L   Potassium 4.6 3.5 - 5.1 mmol/L   Chloride 102 96 - 112 mEq/L   BUN 36 (H) 6 - 23 mg/dL   Creatinine, Ser 1.20 (H) 0.50 - 1.10 mg/dL   Glucose, Bld 142 (H) 70 - 99 mg/dL   Calcium, Ion 1.16 1.13 - 1.30 mmol/L   TCO2  23 0 - 100 mmol/L   Hemoglobin 16.0 (H) 12.0 - 15.0 g/dL   HCT 47.0 (H) 36.0 - 46.0 %  CBC with Differential     Status: Abnormal   Collection Time: 09/18/14  3:30 PM  Result Value Ref Range   WBC 13.0 (H) 4.0 - 10.5 K/uL   RBC 4.81 3.87 - 5.11 MIL/uL   Hemoglobin 14.4 12.0 - 15.0 g/dL   HCT 43.6 36.0 - 46.0 %   MCV 90.6 78.0 - 100.0 fL   MCH 29.9 26.0 - 34.0 pg   MCHC 33.0 30.0 - 36.0 g/dL   RDW 14.5 11.5 - 15.5 %   Platelets 201 150 - 400 K/uL   Neutrophils Relative % 88 (H) 43 - 77 %   Neutro Abs 11.5 (H) 1.7 - 7.7 K/uL   Lymphocytes Relative 8 (L) 12 - 46 %   Lymphs Abs 1.0 0.7 - 4.0 K/uL   Monocytes Relative 4 3 - 12 %   Monocytes Absolute 0.5 0.1 - 1.0 K/uL   Eosinophils Relative 0 0 - 5 %   Eosinophils Absolute 0.0 0.0 - 0.7 K/uL   Basophils Relative 0 0 - 1 %   Basophils Absolute 0.0 0.0 - 0.1 K/uL  Comprehensive metabolic panel     Status: Abnormal   Collection Time: 09/18/14  3:30 PM  Result Value Ref Range   Sodium 137 135 - 145 mmol/L   Potassium 4.6 3.5 - 5.1 mmol/L   Chloride 105 96 - 112 mEq/L   CO2 25 19 - 32 mmol/L   Glucose, Bld 136 (H) 70 - 99 mg/dL   BUN 28 (H) 6 - 23 mg/dL   Creatinine, Ser 1.30 (H) 0.50 - 1.10 mg/dL   Calcium 9.2 8.4 - 10.5 mg/dL   Total Protein 6.7 6.0 - 8.3 g/dL   Albumin 3.4 (L) 3.5 - 5.2 g/dL   AST 25 0 - 37 U/L   ALT 16 0 - 35 U/L   Alkaline Phosphatase 104 39 - 117 U/L   Total Bilirubin 0.3 0.3 - 1.2 mg/dL   GFR calc non Af Amer 35 (L) >90 mL/min   GFR calc Af Amer 41 (L) >90 mL/min   Anion gap 7 5 - 15  Lipase, blood     Status: None   Collection Time: 09/18/14  3:30 PM  Result Value Ref Range   Lipase 50 11 - 59 U/L  Troponin I     Status: None   Collection Time: 09/18/14  3:30 PM  Result Value Ref Range   Troponin I <0.03 <0.031 ng/mL  Type and screen     Status: None   Collection Time: 09/18/14  3:37 PM  Result Value Ref Range   ABO/RH(D) B POS    Antibody Screen NEG    Sample Expiration 09/21/2014   ABO/Rh      Status: None   Collection Time: 09/18/14  3:37 PM  Result Value Ref Range  ABO/RH(D) B POS   POC occult blood, ED Provider will collect     Status: None   Collection Time: 09/18/14  3:38 PM  Result Value Ref Range   Fecal Occult Bld NEGATIVE NEGATIVE  Urinalysis, Routine w reflex microscopic     Status: None   Collection Time: 09/18/14  4:19 PM  Result Value Ref Range   Color, Urine YELLOW YELLOW   APPearance CLEAR CLEAR   Specific Gravity, Urine 1.022 1.005 - 1.030   pH 5.0 5.0 - 8.0   Glucose, UA NEGATIVE NEGATIVE mg/dL   Hgb urine dipstick NEGATIVE NEGATIVE   Bilirubin Urine NEGATIVE NEGATIVE   Ketones, ur NEGATIVE NEGATIVE mg/dL   Protein, ur NEGATIVE NEGATIVE mg/dL   Urobilinogen, UA 0.2 0.0 - 1.0 mg/dL   Nitrite NEGATIVE NEGATIVE   Leukocytes, UA NEGATIVE NEGATIVE  Basic metabolic panel     Status: Abnormal   Collection Time: 09/19/14  5:45 AM  Result Value Ref Range   Sodium 135 135 - 145 mmol/L   Potassium 4.1 3.5 - 5.1 mmol/L   Chloride 104 96 - 112 mEq/L   CO2 24 19 - 32 mmol/L   Glucose, Bld 120 (H) 70 - 99 mg/dL   BUN 23 6 - 23 mg/dL   Creatinine, Ser 1.35 (H) 0.50 - 1.10 mg/dL   Calcium 8.3 (L) 8.4 - 10.5 mg/dL   GFR calc non Af Amer 34 (L) >90 mL/min   GFR calc Af Amer 39 (L) >90 mL/min   Anion gap 7 5 - 15  CBC     Status: None   Collection Time: 09/19/14  5:45 AM  Result Value Ref Range   WBC 6.3 4.0 - 10.5 K/uL   RBC 4.03 3.87 - 5.11 MIL/uL   Hemoglobin 12.1 12.0 - 15.0 g/dL   HCT 36.4 36.0 - 46.0 %   MCV 90.3 78.0 - 100.0 fL   MCH 30.0 26.0 - 34.0 pg   MCHC 33.2 30.0 - 36.0 g/dL   RDW 14.6 11.5 - 15.5 %   Platelets 218 150 - 400 K/uL    Imaging/Diagnostic Tests: Ct Abdomen Pelvis Wo Contrast 09/18/2014  IMPRESSION: 1. Dilated distal jejunum and proximal and mid ileum, with normal caliber proximal jejunum and distal ileum. A closed loop obstruction can have this appearance. However, it is possible though this just represents a very long hypertonic  segment of bowel, as the pattern is similar to that seen on the CTs in October, 2015. 2. No free intraperitoneal air. 3. Stable moderately large hiatal hernia. 4. Diffuse colonic diverticulosis without evidence of acute diverticulitis. 5. Improved right hydronephrosis since the prior CTs. 6. Stable mild left renal atrophy.     Dg Abd Acute W/chest 09/18/2014 IMPRESSION: Negative abdominal radiographs. No plain film evidence of small bowel obstruction. No acute cardiopulmonary disease.     Katheren Shams, DO 09/19/2014, 7:20 AM PGY-1, [place] Intern pager: [phone], text pages welcome

## 2014-09-19 NOTE — Evaluation (Signed)
Physical Therapy Evaluation Patient Details Name: Alicia [name] MRN: [id number] DOB: [birth year] Today's Date: 09/19/2014   History of Present Illness    Alicia [name] is a 81 y.o. female presenting with report of coffee ground emesis. PMH is significant for GERD, depression/anxiety, dementia, HTN, HLD, ovarian cancer, hx osteoporosis, CKD stage IV, and SBO in Oct 2015.   Clinical Impression  Pt's daughter-in-law present at start of session and able to assist in answering history questions. Pt agreeable to ambulate with therapy and is a very pleasant woman. Daughter-in-law advised that pt is not a good judge of her own fatigue or limits due to her dementia. Pt requiring increased time to respond to react to questions or cues. Daughter-in-law stated pt was essentially independent with ADLs prior recent decline in health and hopes that pt can return to this status with the assistance of therapies.     Follow Up Recommendations Home health PT;Supervision/Assistance - 24 hour    Equipment Recommendations  None recommended by PT    Recommendations for Other Services OT consult     Precautions / Restrictions Precautions Precautions: Fall Restrictions Weight Bearing Restrictions: No      Mobility  Bed Mobility               General bed mobility comments: Pt sitting in chair at start of session.   Transfers Overall transfer level: Needs assistance Equipment used: 1 person hand held assist Transfers: Sit to/from Stand Sit to Stand: Min assist         General transfer comment: Pt able to transfer from recliner chair to standing with min assist. Pt standing requiring steadying initially.   Ambulation/Gait Ambulation/Gait assistance: Min assist Ambulation Distance (Feet): 80 Feet Assistive device: 1 person hand held assist Gait Pattern/deviations: Step-through pattern;Decreased step length - right;Decreased step length - left;Decreased stride length   Gait  velocity interpretation: at or above normal speed for age/gender General Gait Details: Pt with some unsteadiness with ambulation requiring 1 person HHA for safety. Pt requiring increased support and slowed down as she became more fatigued.   Stairs            Wheelchair Mobility    Modified Rankin (Stroke Patients Only)       Balance Overall balance assessment: Needs assistance         Standing balance support: During functional activity;Single extremity supported Standing balance-Leahy Scale: Fair Standing balance comment: Pt able to stand statically without support but requires something to steady her with ambulation                             Pertinent Vitals/Pain Pain Assessment: No/denies pain    Home Living Family/patient expects to be discharged to:: Private residence Living Arrangements: Children Available Help at Discharge: Family;Available 24 hours/day Type of Home: House Home Access: Stairs to enter Entrance Stairs-Rails: None Entrance Stairs-Number of Steps: 3 Home Layout: One level Home Equipment: Walker - 2 wheels;Bedside commode;Cane - single point Additional Comments: Pt lives with son and daughter in law.    Prior Function Level of Independence: Needs assistance   Gait / Transfers Assistance Needed: Pt walked without a devide in the house..  Outdoor and on steps she had assist by family.  No falls as daughter in law states that they are alwasys there to help.    ADL's / Homemaking Assistance Needed: daughter in law states people have helped her recently with bath and  dressing.          Hand Dominance   Dominant Hand: Right    Extremity/Trunk Assessment               Lower Extremity Assessment: Overall WFL for tasks assessed         Communication   Communication: No difficulties (requires increased time to respond and occassional repeating)  Cognition Arousal/Alertness: Awake/alert Behavior During Therapy: WFL for  tasks assessed/performed Overall Cognitive Status: History of cognitive impairments - at baseline                      General Comments      Exercises General Exercises - Lower Extremity Ankle Circles/Pumps: AROM;Both;10 reps;Seated Gluteal Sets: Strengthening;10 reps;Seated Long Arc Quad: AROM;Both;10 reps;Seated Hip ABduction/ADduction: AROM;Both;10 reps Hip Flexion/Marching: AROM;Both;10 reps;Seated      Assessment/Plan    PT Assessment Patient needs continued PT services  PT Diagnosis Difficulty walking   PT Problem List Decreased strength;Decreased activity tolerance;Decreased balance;Decreased mobility;Decreased knowledge of use of DME;Decreased safety awareness;Decreased knowledge of precautions;Decreased cognition  PT Treatment Interventions DME instruction;Gait training;Stair training;Functional mobility training;Therapeutic activities;Therapeutic exercise;Balance training;Patient/family education   PT Goals (Current goals can be found in the Care Plan section) Acute Rehab PT Goals Patient Stated Goal: None stated PT Goal Formulation: With patient/family Time For Goal Achievement: 10/03/14 Potential to Achieve Goals: Fair    Frequency Min 3X/week   Barriers to discharge        Co-evaluation               End of Session Equipment Utilized During Treatment: Gait belt Activity Tolerance: Patient tolerated treatment well Patient left: in chair;with call bell/phone within reach;with chair alarm set;with family/visitor present           Time: 3546-5681 PT Time Calculation (min) (ACUTE ONLY): 25 min   Charges:   PT Evaluation $Initial PT Evaluation Tier I: 1 Procedure PT Treatments $Gait Training: 8-22 mins   PT G CodesJearld Shines SPT 09/19/2014, 2:47 PM  Jearld Shines, [place]  Acute Rehabilitation [phone]

## 2014-09-19 NOTE — Progress Notes (Signed)
PT Cancellation Note  Patient Details Name: Alicia [name] MRN: [id number] DOB: Feb 01, 1923   Cancelled Treatment:    Reason Eval/Treat Not Completed: Other (comment) (Pt barely arousable and would not speak.  Will return as able.)Thanks.   Irwin Brakeman F 09/19/2014, 11:56 AM [facility] [phone] (pager)

## 2014-09-19 NOTE — Evaluation (Signed)
Clinical/Bedside Swallow Evaluation Patient Details  Name: Alicia [name] MRN: [id number] Date of Birth: [birth year]  Today's Date: 09/19/2014 Time: 2355-7322 SLP Time Calculation (min) (ACUTE ONLY): 31 min  Past Medical History:  Past Medical History  Diagnosis Date  . Dementia     significant  . Renal disease   . Memory loss 12/10/2012  . Depression 12/10/2012  . Anxiety state, unspecified 12/10/2012  . Spells 12/10/2012  . Essential hypertension, benign 12/10/2012  . Other and unspecified hyperlipidemia 12/10/2012  . Loss of weight 12/10/2012  . Carotid artery stenosis   . Ovarian cancer     treated with surgery & chemo  . Ankle fracture, right   . Unstable gait     mild  . Syncope 12/10/2012    Recurrent, sporadic episodes  . Carotid artery disease   . Parkinson disease   . Diabetes mellitus without complication    Past Surgical History:  Past Surgical History  Procedure Laterality Date  . Vesicovaginal fistula closure w/ tah  2005  . Oophorectomy  2007    for ovarian cancer  . Prolapsed uterine fibroid ligation    . Appendectomy    . Cataract extraction Bilateral   . Implantable loop recorder  11/14    MDT LINQ implanted for recurrent unexplained syncope by Dr Rayann Heman  . Abdominal hysterectomy    . Ankle surgery Left   . Loop recorder implant N/A 07/17/2013    Procedure: LOOP RECORDER IMPLANT;  Surgeon: Coralyn Mark, MD;  Location: [facility]ATH [facility];  Service: Cardiovascular;  Laterality: N/A;   HPI:  81 y.o. female with hx of dementia, GERD, HTN, SBO Oct 2015  admitted with vomiting, SBO.   Son and dtr-in-law report deterioration in swallow function over the course of the last few months, characterized by intermittent coughing with liquids and oral holding of solids.     Assessment / Plan / Recommendation Clinical Impression  Pt presents with functional oropharyngeal swallow with sufficient mastication, timely swallow response, and no overt s/s of aspiration.   Discussed with family dementia-based dysphagia, its fluctuating nature and dependence on pt's overall medical condition.  We discussed the contraindication of PEGs for persons with advanced dementia.  We discussed methods of minimizing aspiration risk and facilitating independence with eating.  For today, pt is doing quite well with swallowing and self-feeding, and she continues to derive great pleasure from eating.  Recommend continuing clear liquids until cleared by MD - then advance to a regular diet.  No SLP f/u is warranted.      Aspiration Risk  Mild    Diet Recommendation  (clear liquids due to SBO)   Liquid Administration via: Cup;Straw Medication Administration: Whole meds with liquid Supervision: Patient able to self feed;Staff to assist with self feeding    Other  Recommendations Oral Care Recommendations: Oral care BID   Follow Up Recommendations  None         Swallow Study Prior Functional Status  Type of Home: House Available Help at Discharge: Family;Available 24 hours/day    General Date of Onset: 09/18/14 HPI: 81 y.o. female with hx of dementia, GERD, HTN, SBO Oct 2015  admitted with vomiting, SBO.   Son and dtr-in-law report deterioration in swallow function over the course of the last few months, characterized by intermittent coughing with liquids and oral holding of solids.   Type of Study: Bedside swallow evaluation Previous Swallow Assessment: none per records Diet Prior to this Study: NPO Temperature Spikes Noted: No  Respiratory Status: Room air History of Recent Intubation: No Behavior/Cognition: Alert;Cooperative;Confused Oral Cavity - Dentition: Adequate natural dentition Self-Feeding Abilities: Able to feed self;Needs assist Patient Positioning: Upright in chair Baseline Vocal Quality: Clear (diplophonia) Volitional Cough: Strong Volitional Swallow: Able to elicit    Oral/Motor/Sensory Function Overall Oral Motor/Sensory Function: Appears within  functional limits for tasks assessed   Ice Chips Ice chips: Within functional limits Presentation: Spoon   Thin Liquid Thin Liquid: Within functional limits Presentation: Cup;Straw    Nectar Thick Nectar Thick Liquid: Not tested   Honey Thick Honey Thick Liquid: Not tested   Puree Puree: Within functional limits   Solid  Brodyn Depuy L. [place], [place] CCC/SLP Pager [phone]     Solid: Not tested (due to SBO)       Juan Quam Laurice 09/19/2014,3:52 PM

## 2014-09-19 NOTE — Care Management Note (Unsigned)
    Page 1 of 1   09/19/2014     4:10:52 PM CARE MANAGEMENT NOTE 09/19/2014  Patient:  KAMAREE, BERKEL   Account Number:  [id number]  Date Initiated:  09/19/2014  Documentation initiated by:  Tomi Bamberger  Subjective/Objective Assessment:   dx sbo  admit- lives with family.     Action/Plan:   pt eval- rec hhpt,   Anticipated DC Date:  09/21/2014   Anticipated DC Plan:  [place]  CM consult      [facility]  HOME HEALTH   Choice offered to / List presented to:  C-4 Adult Children        [place] arranged  HH-1 RN  [place].   Status of service:  In process, will continue to follow Medicare Important Message given?   (If response is "NO", the following Medicare IM given date fields will be blank) Date Medicare IM given:   Medicare IM given by:   Date Additional Medicare IM given:   Additional Medicare IM given by:    Discharge Disposition:    Per UR Regulation:  Reviewed for med. necessity/level of care/duration of stay  If discussed at [place] of Stay Meetings, dates discussed:    Comments:  09/19/14 [place], BSN [phone] NCM spoke with [name], patient 's relative, she states they would like to work with  [facility] for [facility], [place], [place], referral made to [facility] , [place] notified.  Soc will begin 24-48 hrs post dc.

## 2014-09-19 NOTE — Progress Notes (Signed)
Pt tolerating clear liquids at the moment. No complaints of nausea or vomitting. Will continue to monitor.

## 2014-09-20 DIAGNOSIS — R109 Unspecified abdominal pain: Secondary | ICD-10-CM | POA: Insufficient documentation

## 2014-09-20 DIAGNOSIS — K920 Hematemesis: Secondary | ICD-10-CM

## 2014-09-20 DIAGNOSIS — R1013 Epigastric pain: Secondary | ICD-10-CM

## 2014-09-20 DIAGNOSIS — R110 Nausea: Secondary | ICD-10-CM

## 2014-09-20 LAB — BASIC METABOLIC PANEL
ANION GAP: 9 (ref 5–15)
BUN: 20 mg/dL (ref 6–23)
CALCIUM: 8.5 mg/dL (ref 8.4–10.5)
CHLORIDE: 103 meq/L (ref 96–112)
CO2: 24 mmol/L (ref 19–32)
CREATININE: 1.34 mg/dL — AB (ref 0.50–1.10)
GFR, EST AFRICAN AMERICAN: 39 mL/min — AB (ref >90)
GFR, EST NON AFRICAN AMERICAN: 34 mL/min — AB (ref >90)
Glucose, Bld: 112 mg/dL — ABNORMAL HIGH (ref 70–99)
POTASSIUM: 4.1 mmol/L (ref 3.5–5.1)
SODIUM: 136 mmol/L (ref 135–145)

## 2014-09-20 LAB — CBC
HEMATOCRIT: 33.7 % — AB (ref 36.0–46.0)
Hemoglobin: 11.2 g/dL — ABNORMAL LOW (ref 12.0–15.0)
MCH: 29.6 pg (ref 26.0–34.0)
MCHC: 33.2 g/dL (ref 30.0–36.0)
MCV: 89.2 fL (ref 78.0–100.0)
Platelets: 192 10*3/uL (ref 150–400)
RBC: 3.78 MIL/uL — AB (ref 3.87–5.11)
RDW: 14.6 % (ref 11.5–15.5)
WBC: 5.6 10*3/uL (ref 4.0–10.5)

## 2014-09-20 LAB — GLUCOSE, CAPILLARY
GLUCOSE-CAPILLARY: 104 mg/dL — AB (ref 70–99)
GLUCOSE-CAPILLARY: 122 mg/dL — AB (ref 70–99)
Glucose-Capillary: 112 mg/dL — ABNORMAL HIGH (ref 70–99)

## 2014-09-20 MED ORDER — AMLODIPINE BESYLATE 2.5 MG PO TABS
2.5000 mg | ORAL_TABLET | Freq: Every day | ORAL | Status: DC
  Administered 2014-09-20: 2.5 mg via ORAL
  Filled 2014-09-20 (×2): qty 1

## 2014-09-20 NOTE — Progress Notes (Signed)
Family Medicine Teaching Service Daily Progress Note Intern Pager: [phone]  Patient name: Alicia [name] Medical record number: [id number] Date of birth: 09-Jan-1923 Age: 81 y.o. Gender: female  Primary Care Provider: Shellia Carwin, PA-C Consultants: None Code Status: DNR (per family)  Assessment and Plan: 81 y.o. female presenting with report of coffee ground emesis. PMH is significant for GERD, depression/anxiety, dementia, HTN, HLD, ovarian cancer, hx osteoporosis, CKD stage IV, and SBO in Oct 2015.  # Coffee ground emesis:  - CT abd-  Similar in appearance to previous CT scan in Oct 2015  - dilated distal jejunum and proximal and mid-ileum, close loop obstruction vs. Very long hypertonic segment of bowel - KUB- no evidence of a small bowel obstruction - Swallow Study- Advance diet as tolerated, no follow up required - Advance diet as tolerated - PT/OT- recommend SNF - Currently lives with family. Care management following. DC plan home with home health services (RN, PT, OT).  # Anemia-  Hemoglobin 16 at admission - Hemoglobin 11.2 today, decreased from 12.1 yesterday  - Has been receiving IV fluids due to previous NPO status. Suspect dilution may be contributing. - Discontinued fluids  # HTN:  - BP 150/53 this am - Restarting home Amlodipine - prn hydralazine for elevated BP  FEN/GI: Carb Modified Prophylaxis: SQ heparin   Disposition: Home pending ability to tolerate diet without emesis.  Subjective:  No overnight events. Denies pain. Has not eaten today. Nursing contacted and states no episodes of vomiting today. No further complaints.   Objective: Temp:  [97.7 F (36.5 C)-99 F (37.2 C)] 97.7 F (36.5 C) (01/09 0516) Pulse Rate:  [73-74] 73 (01/09 0653) Resp:  [17-18] 17 (01/09 0653) BP: (149-163)/(52-90) 150/53 mmHg (01/09 0653) SpO2:  [92 %-96 %] 96 % (01/09 7253) Physical Exam: General: Demented 81yo female resting comfortably in no apparent  distress Cardiovascular: S1 and S2 noted. No murmurs/rubs/gallops. Respiratory: Clear to auscultation bilaterally. No wheeze/rales/rhonchi. No increased work of breathing. Abdomen: Bowel sounds noted. Soft and nondistended. No tenderness to palpation. Extremities: No edema noted. Neuro: Follows commands, moves all extremities spontaneously.  Laboratory: Results for orders placed or performed during the hospital encounter of 09/18/14 (from the past 24 hour(s))  Glucose, capillary     Status: None   Collection Time: 09/19/14  5:23 PM  Result Value Ref Range   Glucose-Capillary 90 70 - 99 mg/dL  Glucose, capillary     Status: Abnormal   Collection Time: 09/19/14  9:33 PM  Result Value Ref Range   Glucose-Capillary 116 (H) 70 - 99 mg/dL  CBC     Status: Abnormal   Collection Time: 09/20/14  4:59 AM  Result Value Ref Range   WBC 5.6 4.0 - 10.5 K/uL   RBC 3.78 (L) 3.87 - 5.11 MIL/uL   Hemoglobin 11.2 (L) 12.0 - 15.0 g/dL   HCT 33.7 (L) 36.0 - 46.0 %   MCV 89.2 78.0 - 100.0 fL   MCH 29.6 26.0 - 34.0 pg   MCHC 33.2 30.0 - 36.0 g/dL   RDW 14.6 11.5 - 15.5 %   Platelets 192 150 - 400 K/uL  Basic metabolic panel     Status: Abnormal   Collection Time: 09/20/14  4:59 AM  Result Value Ref Range   Sodium 136 135 - 145 mmol/L   Potassium 4.1 3.5 - 5.1 mmol/L   Chloride 103 96 - 112 mEq/L   CO2 24 19 - 32 mmol/L   Glucose, Bld 112 (H) 70 -  99 mg/dL   BUN 20 6 - 23 mg/dL   Creatinine, Ser 1.34 (H) 0.50 - 1.10 mg/dL   Calcium 8.5 8.4 - 10.5 mg/dL   GFR calc non Af Amer 34 (L) >90 mL/min   GFR calc Af Amer 39 (L) >90 mL/min   Anion gap 9 5 - 15  Glucose, capillary     Status: Abnormal   Collection Time: 09/20/14  8:29 AM  Result Value Ref Range   Glucose-Capillary 104 (H) 70 - 99 mg/dL    Imaging/Diagnostic Tests: Ct Abdomen Pelvis Wo Contrast 09/18/2014  IMPRESSION: 1. Dilated distal jejunum and proximal and mid ileum, with normal caliber proximal jejunum and distal ileum. A closed  loop obstruction can have this appearance. However, it is possible though this just represents a very long hypertonic segment of bowel, as the pattern is similar to that seen on the CTs in October, 2015. 2. No free intraperitoneal air. 3. Stable moderately large hiatal hernia. 4. Diffuse colonic diverticulosis without evidence of acute diverticulitis. 5. Improved right hydronephrosis since the prior CTs. 6. Stable mild left renal atrophy.     Dg Abd Acute W/chest 09/18/2014 IMPRESSION: Negative abdominal radiographs. No plain film evidence of small bowel obstruction. No acute cardiopulmonary disease.     [place], [place] 09/20/2014, 10:08 AM PGY-1, [place] Intern pager: [phone], text pages welcome

## 2014-09-20 NOTE — Progress Notes (Signed)
Patient discharge and discharge instructions reviewed with [name] and Carrington Clamp.  All questions addressed.  No issues or concerns at discharge.

## 2014-09-20 NOTE — Discharge Instructions (Signed)
Small Bowel Obstruction A small bowel obstruction is a blockage (obstruction) of the small intestine (small bowel). The small bowel is a long, slender tube that connects the stomach to the colon. Its job is to absorb nutrients from the fluids and foods you consume into the bloodstream.  CAUSES  There are many causes of intestinal blockage. The most common ones include:  Hernias. This is a more common cause in children than adults.  Inflammatory bowel disease (enteritis and colitis).  Twisting of the bowel (volvulus).  Tumors.  Scar tissue (adhesions) from previous surgery or radiation treatment.  Recent surgery. This may cause an acute small bowel obstruction called an ileus. SYMPTOMS   Abdominal pain. This may be dull cramps or sharp pain. It may occur in one area or may be present in the entire abdomen. Pain can range from mild to severe, depending on the degree of obstruction.  Nausea and vomiting. Vomit may be greenish or yellow bile color.  Distended or swollen stomach. Abdominal bloating is a common symptom.  Constipation.  Lack of passing gas.  Frequent belching.  Diarrhea. This may occur if runny stool is able to leak around the obstruction. DIAGNOSIS  Your caregiver can usually diagnose small bowel obstruction by taking a history, doing a physical exam, and taking X-rays. If the cause is unclear, a CT scan (computerized tomography) of your abdomen and pelvis may be needed. TREATMENT  Treatment of the blockage depends on the cause and how bad the problem is.   Sometimes, the obstruction improves with bed rest and intravenous (IV) fluids.  Resting the bowel is very important. This means following a simple diet. Sometimes, a clear liquid diet may be required for several days.  Sometimes, a small tube (nasogastric tube) is placed into the stomach to decompress the bowel. When the bowel is blocked, it usually swells up like a balloon filled with air and fluids.  Decompression means that the air and fluids are removed by suction through that tube. This can help with pain, discomfort, and nausea. It can also help the obstruction resolve faster.  Surgery may be required if other treatments do not work. Bowel obstruction from a hernia may require early surgery and can be an emergency procedure. Adhesions that cause frequent or severe obstructions may also require surgery. HOME CARE INSTRUCTIONS If your bowel obstruction is only partial or incomplete, you may be allowed to go home.  Get plenty of rest.  Follow your diet as directed by your caregiver. SEEK IMMEDIATE MEDICAL CARE IF:  You have increased pain or cramping.  You vomit blood.  You have uncontrolled vomiting or nausea.  You cannot drink fluids due to vomiting or pain.  You develop confusion.  You begin feeling very dry or thirsty (dehydrated).  You have severe bloating.  You have chills.  You have a fever.  You feel extremely weak or you faint. MAKE SURE YOU:  Understand these instructions.  Will watch your condition.  Will get help right away if you are not doing well or get worse. Document Released: 11/15/2005 Document Revised: 11/21/2011 Document Reviewed: 11/12/2010 [facility] Patient Information 2015 [place], [place]. This information is not intended to replace advice given to you by your health care provider. Make sure you discuss any questions you have with your health care provider.

## 2014-09-21 NOTE — Discharge Summary (Signed)
[facility] Discharge Summary  Patient name: Alicia [name] Medical record number: [id number] Date of birth: 12-11-1922 Age: 81 y.o. Gender: female Date of Admission: 09/18/2014  Date of Discharge: 09/20/2014 Admitting Physician: Lind Covert, MD  Primary Care Provider: Shellia Carwin, PA-C Consultants: None  Indication for Hospitalization: Possible paritial SBO  Discharge Diagnoses/Problem List:   Patient Active Problem List   Diagnosis Date Noted  . Abdominal pain   . Hematemesis with nausea   . SBO (small bowel obstruction) 09/18/2014  . Partial small bowel obstruction 06/13/2014  . Sliding hiatal hernia 06/13/2014  . Diabetes mellitus without complication   . Nausea 06/12/2014  . Paroxysmal supraventricular tachycardia 06/12/2014  . History of syncope 06/12/2014  . GERD (gastroesophageal reflux disease) 06/12/2014  . Paralysis agitans 01/28/2014  . Urinary tract infection 01/12/2014  . Dementia 01/09/2014  . HCAP (healthcare-associated pneumonia) 12/19/2013  . Pneumonia 12/19/2013  . Nausea & vomiting 12/19/2013  . HLD (hyperlipidemia) 12/19/2013  . Cancer of ovary 11/30/2013  . OP (osteoporosis) 11/30/2013  . Chronic kidney disease (CKD), stage IV (severe) 11/30/2013  . Memory loss 12/10/2012  . Depression 12/10/2012  . Anxiety state, unspecified 12/10/2012  . Syncope 12/10/2012  . Spells 12/10/2012  . Essential hypertension, benign 12/10/2012  . Other and unspecified hyperlipidemia 12/10/2012  . Loss of weight 12/10/2012    Disposition: Home with [facility] services  Discharge Condition: Improved  Discharge Exam:  Filed Vitals:   09/20/14 1458  BP: 124/46  Pulse:   Temp: 97.9 F (36.6 C)  Resp: 16   General: Demented 81yo female resting comfortably in no apparent distress Cardiovascular: S1 and S2 noted. No murmurs/rubs/gallops. Respiratory: Clear to auscultation bilaterally. No wheeze/rales/rhonchi. No increased work of  breathing. Abdomen: Bowel sounds noted. Soft and nondistended. No tenderness to palpation. Extremities: No edema noted. Neuro: Follows commands, moves all extremities spontaneously. Oriented to self only.  Brief Hospital Course:  Alicia [name] is a 81 y.o. female who presented with report of coffee ground emesis. PMH is significant for GERD, depression/anxiety, dementia, HTN, HLD, ovarian cancer, hx osteoporosis, CKD stage IV, and SBO in Oct 2015.  # Coffee ground emesis: Concern for possible small bowel obstruction. Patient with history of partial SBO 10/2 - 06/17/14 hospital admission on IMTS and had spontaneous resolution with supportive care.Also history of abdominal and pelvic surgeries for ovarian cancer.Patient had no significant distention or tenderness on exam. CT abd showing evidence of possible SBO, although similar in appearance to imaging in Oct so unclear if this was a chronic finding. KUB did not show evidence of a small bowel obstruction. We observed patient and treated with supportive care during this hospitalization. We advanced her diet as tolerated. She was able to eat without further emesis episodes and also had BM this admission.  # Anemia: Due to concern for hematemesis and anemia on labs we monitored her Hbg. Her Hbg remained stable.   Patient was evaluated by PT/OT this admission and SNF was recommended. However, patient lives at home with son and his wife. She is able to received 24/hr supervision at home so [facility] PT/OT and RN were ordered.   The patient's other chronic conditions, including dementia, HTN,  HLD, osteoporosis, depression, and CKD were stable during this hospitalization and the patient was continued on her home medications.   Issues for Follow Up:  1. HH PT/OT 2. Recheck Hbg  Significant Procedures: None  Significant Labs and Imaging:   Recent Labs Lab 09/18/14  1530 09/19/14 0545 09/20/14 0459  WBC 13.0* 6.3 5.6  HGB 14.4 12.1 11.2*  HCT  43.6 36.4 33.7*  PLT 201 218 192    Recent Labs Lab 09/18/14 1513 09/18/14 1530 09/19/14 0545 09/20/14 0459  NA 137 137 135 136  K 4.6 4.6 4.1 4.1  CL 102 105 104 103  CO2  --  25 24 24   GLUCOSE 142* 136* 120* 112*  BUN 36* 28* 23 20  CREATININE 1.20* 1.30* 1.35* 1.34*  CALCIUM  --  9.2 8.3* 8.5  ALKPHOS  --  104  --   --   AST  --  25  --   --   ALT  --  16  --   --   ALBUMIN  --  3.4*  --   --    Ct Abdomen Pelvis Wo Contrast 09/18/2014 IMPRESSION: 1. Dilated distal jejunum and proximal and mid ileum, with normal caliber proximal jejunum and distal ileum. A closed loop obstruction can have this appearance. However, it is possible though this just represents a very long hypertonic segment of bowel, as the pattern is similar to that seen on the CTs in October, 2015. 2. No free intraperitoneal air. 3. Stable moderately large hiatal hernia. 4. Diffuse colonic diverticulosis without evidence of acute diverticulitis. 5. Improved right hydronephrosis since the prior CTs. 6. Stable mild left renal atrophy.   Dg Abd Acute W/chest 09/18/2014 IMPRESSION: Negative abdominal radiographs. No plain film evidence of small bowel obstruction. No acute cardiopulmonary disease.    Results/Tests Pending at Time of Discharge: None  Discharge Medications:    Medication List    STOP taking these medications        doxycycline 100 MG capsule  Commonly known as:  VIBRAMYCIN     promethazine 12.5 MG tablet  Commonly known as:  PHENERGAN     sodium phosphate 7-19 GM/118ML Enem      TAKE these medications        amLODipine 2.5 MG tablet  Commonly known as:  NORVASC  Take 2.5 mg by mouth daily.     aspirin EC 81 MG tablet  Take 81 mg by mouth daily.     atorvastatin 20 MG tablet  Commonly known as:  LIPITOR  Take 20 mg by mouth at bedtime.     calcium carbonate 600 MG Tabs tablet  Commonly known as:  OS-CAL  Take 600 mg by mouth at bedtime.     docusate sodium 100 MG capsule   Commonly known as:  COLACE  Take 100 mg by mouth 2 (two) times daily.     loratadine 10 MG tablet  Commonly known as:  CLARITIN  Take 10 mg by mouth daily.     memantine 28 MG Cp24 24 hr capsule  Commonly known as:  NAMENDA XR  Take 28 mg by mouth daily.     multivitamin with minerals Tabs tablet  Take 1 tablet by mouth daily.     PROAIR RESPICLICK 476 (90 BASE) MCG/ACT Aepb  Generic drug:  Albuterol Sulfate  Inhale 2 puffs into the lungs every 4 (four) hours as needed (for shortness of breath).     PROBIOTIC PO  Take 1 tablet by mouth 2 (two) times daily.     sertraline 50 MG tablet  Commonly known as:  ZOLOFT  Take 50 mg by mouth daily.        Discharge Instructions: Please refer to Patient Instructions section of EMR for full details.  Patient  was counseled important signs and symptoms that should prompt return to medical care, changes in medications, dietary instructions, activity restrictions, and follow up appointments.   Follow-Up Appointments:     Follow-up Information    Follow up with [facility].   Why:  hhpt, hhrn, hhot   Contact information:   [address] [place] [address] [phone]       Follow up with [name] P, PA-C In 1 week.   Specialty:  Physician Assistant   Contact information:   [place] [address] [phone]       Katheren Shams, DO 09/21/2014, 7:31 AM PGY-1, [place]

## 2014-10-01 ENCOUNTER — Ambulatory Visit: Payer: Medicare Other | Admitting: Neurology

## 2014-10-01 ENCOUNTER — Telehealth: Payer: Self-pay | Admitting: Neurology

## 2014-10-01 NOTE — Telephone Encounter (Signed)
Patient is a no show for today's appointment 10/01/14

## 2014-10-30 ENCOUNTER — Encounter (HOSPITAL_COMMUNITY): Payer: Self-pay | Admitting: Emergency Medicine

## 2014-10-30 ENCOUNTER — Inpatient Hospital Stay (HOSPITAL_COMMUNITY)
Admission: EM | Admit: 2014-10-30 | Discharge: 2014-11-04 | DRG: 392 | Disposition: A | Discharge status: Home Health | Payer: Medicare Other | Attending: Internal Medicine | Admitting: Internal Medicine

## 2014-10-30 ENCOUNTER — Ambulatory Visit: Payer: Medicare Other | Admitting: Neurology

## 2014-10-30 ENCOUNTER — Emergency Department (HOSPITAL_COMMUNITY): Payer: Medicare Other

## 2014-10-30 ENCOUNTER — Ambulatory Visit (INDEPENDENT_AMBULATORY_CARE_PROVIDER_SITE_OTHER): Payer: Medicare Other | Admitting: Neurology

## 2014-10-30 ENCOUNTER — Encounter: Payer: Self-pay | Admitting: Neurology

## 2014-10-30 VITALS — BP 128/72 | HR 77 | Temp 97.1°F | Ht 62.0 in | Wt 137.0 lb

## 2014-10-30 DIAGNOSIS — Z8543 Personal history of malignant neoplasm of ovary: Secondary | ICD-10-CM

## 2014-10-30 DIAGNOSIS — T17320A Food in larynx causing asphyxiation, initial encounter: Secondary | ICD-10-CM

## 2014-10-30 DIAGNOSIS — Z823 Family history of stroke: Secondary | ICD-10-CM

## 2014-10-30 DIAGNOSIS — Z91041 Radiographic dye allergy status: Secondary | ICD-10-CM

## 2014-10-30 DIAGNOSIS — F0390 Unspecified dementia without behavioral disturbance: Secondary | ICD-10-CM | POA: Diagnosis present

## 2014-10-30 DIAGNOSIS — I129 Hypertensive chronic kidney disease with stage 1 through stage 4 chronic kidney disease, or unspecified chronic kidney disease: Secondary | ICD-10-CM | POA: Diagnosis present

## 2014-10-30 DIAGNOSIS — E876 Hypokalemia: Secondary | ICD-10-CM | POA: Diagnosis not present

## 2014-10-30 DIAGNOSIS — N183 Chronic kidney disease, stage 3 unspecified: Secondary | ICD-10-CM

## 2014-10-30 DIAGNOSIS — Z90710 Acquired absence of both cervix and uterus: Secondary | ICD-10-CM

## 2014-10-30 DIAGNOSIS — K6389 Other specified diseases of intestine: Secondary | ICD-10-CM | POA: Insufficient documentation

## 2014-10-30 DIAGNOSIS — I10 Essential (primary) hypertension: Secondary | ICD-10-CM | POA: Diagnosis present

## 2014-10-30 DIAGNOSIS — K219 Gastro-esophageal reflux disease without esophagitis: Secondary | ICD-10-CM | POA: Diagnosis present

## 2014-10-30 DIAGNOSIS — G20 Parkinson's disease: Secondary | ICD-10-CM | POA: Diagnosis present

## 2014-10-30 DIAGNOSIS — I471 Supraventricular tachycardia, unspecified: Secondary | ICD-10-CM | POA: Diagnosis present

## 2014-10-30 DIAGNOSIS — R1110 Vomiting, unspecified: Secondary | ICD-10-CM | POA: Diagnosis present

## 2014-10-30 DIAGNOSIS — M810 Age-related osteoporosis without current pathological fracture: Secondary | ICD-10-CM | POA: Diagnosis present

## 2014-10-30 DIAGNOSIS — E119 Type 2 diabetes mellitus without complications: Secondary | ICD-10-CM | POA: Diagnosis present

## 2014-10-30 DIAGNOSIS — K5790 Diverticulosis of intestine, part unspecified, without perforation or abscess without bleeding: Secondary | ICD-10-CM | POA: Diagnosis present

## 2014-10-30 DIAGNOSIS — Z79899 Other long term (current) drug therapy: Secondary | ICD-10-CM

## 2014-10-30 DIAGNOSIS — K56609 Unspecified intestinal obstruction, unspecified as to partial versus complete obstruction: Secondary | ICD-10-CM

## 2014-10-30 DIAGNOSIS — N390 Urinary tract infection, site not specified: Secondary | ICD-10-CM | POA: Diagnosis not present

## 2014-10-30 DIAGNOSIS — K440 Diaphragmatic hernia with obstruction, without gangrene: Secondary | ICD-10-CM | POA: Diagnosis not present

## 2014-10-30 DIAGNOSIS — F329 Major depressive disorder, single episode, unspecified: Secondary | ICD-10-CM | POA: Diagnosis present

## 2014-10-30 DIAGNOSIS — R109 Unspecified abdominal pain: Secondary | ICD-10-CM

## 2014-10-30 DIAGNOSIS — Z881 Allergy status to other antibiotic agents status: Secondary | ICD-10-CM

## 2014-10-30 DIAGNOSIS — E785 Hyperlipidemia, unspecified: Secondary | ICD-10-CM | POA: Diagnosis present

## 2014-10-30 DIAGNOSIS — F32A Depression, unspecified: Secondary | ICD-10-CM | POA: Diagnosis present

## 2014-10-30 DIAGNOSIS — Z7982 Long term (current) use of aspirin: Secondary | ICD-10-CM

## 2014-10-30 DIAGNOSIS — R112 Nausea with vomiting, unspecified: Secondary | ICD-10-CM | POA: Diagnosis present

## 2014-10-30 DIAGNOSIS — Z803 Family history of malignant neoplasm of breast: Secondary | ICD-10-CM

## 2014-10-30 DIAGNOSIS — F411 Generalized anxiety disorder: Secondary | ICD-10-CM | POA: Diagnosis present

## 2014-10-30 DIAGNOSIS — Z888 Allergy status to other drugs, medicaments and biological substances status: Secondary | ICD-10-CM

## 2014-10-30 DIAGNOSIS — K56600 Partial intestinal obstruction, unspecified as to cause: Secondary | ICD-10-CM

## 2014-10-30 LAB — BASIC METABOLIC PANEL
Anion gap: 9 (ref 5–15)
BUN: 21 mg/dL (ref 6–23)
CO2: 23 mmol/L (ref 19–32)
Calcium: 9.2 mg/dL (ref 8.4–10.5)
Chloride: 103 mmol/L (ref 96–112)
Creatinine, Ser: 1.32 mg/dL — ABNORMAL HIGH (ref 0.50–1.10)
GFR calc Af Amer: 40 mL/min — ABNORMAL LOW (ref >90)
GFR calc non Af Amer: 35 mL/min — ABNORMAL LOW (ref >90)
GLUCOSE: 181 mg/dL — AB (ref 70–99)
POTASSIUM: 4.2 mmol/L (ref 3.5–5.1)
SODIUM: 135 mmol/L (ref 135–145)

## 2014-10-30 LAB — CBC
HCT: 39.3 % (ref 36.0–46.0)
HEMOGLOBIN: 12.9 g/dL (ref 12.0–15.0)
MCH: 28.8 pg (ref 26.0–34.0)
MCHC: 32.8 g/dL (ref 30.0–36.0)
MCV: 87.7 fL (ref 78.0–100.0)
Platelets: 248 10*3/uL (ref 150–400)
RBC: 4.48 MIL/uL (ref 3.87–5.11)
RDW: 14.9 % (ref 11.5–15.5)
WBC: 12.6 10*3/uL — ABNORMAL HIGH (ref 4.0–10.5)

## 2014-10-30 LAB — I-STAT TROPONIN, ED: Troponin i, poc: 0.01 ng/mL (ref 0.00–0.08)

## 2014-10-30 LAB — BRAIN NATRIURETIC PEPTIDE: B Natriuretic Peptide: 94 pg/mL (ref 0.0–100.0)

## 2014-10-30 MED ORDER — MEMANTINE HCL ER 28 MG PO CP24: 28.0000 mg | ORAL_CAPSULE | Freq: Every day | ORAL | Status: DC

## 2014-10-30 MED ORDER — MEMANTINE HCL ER 21 MG PO CP24: 21.0000 mg | ORAL_CAPSULE | Freq: Every day | ORAL | Status: DC

## 2014-10-30 MED ORDER — MEMANTINE HCL ER 14 MG PO CP24: 14.0000 mg | ORAL_CAPSULE | Freq: Every day | ORAL | Status: DC

## 2014-10-30 NOTE — ED Notes (Signed)
Updated daughter [name] r/t patient's condition.

## 2014-10-30 NOTE — Progress Notes (Signed)
Subjective:    Patient ID: Alicia [name] is a 81 y.o. female.  HPI     Interim history:   Alicia [name] is a very pleasant 81 year old right-handed woman with an underlying medical history of cancer, tremor, depression, anxiety, hyperlipidemia, syncope, weight loss and memory loss, who presents for followup consultation of her memory loss and intermittent confusion. She is accompanied by her daughter-in-law, Alicia [name], again today. She no showed for an appointment on 10/01/2014. I last saw her on 04/02/2014, at which time Alicia [name] reported a significant decline in her cognitive function. She had urinary incontinence. She was sleeping more and eating a lot more. She was gaining weight. She had titrated up to Namenda long-acting 28 mg once daily. In the past was not able to tolerate Aricept. In the interim, she has had multiple emergency room visits and hospitalizations. I reviewed her records in the [facility]. On 05/07/2014 she was taken to the emergency room for weakness. No cause was found. On 05/28/2014 she was taken to the emergency room for abdominal pain. On 06/11/2014 she was hospitalized till 06/12/2014 for chest pain. She was hospitalized from 06/13/2014 through 06/17/2014 for small bowel obstruction. She went back to the emergency room for nausea and vomiting on 06/19/2014 but did not have recurrence of small bowel obstruction. She was in the emergency room on 08/10/2014 for a syncopal spell and was diagnosed and treated for urinary tract infection. She went to the emergency room at 09/06/2014 for altered mental status and also on 09/10/2014 for altered mental status. She was admitted to the hospital on 09/18/2014 and discharged on 09/20/2014 for small bowel obstruction.   Today, Alicia [name] reports, that she had a pre-syncopal spell while watching a ball game. She never completely lost consciousnes. She had a fecal impaction, which was treated with manual disimpaction and soap suds enema. Since  then, she actually has done better. Alicia [name] says that she is eating well. She has a daily bowel movement which is sometimes looser but at least she is not having any constipation and smaller bowel movements concerning for recurrence of her bowel impaction. She is drinking plenty of fluid and has a good appetite. Overall, Alicia [name] believes she has turned the curve with respect to her GI problems. Nevertheless, her memory continues to decline slowly. Unfortunately, she ran out of her Namenda long-acting about 6 or 7 days ago. She has seen a GI doctor affiliated with [facility]. She has been diagnosed with diverticulosis.  I saw her on 11/11/2013, at which time we talked about her confusion and cognitive decline she has been off of Aricept we tried her on Namenda XR, starting at 7 mg daily and I provided her with a starter packet trial card. In the interim, the patient was admitted to the hospital twice. In April 2015 she was admitted with nausea, vomiting and fever. On 01/12/2014 she was taken to the emergency room with altered mental status. She was not admitted to the hospital. On 01/21/2014 she was admitted to the hospital after syncopal spell. She was discharged to skilled nursing facility and was discharged from there in June and is now back to home with son, daughter in law and their 2 boys. They had a tough transition back home, but she seems stable now. She has been able to go up to Namenda XR 28 mg daily, gradually since June and while there is no significant cognitive improvement, she seems to be tolerating it. She saw the urologist this morning,  and is now again on cipro. She had a CTH wo contrast and a C spine CT on 01/12/14: No evidence of acute intracranial or acute cervical spine   abnormality. Atrophy and chronic small-vessel white matter ischemic changes. Moderate to severe degenerative disc disease from C3-C6. She had a brain MRI wo contrast on 12/25/13: Atrophy and small vessel disease. No acute  intracranial findings.   I saw her on 06/21/2013, at which time we lowered the Aricept again to 5 mg, due to concern for bradycardia. I also suggested we consider Namenda XR down the road. At the time, her daughter in-law reported no recent issues with confusion or pre-syncope. She had low BP one night and she had low K and she was not given her Aricept and her lisinopril. She had also been sleep walking or had some confusion: She left the house one day in the early morning and they had to call 911 and she was found walking on a neighboring street and sat down in a neighbor's garage. She has needed more prompting with simple tasks. They had an aid daily from 11 AM to 2 PM, but the aid quit on the day of her appointment in 10/14.   In the interim, her daughter called on 07/11/2013 reporting a syncopal spell at home. We had her stop the Aricept. In November 2014 she had an implantable loop monitor placed under Dr. Rayann Heman, with monitoring through Dr. Wynonia Lawman. She was seen in followup by Dr. Rayann Heman on 10/24/2013. She was started on low-dose amlodipine.    I saw her on 03/12/2013, at which time I increased her Aricept from 5 mg to 10 mg again. I first met her on 12/10/2012 and she previously followed with Dr. Morene Antu. MRI brain in June 2011 showed chronic microvascular ischemia and generalized atrophy, RPR and B12 were normal in June 2011. She was started on Aricept but discontinued the medication and then re-started it. She was admitted to [facility] in 3/14 for an episode of unresponsiveness. At the time of her first visit I suggested an EEG as well as cardiology evaluation. I did not make any changes in her medications. Her EEG was normal in the awake state. She had a 30 day Holter, which was normal. She saw Dr. Tollie Eth. He lowered her metoprolol to 25 mg, and she had discontinued her amlodipine per PCP previously. Her Aricept had been decreased in the past d/t concern of bradycardia.    The patient has had some AH and rare VH. They seem to be transformational. She has been sleeping fairly well, but at times she wanders at night but is usually easily redirected.    Her Past Medical History Is Significant For: Past Medical History  Diagnosis Date  . Dementia     significant  . Renal disease   . Memory loss 12/10/2012  . Depression 12/10/2012  . Anxiety state, unspecified 12/10/2012  . Spells 12/10/2012  . Essential hypertension, benign 12/10/2012  . Other and unspecified hyperlipidemia 12/10/2012  . Loss of weight 12/10/2012  . Carotid artery stenosis   . Ovarian cancer     treated with surgery & chemo  . Ankle fracture, right   . Unstable gait     mild  . Syncope 12/10/2012    Recurrent, sporadic episodes  . Carotid artery disease   . Parkinson disease   . Diabetes mellitus without complication   . Diverticulosis     Her Past Surgical History Is Significant For:  Past Surgical History  Procedure Laterality Date  . Vesicovaginal fistula closure w/ tah  2005  . Oophorectomy  2007    for ovarian cancer  . Prolapsed uterine fibroid ligation    . Appendectomy    . Cataract extraction Bilateral   . Implantable loop recorder  11/14    MDT LINQ implanted for recurrent unexplained syncope by Dr Rayann Heman  . Abdominal hysterectomy    . Ankle surgery Left   . Loop recorder implant N/A 07/17/2013    Procedure: LOOP RECORDER IMPLANT;  Surgeon: Coralyn Mark, MD;  Location: [facility];  Service: Cardiovascular;  Laterality: N/A;    Her Family History Is Significant For: Family History  Problem Relation Age of Onset  . Cancer Mother     breast  . Stroke Father     Her Social History Is Significant For: History   Social History  . Marital Status: Married    Spouse Name: N/A  . Number of Children: N/A  . Years of Education: N/A   Social History Main Topics  . Smoking status: Never Smoker   . Smokeless tobacco: Never Used  . Alcohol Use: No  . Drug Use: No   . Sexual Activity: No   Other Topics Concern  . None   Social History Narrative   Lives with son and daughter in law    Her Allergies Are:  Allergies  Allergen Reactions  . Iohexol Rash  . Ativan [Lorazepam] Other (See Comments)    Other reaction(s): Other (See Comments) MAKES HER AGITATED MAKES HER AGITATED  . Ciprofloxacin Other (See Comments)    weakness  . Keflex [Cephalexin] Other (See Comments)    Other reaction(s): Other (See Comments) weakness weakness  . Nsaids Other (See Comments)    Renal insufficiency  . Sulfa Antibiotics Hives and Itching  . Ivp Dye [Iodinated Diagnostic Agents] Rash  :   Her Current Medications Are:  Outpatient Encounter Prescriptions as of 10/30/2014  Medication Sig  . Albuterol Sulfate (PROAIR RESPICLICK) 627 (90 BASE) MCG/ACT AEPB Inhale 2 puffs into the lungs every 4 (four) hours as needed (for shortness of breath).   Marland Kitchen amLODipine (NORVASC) 2.5 MG tablet Take 2.5 mg by mouth daily.  Marland Kitchen aspirin EC 81 MG tablet Take 81 mg by mouth daily.  Marland Kitchen atorvastatin (LIPITOR) 20 MG tablet Take 20 mg by mouth at bedtime.   . calcium carbonate (OS-CAL) 600 MG TABS tablet Take 600 mg by mouth at bedtime.  . docusate sodium (COLACE) 100 MG capsule Take 100 mg by mouth 2 (two) times daily.  Marland Kitchen loratadine (CLARITIN) 10 MG tablet Take 10 mg by mouth daily.   . Memantine HCl ER (NAMENDA XR) 28 MG CP24 Take 28 mg by mouth daily.  . Multiple Vitamin (MULTIVITAMIN WITH MINERALS) TABS Take 1 tablet by mouth daily.  . Probiotic Product (PROBIOTIC PO) Take 1 tablet by mouth 2 (two) times daily.  . sertraline (ZOLOFT) 50 MG tablet Take 50 mg by mouth daily.  :  Review of Systems:  Out of a complete 14 point review of systems, all are reviewed and negative with the exception of these symptoms as listed below:   Review of Systems  Constitutional: Positive for activity change and appetite change.  HENT: Positive for drooling.   Respiratory: Positive for cough,  shortness of breath and wheezing.     Objective:  Neurologic Exam  Physical Exam Physical Examination:   Filed Vitals:   10/30/14 1131  BP: 128/72  Pulse: 77  Temp: 97.1 F (36.2 C)    General Examination: The patient is a very pleasant 81 y.o. female in no acute distress. She is calm and cooperative with the exam. She denies Auditory Hallucinations and Visual Hallucinations.   HEENT: Normocephalic, atraumatic, pupils are equal, round and reactive to light and accommodation. Extraocular tracking shows moderate saccadic breakdown without nystagmus noted. Hearing is impaired severely. Face is symmetric with mild facial masking and normal facial sensation. There is a mild intermittent lip, and lower jaw tremor. Neck is supple rigid with intact passive ROM. There are no carotid bruits on auscultation. Oropharynx exam reveals no mouth dryness. No significant airway crowding is noted. Mallampati is class II. Tongue protrudes centrally and palate elevates symmetrically. She has mild to moderate drooling. This seems to be worse from before.   Chest: is clear to auscultation without wheezing, rhonchi or crackles noted.  Heart: sounds are regular and normal without murmurs, rubs or gallops noted.   Abdomen: is soft, non-tender and non-distended with normal bowel sounds appreciated on auscultation.  Extremities: There is no pitting edema in the distal lower extremities bilaterally. Pedal pulses are intact.  Skin: is warm and dry with no trophic changes noted.  Musculoskeletal: exam reveals no obvious joint deformities, tenderness or joint swelling or erythema.  Neurologically:  Mental status: The patient is awake and alert, paying very little attention. She is unable to provide the history. She is oriented to: person, place, day of week, not month of year or year. Her memory, attention, language and knowledge are impaired. There is no aphasia, agnosia, apraxia or anomia. There is a mild degree  of bradyphrenia. Speech is mild to moderately hypophonic with no dysarthria noted. Mood is congruent and affect is normal.    On 11/11/13: MMSE: did not do, as she has recently been ill and was not back to her baseline.    04/02/2014: MMSE: 18/30, CDT was 3/4, AFT was 3/min.  On 10/30/2014: MMSE: 13/30, CDT 3/4, AFT 3/min.  Cranial nerves are as described above under HEENT exam. In addition, shoulder shrug is normal with equal shoulder height noted.  Motor exam: Normal bulk, and strength for age is noted. Tone is not rigid with absence of cogwheeling in the extremities. There is overall mild bradykinesia. There is no drift or rebound. Romberg is negative. Reflexes are 2+ in the upper extremities and 1+ in the lower extremities. Fine motor skills: Finger taps, hand movements, and rapid alternating patting are mildly impaired bilaterally. Foot taps and foot agility are mildly impaired bilaterally. She has an intermittent hand tremor, resting, in the right upper extremity.   Cerebellar testing shows no dysmetria or intention tremor on finger to nose testing. There is no truncal or gait ataxia.   Sensory exam is intact to light touch.    Gait, station and balance: She stands up from the seated position with mild difficulty and needs to push herself up. No veering to one side is noted. No leaning to one side. Posture is mildly stooped. Stance is wide-based. She turns in 3 steps. Tandem walk is not possible. Balance is mildly impaired.   Assessment and Plan:    In summary, ANGINETTE ESPEJO is a very pleasant 81 year old female with a history of advanced dementia without behavioral disturbance, complicated by her advanced age, history of syncopal and pre-syncopal spells, low BP and low heart rate, (implantable loop recorder in place and followed by cardiology), previous report of VH and AH and  sleep walking, recurrent urinary tract infections as well as small bowel obstructions and recent  hospitalizations. She may have a mild degree of parkinsonism which actually seems a little better today. I would not recommend trying her on Parkinson's medication for fear of side effects. She has been tried on Aricept on more than one occasion and could not tolerate it. She is has been on Namenda long-acting 28 mg. Her MMSE score has decreased by 5 points in the last 6 or 7 months. She has had several hospitalizations with setbacks in her condition overall. She is somewhat stable at this time. She has been diagnosed with diverticulosis and had fecal impaction. We will restart Namenda today. We will start with 14 mg once daily for a week, then 21 mg once daily for a week, and then go back up to her 28 mg once daily thereafter. Thankfully, the patient has a great support system and wonderful family that helps take care of her. She has one on one care, as her family has hired sitters that help take care of her nursing home. I would like to see her back in about 6 months, sooner if the need arises and encouraged her daughter-in-law to call with any interim questions, concerns, problems or updates or refill requests.

## 2014-10-30 NOTE — ED Notes (Addendum)
Pt arrives with c/o chest pain post choking episode from home, per EMS she was eating a pulled pork sandwich and was able to cough it up. Emesis x1, 4 MG zofran given. Immediately after emesis, began c/o substernal chest pain. At triage, not c/o any pain. Alert to self only

## 2014-10-30 NOTE — ED Notes (Signed)
Pt passed PO challenge with water and applesauce.

## 2014-10-30 NOTE — Patient Instructions (Addendum)
You have to be mindful of her bowel movements. Use high fiber diet and plenty of water, about 6 glasses of water. Use prunes and prune juice. Any change in bowel habits can be concerning. Physical activity will help keep your bowels moving.  We will restart the Namenda XR with 14 mg daily for 7 days, then 21 mg once daily for 7 days, then back up to the 28 mg once daily thereafter.

## 2014-10-31 ENCOUNTER — Observation Stay (HOSPITAL_COMMUNITY): Payer: Medicare Other

## 2014-10-31 ENCOUNTER — Encounter (HOSPITAL_COMMUNITY): Payer: Self-pay

## 2014-10-31 DIAGNOSIS — F411 Generalized anxiety disorder: Secondary | ICD-10-CM | POA: Diagnosis present

## 2014-10-31 DIAGNOSIS — Z823 Family history of stroke: Secondary | ICD-10-CM | POA: Diagnosis not present

## 2014-10-31 DIAGNOSIS — I10 Essential (primary) hypertension: Secondary | ICD-10-CM

## 2014-10-31 DIAGNOSIS — Z91041 Radiographic dye allergy status: Secondary | ICD-10-CM | POA: Diagnosis not present

## 2014-10-31 DIAGNOSIS — E876 Hypokalemia: Secondary | ICD-10-CM | POA: Diagnosis not present

## 2014-10-31 DIAGNOSIS — Z90710 Acquired absence of both cervix and uterus: Secondary | ICD-10-CM | POA: Diagnosis not present

## 2014-10-31 DIAGNOSIS — Z79899 Other long term (current) drug therapy: Secondary | ICD-10-CM | POA: Diagnosis not present

## 2014-10-31 DIAGNOSIS — K219 Gastro-esophageal reflux disease without esophagitis: Secondary | ICD-10-CM | POA: Diagnosis present

## 2014-10-31 DIAGNOSIS — N183 Chronic kidney disease, stage 3 unspecified: Secondary | ICD-10-CM

## 2014-10-31 DIAGNOSIS — E785 Hyperlipidemia, unspecified: Secondary | ICD-10-CM | POA: Diagnosis present

## 2014-10-31 DIAGNOSIS — E119 Type 2 diabetes mellitus without complications: Secondary | ICD-10-CM | POA: Diagnosis present

## 2014-10-31 DIAGNOSIS — I129 Hypertensive chronic kidney disease with stage 1 through stage 4 chronic kidney disease, or unspecified chronic kidney disease: Secondary | ICD-10-CM | POA: Diagnosis present

## 2014-10-31 DIAGNOSIS — Z803 Family history of malignant neoplasm of breast: Secondary | ICD-10-CM | POA: Diagnosis not present

## 2014-10-31 DIAGNOSIS — Z888 Allergy status to other drugs, medicaments and biological substances status: Secondary | ICD-10-CM | POA: Diagnosis not present

## 2014-10-31 DIAGNOSIS — K440 Diaphragmatic hernia with obstruction, without gangrene: Secondary | ICD-10-CM | POA: Diagnosis present

## 2014-10-31 DIAGNOSIS — Z7982 Long term (current) use of aspirin: Secondary | ICD-10-CM | POA: Diagnosis not present

## 2014-10-31 DIAGNOSIS — F329 Major depressive disorder, single episode, unspecified: Secondary | ICD-10-CM | POA: Diagnosis present

## 2014-10-31 DIAGNOSIS — F0390 Unspecified dementia without behavioral disturbance: Secondary | ICD-10-CM | POA: Diagnosis present

## 2014-10-31 DIAGNOSIS — G20 Parkinson's disease: Secondary | ICD-10-CM | POA: Diagnosis present

## 2014-10-31 DIAGNOSIS — R1110 Vomiting, unspecified: Secondary | ICD-10-CM

## 2014-10-31 DIAGNOSIS — F0391 Unspecified dementia with behavioral disturbance: Secondary | ICD-10-CM

## 2014-10-31 DIAGNOSIS — Z8543 Personal history of malignant neoplasm of ovary: Secondary | ICD-10-CM | POA: Diagnosis not present

## 2014-10-31 DIAGNOSIS — M810 Age-related osteoporosis without current pathological fracture: Secondary | ICD-10-CM | POA: Diagnosis present

## 2014-10-31 DIAGNOSIS — K5790 Diverticulosis of intestine, part unspecified, without perforation or abscess without bleeding: Secondary | ICD-10-CM | POA: Diagnosis present

## 2014-10-31 DIAGNOSIS — Z881 Allergy status to other antibiotic agents status: Secondary | ICD-10-CM | POA: Diagnosis not present

## 2014-10-31 DIAGNOSIS — R1084 Generalized abdominal pain: Secondary | ICD-10-CM

## 2014-10-31 LAB — CBC
HEMATOCRIT: 41 % (ref 36.0–46.0)
HEMOGLOBIN: 13.6 g/dL (ref 12.0–15.0)
MCH: 29.2 pg (ref 26.0–34.0)
MCHC: 33.2 g/dL (ref 30.0–36.0)
MCV: 88 fL (ref 78.0–100.0)
Platelets: 241 10*3/uL (ref 150–400)
RBC: 4.66 MIL/uL (ref 3.87–5.11)
RDW: 15.1 % (ref 11.5–15.5)
WBC: 13.8 10*3/uL — ABNORMAL HIGH (ref 4.0–10.5)

## 2014-10-31 LAB — URINALYSIS, ROUTINE W REFLEX MICROSCOPIC
Bilirubin Urine: NEGATIVE
Glucose, UA: NEGATIVE mg/dL
HGB URINE DIPSTICK: NEGATIVE
KETONES UR: NEGATIVE mg/dL
Leukocytes, UA: NEGATIVE
Nitrite: NEGATIVE
Protein, ur: NEGATIVE mg/dL
SPECIFIC GRAVITY, URINE: 1.026 (ref 1.005–1.030)
Urobilinogen, UA: 0.2 mg/dL (ref 0.0–1.0)
pH: 5 (ref 5.0–8.0)

## 2014-10-31 LAB — CREATININE, SERUM
Creatinine, Ser: 1.43 mg/dL — ABNORMAL HIGH (ref 0.50–1.10)
GFR calc Af Amer: 36 mL/min — ABNORMAL LOW (ref >90)
GFR calc non Af Amer: 31 mL/min — ABNORMAL LOW (ref >90)

## 2014-10-31 LAB — CBC WITH DIFFERENTIAL/PLATELET
BASOS ABS: 0 10*3/uL (ref 0.0–0.1)
BASOS PCT: 0 % (ref 0–1)
Eosinophils Absolute: 0 10*3/uL (ref 0.0–0.7)
Eosinophils Relative: 0 % (ref 0–5)
HEMATOCRIT: 35.9 % — AB (ref 36.0–46.0)
Hemoglobin: 11.6 g/dL — ABNORMAL LOW (ref 12.0–15.0)
Lymphocytes Relative: 9 % — ABNORMAL LOW (ref 12–46)
Lymphs Abs: 0.8 10*3/uL (ref 0.7–4.0)
MCH: 28.6 pg (ref 26.0–34.0)
MCHC: 32.3 g/dL (ref 30.0–36.0)
MCV: 88.6 fL (ref 78.0–100.0)
MONO ABS: 0.4 10*3/uL (ref 0.1–1.0)
Monocytes Relative: 4 % (ref 3–12)
NEUTROS ABS: 8.5 10*3/uL — AB (ref 1.7–7.7)
Neutrophils Relative %: 87 % — ABNORMAL HIGH (ref 43–77)
Platelets: 222 10*3/uL (ref 150–400)
RBC: 4.05 MIL/uL (ref 3.87–5.11)
RDW: 15 % (ref 11.5–15.5)
WBC: 9.7 10*3/uL (ref 4.0–10.5)

## 2014-10-31 LAB — COMPREHENSIVE METABOLIC PANEL
ALK PHOS: 70 U/L (ref 39–117)
ALT: 15 U/L (ref 0–35)
ANION GAP: 6 (ref 5–15)
AST: 22 U/L (ref 0–37)
Albumin: 3.1 g/dL — ABNORMAL LOW (ref 3.5–5.2)
BILIRUBIN TOTAL: 0.4 mg/dL (ref 0.3–1.2)
BUN: 21 mg/dL (ref 6–23)
CO2: 29 mmol/L (ref 19–32)
Calcium: 8.7 mg/dL (ref 8.4–10.5)
Chloride: 101 mmol/L (ref 96–112)
Creatinine, Ser: 1.42 mg/dL — ABNORMAL HIGH (ref 0.50–1.10)
GFR calc non Af Amer: 32 mL/min — ABNORMAL LOW (ref >90)
GFR, EST AFRICAN AMERICAN: 37 mL/min — AB (ref >90)
GLUCOSE: 168 mg/dL — AB (ref 70–99)
Potassium: 4.1 mmol/L (ref 3.5–5.1)
Sodium: 136 mmol/L (ref 135–145)
TOTAL PROTEIN: 6 g/dL (ref 6.0–8.3)

## 2014-10-31 LAB — HEPATIC FUNCTION PANEL
ALK PHOS: 80 U/L (ref 39–117)
ALT: 17 U/L (ref 0–35)
AST: 23 U/L (ref 0–37)
Albumin: 3.7 g/dL (ref 3.5–5.2)
Bilirubin, Direct: <0.1 mg/dL (ref 0.0–0.5)
TOTAL PROTEIN: 6.8 g/dL (ref 6.0–8.3)
Total Bilirubin: 0.2 mg/dL — ABNORMAL LOW (ref 0.3–1.2)

## 2014-10-31 LAB — GLUCOSE, CAPILLARY
GLUCOSE-CAPILLARY: 182 mg/dL — AB (ref 70–99)
Glucose-Capillary: 178 mg/dL — ABNORMAL HIGH (ref 70–99)
Glucose-Capillary: 186 mg/dL — ABNORMAL HIGH (ref 70–99)
Glucose-Capillary: 122 mg/dL — ABNORMAL HIGH (ref 70–99)
Glucose-Capillary: 165 mg/dL — ABNORMAL HIGH (ref 70–99)
Glucose-Capillary: 152 mg/dL — ABNORMAL HIGH (ref 70–99)

## 2014-10-31 LAB — LIPASE, BLOOD: Lipase: 36 U/L (ref 11–59)

## 2014-10-31 MED ORDER — ONDANSETRON HCL 4 MG/2ML IJ SOLN: 4.0000 mg | Freq: Four times a day (QID) | INTRAMUSCULAR | Status: DC | PRN

## 2014-10-31 MED ORDER — SODIUM CHLORIDE 0.9 % IV SOLN: INTRAVENOUS | Status: DC

## 2014-10-31 MED ORDER — INSULIN ASPART 100 UNIT/ML ~~LOC~~ SOLN
0.0000 [IU] | SUBCUTANEOUS | Status: DC
Start: 2014-10-31 — End: 2014-11-04
  Administered 2014-10-31: 2 [IU] via SUBCUTANEOUS
  Filled 2014-10-31: qty 1

## 2014-10-31 MED ORDER — HEPARIN SODIUM (PORCINE) 5000 UNIT/ML IJ SOLN
5000.0000 [IU] | Freq: Three times a day (TID) | INTRAMUSCULAR | Status: DC
  Administered 2014-10-31 – 2014-11-04 (×13): 5000 [IU] via SUBCUTANEOUS
  Filled 2014-10-31 (×18): qty 1

## 2014-10-31 MED ORDER — SODIUM CHLORIDE 0.9 % IV SOLN
INTRAVENOUS | Status: DC
  Administered 2014-10-31 (×2): via INTRAVENOUS

## 2014-10-31 MED ORDER — ONDANSETRON HCL 4 MG PO TABS: 4.0000 mg | ORAL_TABLET | Freq: Four times a day (QID) | ORAL | Status: DC | PRN

## 2014-10-31 NOTE — Progress Notes (Signed)
Admission note:  Arrival Method: Arrived on stretcher from ED by RN Mental Orientation: Alert and oriented to self only Telemetry: Telemetry box 26 applied. CCMD notified, pt running NS Assessment: Completed, see doc flowsheets Skin: Dry and intact IV: IV in right AC infiltrated, IV team notified for new IV placement Pain: Pt sleeping comfortably at this time, no signs of pain Tubes: IV tubing secured Safety Measures: Fall risk, bed alarm on, non-slip socks placed, bed in lowest position Fall Prevention Safety Plan: Pt confused, unable to review with pt Admission Screening: Pt confused, unable to answer questions. Will complete once caregiver arrives [id number] Orientation: Patient has been oriented to the unit, staff and to the room. Pt lying comfortably in bed with no needs stated at this time. Call light within reach, will continue to monitor.  Shelbie Hutching, RN

## 2014-10-31 NOTE — Consult Note (Signed)
Alicia [name] 1922/11/01  [id number].   Primary Care MD: [name] PA-C Requesting MD: Dr. Shanon Brow Tat Chief Complaint/Reason for Consult: pSBO HPI: This is an 81 yo white female with an extensive PMH, including dementia.  She is unable to provide a history.  It appears from the chart that she has now been admitted 3 times over the last 6 months for psbo.  All of her CT scans since October show this chronic mechanical partial small bowel obstruction.  She was admitted yesterday due to nausea per epic H&P.  The patient states she is having some abdominal pain right now and she just threw up all over herself upon my arrival into the room.  No BMs reported.  CT scan this admission shows this chronic PSBO, but questions if she has an endoluminal mass causing her obstruction.  We have been asked to see her for further recommendations.  Of note, she does have a h/o ovarian cancer, s/p resection and chemo.  ROS : unable to obtain due to dementia  Family History  Problem Relation Age of Onset  . Cancer Mother     breast  . Stroke Father     Past Medical History  Diagnosis Date  . Dementia     significant  . Renal disease   . Memory loss 12/10/2012  . Depression 12/10/2012  . Anxiety state, unspecified 12/10/2012  . Spells 12/10/2012  . Essential hypertension, benign 12/10/2012  . Other and unspecified hyperlipidemia 12/10/2012  . Loss of weight 12/10/2012  . Carotid artery stenosis   . Ovarian cancer     treated with surgery & chemo  . Ankle fracture, right   . Unstable gait     mild  . Syncope 12/10/2012    Recurrent, sporadic episodes  . Carotid artery disease   . Parkinson disease   . Diabetes mellitus without complication   . Diverticulosis     Past Surgical History  Procedure Laterality Date  . Vesicovaginal fistula closure w/ tah  2005  . Oophorectomy  2007    for ovarian cancer  . Prolapsed uterine fibroid ligation    . Appendectomy    . Cataract extraction Bilateral    . Implantable loop recorder  11/14    MDT LINQ implanted for recurrent unexplained syncope by Dr Rayann Heman  . Abdominal hysterectomy    . Ankle surgery Left   . Loop recorder implant N/A 07/17/2013    Procedure: LOOP RECORDER IMPLANT;  Surgeon: Coralyn Mark, MD;  Location: [facility];  Service: Cardiovascular;  Laterality: N/A;    Social History:  reports that she has never smoked. She has never used smokeless tobacco. She reports that she does not drink alcohol or use illicit drugs.  Allergies:  Allergies  Allergen Reactions  . Iohexol Rash  . Ativan [Lorazepam] Other (See Comments)    Other reaction(s): Other (See Comments) MAKES HER AGITATED MAKES HER AGITATED  . Ciprofloxacin Other (See Comments)    weakness  . Keflex [Cephalexin] Other (See Comments)    Other reaction(s): Other (See Comments) weakness weakness  . Nsaids Other (See Comments)    Renal insufficiency  . Sulfa Antibiotics Hives and Itching  . Ivp Dye [Iodinated Diagnostic Agents] Rash    Medications Prior to Admission  Medication Sig Dispense Refill  . Albuterol Sulfate (PROAIR RESPICLICK) 259 (90 BASE) MCG/ACT AEPB Inhale 2 puffs into the lungs every 4 (four) hours as needed (for shortness of breath).     Marland Kitchen amLODipine (  NORVASC) 2.5 MG tablet Take 2.5 mg by mouth daily.    Marland Kitchen aspirin EC 81 MG tablet Take 81 mg by mouth daily.    Marland Kitchen atorvastatin (LIPITOR) 20 MG tablet Take 20 mg by mouth at bedtime.     . calcium carbonate (OS-CAL) 600 MG TABS tablet Take 600 mg by mouth at bedtime.    . docusate sodium (COLACE) 100 MG capsule Take 100 mg by mouth 2 (two) times daily.    Marland Kitchen loratadine (CLARITIN) 10 MG tablet Take 10 mg by mouth daily.     . memantine (NAMENDA XR) 14 MG CP24 24 hr capsule Take 1 capsule (14 mg total) by mouth daily. 7 capsule 0  . memantine (NAMENDA XR) 28 MG CP24 24 hr capsule Take 1 capsule (28 mg total) by mouth daily. 90 capsule 3  . Memantine HCl ER (NAMENDA XR) 21 MG CP24 Take 21 mg by  mouth daily. 7 capsule 0  . Multiple Vitamin (MULTIVITAMIN WITH MINERALS) TABS Take 1 tablet by mouth daily.    . sertraline (ZOLOFT) 50 MG tablet Take 50 mg by mouth daily.      Blood pressure 119/47, pulse 93, temperature 97.7 F (36.5 C), temperature source Oral, resp. rate 17, height _0  (1.575 m), weight 138 lb 3.2 oz (62.687 kg), SpO2 93 %. Physical Exam: General: pleasant, WD, WN white female who is laying in bed in NAD HEENT: head is normocephalic, atraumatic.  Sclera are noninjected.  PERRL.  Ears and nose without any masses or lesions.  Mouth is pink and moist Heart: regular, rate, and rhythm.  Normal s1,s2. No obvious murmurs, gallops, or rubs noted.  Palpable radial and pedal pulses bilaterally Lungs: CTAB, no wheezes, rhonchi, or rales noted.  Respiratory effort nonlabored Abd: soft, mildly tender, some distention, +BS, no masses, hernias, or organomegaly, midline scar noted from prior surgery MS: all 4 extremities are symmetrical with no cyanosis, clubbing, or edema. Skin: warm and dry with no masses, lesions, or rashes Psych: Alert and oriented to person.  She is unfortunately, unsure, whether she has any family or not.  She can't remember.  I believe she has a daughter.    Results for orders placed or performed during the hospital encounter of 10/30/14 (from the past 48 hour(s))  CBC     Status: Abnormal   Collection Time: 10/30/14  9:37 PM  Result Value Ref Range   WBC 12.6 (H) 4.0 - 10.5 K/uL   RBC 4.48 3.87 - 5.11 MIL/uL   Hemoglobin 12.9 12.0 - 15.0 g/dL   HCT 39.3 36.0 - 46.0 %   MCV 87.7 78.0 - 100.0 fL   MCH 28.8 26.0 - 34.0 pg   MCHC 32.8 30.0 - 36.0 g/dL   RDW 14.9 11.5 - 15.5 %   Platelets 248 150 - 400 K/uL  Basic metabolic panel     Status: Abnormal   Collection Time: 10/30/14  9:37 PM  Result Value Ref Range   Sodium 135 135 - 145 mmol/L   Potassium 4.2 3.5 - 5.1 mmol/L   Chloride 103 96 - 112 mmol/L   CO2 23 19 - 32 mmol/L   Glucose, Bld 181 (H)  70 - 99 mg/dL   BUN 21 6 - 23 mg/dL   Creatinine, Ser 1.32 (H) 0.50 - 1.10 mg/dL   Calcium 9.2 8.4 - 10.5 mg/dL   GFR calc non Af Amer 35 (L) >90 mL/min   GFR calc Af Amer 40 (L) >90 mL/min  Comment: (NOTE) The eGFR has been calculated using the CKD EPI equation. This calculation has not been validated in all clinical situations. eGFR's persistently <90 mL/min signify possible Chronic Kidney Disease.    Anion gap 9 5 - 15  BNP (order ONLY if patient complains of dyspnea/SOB AND you have documented it for THIS visit)     Status: None   Collection Time: 10/30/14  9:37 PM  Result Value Ref Range   B Natriuretic Peptide 94.0 0.0 - 100.0 pg/mL  Hepatic function panel     Status: Abnormal   Collection Time: 10/30/14  9:37 PM  Result Value Ref Range   Total Protein 6.8 6.0 - 8.3 g/dL   Albumin 3.7 3.5 - 5.2 g/dL   AST 23 0 - 37 U/L   ALT 17 0 - 35 U/L   Alkaline Phosphatase 80 39 - 117 U/L   Total Bilirubin 0.2 (L) 0.3 - 1.2 mg/dL   Bilirubin, Direct <0.1 0.0 - 0.5 mg/dL   Indirect Bilirubin NOT CALCULATED 0.3 - 0.9 mg/dL  Lipase, blood     Status: None   Collection Time: 10/30/14  9:37 PM  Result Value Ref Range   Lipase 36 11 - 59 U/L  I-stat troponin, ED (not at [facility])     Status: None   Collection Time: 10/30/14  9:43 PM  Result Value Ref Range   Troponin i, poc 0.01 0.00 - 0.08 ng/mL   Comment 3            Comment: Due to the release kinetics of cTnI, a negative result within the first hours of the onset of symptoms does not rule out myocardial infarction with certainty. If myocardial infarction is still suspected, repeat the test at appropriate intervals.   CBC     Status: Abnormal   Collection Time: 10/31/14  2:21 AM  Result Value Ref Range   WBC 13.8 (H) 4.0 - 10.5 K/uL   RBC 4.66 3.87 - 5.11 MIL/uL   Hemoglobin 13.6 12.0 - 15.0 g/dL   HCT 41.0 36.0 - 46.0 %   MCV 88.0 78.0 - 100.0 fL   MCH 29.2 26.0 - 34.0 pg   MCHC 33.2 30.0 - 36.0 g/dL   RDW 15.1 11.5 - 15.5  %   Platelets 241 150 - 400 K/uL  Creatinine, serum     Status: Abnormal   Collection Time: 10/31/14  2:21 AM  Result Value Ref Range   Creatinine, Ser 1.43 (H) 0.50 - 1.10 mg/dL   GFR calc non Af Amer 31 (L) >90 mL/min   GFR calc Af Amer 36 (L) >90 mL/min    Comment: (NOTE) The eGFR has been calculated using the CKD EPI equation. This calculation has not been validated in all clinical situations. eGFR's persistently <90 mL/min signify possible Chronic Kidney Disease.   Glucose, capillary     Status: Abnormal   Collection Time: 10/31/14  2:31 AM  Result Value Ref Range   Glucose-Capillary 178 (H) 70 - 99 mg/dL  Glucose, capillary     Status: Abnormal   Collection Time: 10/31/14  2:53 AM  Result Value Ref Range   Glucose-Capillary 182 (H) 70 - 99 mg/dL  Comprehensive metabolic panel     Status: Abnormal   Collection Time: 10/31/14  5:40 AM  Result Value Ref Range   Sodium 136 135 - 145 mmol/L   Potassium 4.1 3.5 - 5.1 mmol/L   Chloride 101 96 - 112 mmol/L   CO2 29 19 -  32 mmol/L   Glucose, Bld 168 (H) 70 - 99 mg/dL   BUN 21 6 - 23 mg/dL   Creatinine, Ser 8.28 (H) 0.50 - 1.10 mg/dL   Calcium 8.7 8.4 - 66.8 mg/dL   Total Protein 6.0 6.0 - 8.3 g/dL   Albumin 3.1 (L) 3.5 - 5.2 g/dL   AST 22 0 - 37 U/L   ALT 15 0 - 35 U/L   Alkaline Phosphatase 70 39 - 117 U/L   Total Bilirubin 0.4 0.3 - 1.2 mg/dL   GFR calc non Af Amer 32 (L) >90 mL/min   GFR calc Af Amer 37 (L) >90 mL/min    Comment: (NOTE) The eGFR has been calculated using the CKD EPI equation. This calculation has not been validated in all clinical situations. eGFR's persistently <90 mL/min signify possible Chronic Kidney Disease.    Anion gap 6 5 - 15  CBC WITH DIFFERENTIAL     Status: Abnormal   Collection Time: 10/31/14  5:40 AM  Result Value Ref Range   WBC 9.7 4.0 - 10.5 K/uL   RBC 4.05 3.87 - 5.11 MIL/uL   Hemoglobin 11.6 (L) 12.0 - 15.0 g/dL   HCT 93.7 (L) 59.1 - 97.9 %   MCV 88.6 78.0 - 100.0 fL   MCH  28.6 26.0 - 34.0 pg   MCHC 32.3 30.0 - 36.0 g/dL   RDW 43.9 12.9 - 90.2 %   Platelets 222 150 - 400 K/uL   Neutrophils Relative % 87 (H) 43 - 77 %   Neutro Abs 8.5 (H) 1.7 - 7.7 K/uL   Lymphocytes Relative 9 (L) 12 - 46 %   Lymphs Abs 0.8 0.7 - 4.0 K/uL   Monocytes Relative 4 3 - 12 %   Monocytes Absolute 0.4 0.1 - 1.0 K/uL   Eosinophils Relative 0 0 - 5 %   Eosinophils Absolute 0.0 0.0 - 0.7 K/uL   Basophils Relative 0 0 - 1 %   Basophils Absolute 0.0 0.0 - 0.1 K/uL  Glucose, capillary     Status: Abnormal   Collection Time: 10/31/14  7:51 AM  Result Value Ref Range   Glucose-Capillary 186 (H) 70 - 99 mg/dL  Urinalysis, Routine w reflex microscopic     Status: None   Collection Time: 10/31/14 10:54 AM  Result Value Ref Range   Color, Urine YELLOW YELLOW   APPearance CLEAR CLEAR   Specific Gravity, Urine 1.026 1.005 - 1.030   pH 5.0 5.0 - 8.0   Glucose, UA NEGATIVE NEGATIVE mg/dL   Hgb urine dipstick NEGATIVE NEGATIVE   Bilirubin Urine NEGATIVE NEGATIVE   Ketones, ur NEGATIVE NEGATIVE mg/dL   Protein, ur NEGATIVE NEGATIVE mg/dL   Urobilinogen, UA 0.2 0.0 - 1.0 mg/dL   Nitrite NEGATIVE NEGATIVE   Leukocytes, UA NEGATIVE NEGATIVE    Comment: MICROSCOPIC NOT DONE ON URINES WITH NEGATIVE PROTEIN, BLOOD, LEUKOCYTES, NITRITE, OR GLUCOSE <1000 mg/dL.  Glucose, capillary     Status: Abnormal   Collection Time: 10/31/14 10:59 AM  Result Value Ref Range   Glucose-Capillary 122 (H) 70 - 99 mg/dL   Ct Abdomen Pelvis Wo Contrast  10/31/2014   CLINICAL DATA:  Abdominal pain. History of small bowel obstructions.  EXAM: CT ABDOMEN AND PELVIS WITHOUT CONTRAST  TECHNIQUE: Multidetector CT imaging of the abdomen and pelvis was performed following the standard protocol without IV contrast.  COMPARISON:  CT 09/18/2014, plain films 10/31/2014  FINDINGS: Lower chest: Mild atelectasis at the lung bases is increased compared  to prior. Right lower lobe atelectasis is more dense than the left.   Hepatobiliary: Non IV contrast images demonstrate no focal hepatic lesion. The gallbladder is normal volume.  Pancreas: Pancreas is normal. No ductal dilatation. No pancreatic inflammation.  Spleen: Normal spleen.  Adrenals/urinary tract: Adrenal glands are normal. No nephrolithiasis or ureterolithiasis. No obstructive uropathy.  Stomach/Bowel: The stomach is distended. There is a large hiatal hernia. The majority of the oral contrast remains within the stomach. The duodenum is normal caliber. The proximal jejunum is normal caliber. The distal ileum is dilated over a long segment involving the near entirety of the ileum. Segments are dilated up to 3.2 cm. There are several air-fluid levels. There is a smooth transition to normal caliber distal ileum leading up to the terminal ileum seen on images 44 through 52 of the axial series and clearly demonstrated on images 35 through 46 of the coronal data set. The distal ileum leading up to terminal ileum measures 15 mm while the ileum proximal to this transition Mmeasures 33 mm. This pattern of caliber change is very similar to comparison CT of 09/18/2014. No clear evidence of adhesions. Potential endoluminal lesion on coronal image 35, series 202) and axial image 45. There is no pneumatosis or portal venous gas. No free fluid in the head or pelvis.  The ascending, transverse, and descending colon relatively collapsed. There is small amount of stool in the rectum. There scattered diverticula throughout the colon without acute inflammation.  Vascular/Lymphatic: Abdominal aorta is normal caliber with atherosclerotic calcification. There is no retroperitoneal or periportal lymphadenopathy. No pelvic lymphadenopathy.  Reproductive: Post hysterectomy  Musculoskeletal: No aggressive osseous lesion.  IMPRESSION: 1. Chronic partial mechanical small bowel obstruction with caliber change in the distal ileum approximately 8 cm from the terminal ileum. Potential endoluminal lesion  identified but poorly characterized without IV contrast. Recommend CT enterography or capsule optical evaluation. 2. Colonic diverticulosis without acute diverticulitis. 3. Increasing basilar atelectasis more dense on the right. These results will be called to the ordering clinician or representative by the Radiologist Assistant, and communication documented in the PACS or zVision Dashboard.   Electronically Signed   By: Suzy Bouchard M.D.   On: 10/31/2014 12:34   Dg Chest 2 View  10/30/2014   CLINICAL DATA:  Chest pain post choking episode while eating a pulled pork sandwhich, was able to cough it up, single episode of vomiting follow by substernal chest pain  EXAM: CHEST  2 VIEW  COMPARISON:  10/17/2014  FINDINGS: Loop recorder projects over cardiac silhouette.  Upper normal heart size.  Normal mediastinal contours and pulmonary vascularity.  Atherosclerotic calcification aorta.  Minimal bibasilar atelectasis.  Lungs otherwise clear.  No pleural effusion or pneumothorax.  Bones demineralized.  IMPRESSION: Minimal bibasilar atelectasis.   Electronically Signed   By: Lavonia Dana M.D.   On: 10/30/2014 22:21   Dg Abd 1 View  10/31/2014   CLINICAL DATA:  Emesis  EXAM: ABDOMEN - 1 VIEW  COMPARISON:  None.  FINDINGS: The bowel gas pattern is normal. No radio-opaque calculi or other significant radiographic abnormality are seen.  IMPRESSION: Negative.   Electronically Signed   By: Andreas Newport M.D.   On: 10/31/2014 01:51       Assessment/Plan 1. Chronic partial small bowel obstruction 2. Dementia 3. Parkinson's disease 4. DM 5. H/o ovarian cancer 6. CKD 7. Syncope 8. HTN  Plan: 1. The patient does have a recurrent partial small bowel obstruction.  There is a concern on  this CT scan for a possible endoluminal lesion that may be contributing to these recurrent episodes.  She has just now had emesis and complains of nausea and abdominal pain.  We will place an NGT to LIWS and repeat abdominal  films tomorrow to follow her CT oral contrast.  It may be possible to further evaluate this area of concern with a CTE.  I will d/w Dr. Grandville Silos, though.  This patient has significant dementia with other health problems.  There is no family at the bedside.  I'm not sure how aggressive her family would like to be with her care, given the diagnoses she carries.  For now, we will treat conservatively and more recommendations after discussion with MD and family, eventually.  Thank you for this consultation.  Ria Redcay E 10/31/2014, 1:41 PM Pager: [phone]

## 2014-10-31 NOTE — ED Notes (Signed)
Discussed pt's case with admitting MD, Dr. Ernestina Patches. See admission orders

## 2014-10-31 NOTE — Discharge Instructions (Signed)
Choking °Choking occurs when a food or object gets stuck in the throat or trachea, blocking the airway. If the airway is partly blocked, coughing will usually cause the food or object to come out. If the airway is completely blocked, immediate action is needed to help it come out. A complete airway blockage is life threatening because it causes breathing to stop. Choking is a true medical emergency that requires fast, appropriate action by anyone available. °SIGNS OF AIRWAY BLOCKAGE °There is a partial airway blockage if you or the person who is choking is:  °· Able to breathe and speak. °· Coughing loudly. °· Making loud noises. °There is a complete airway blockage if you or the person who is choking is:  °· Unable to breathe. °· Making soft or high-pitched sounds while breathing. °· Unable to cough or coughing weakly, ineffectively, or silently. °· Unable to cry, speak, or make sounds. °· Turning blue. °· Holding the neck with both arms. This is the universal sign of choking. °WHAT TO DO IF CHOKING OCCURS °If there is a partial airway blockage, allow coughing to clear the airway. Do not try to drink until the food or object comes out. If someone else has a partial airway blockage, do not interfere. Stay with him or her and watch for signs of complete airway blockage until the food or object comes out.  °If there is a complete airway blockage or if there is a partial airway blockage and the food or object does not come out, perform abdominal thrusts (also referred to as the Heimlich maneuver). Abdominal thrusts are used to create an artificial cough to try to clear the airway. Performing abdominal thrusts is part of a series of steps that should be done to help someone who is choking. Abdominal thrusts are usually done by someone else, but if you are alone, you can perform abdominal thrusts on yourself. Follow the procedure below that best fits your situation.  °IF SOMEONE ELSE IS CHOKING: °For a conscious adult:    °1. Ask the person whether he or she is choking. If the person nods, continue to step 2. °2. Stand or kneel behind the person and lean him or her forward slightly. °3. Make a fist with 1 hand, put your arms around the person, and grasp your fist with your other hand. Place the thumb side of your fist in the person's abdomen, just below the ribs. °4. Press inward and upward with both hands. °5. Repeat this maneuver until the object comes out and the person is able to breathe or until the person loses consciousness. °For an unconscious adult: °1. Shout for help. If someone responds, have him or her call local emergency services (911 in [place] If no one responds, call local emergency services yourself if possible. °2. Begin CPR, starting with compressions. Every time you open the airway to give rescue breaths, open the person's mouth. If you can see the food or object and it can be easily pulled out, remove it with your fingers. °3. After 5 cycles or 2 minutes of CPR, call local emergency services (911 in [place] if you or someone else did not already call. °For a conscious adult who is obese or in the later stages of pregnancy: °Abdominal thrusts may not be effective when helping people who are in the later stages of pregnancy or who are obese. In these instances, chest thrusts can be used.  °1. Ask the person whether he or she is choking. If the person   nods and has signs of complete airway blockage, continue to step 2. °2. Stand behind the person and wrap your arms around his or her chest (with your arms under the person's armpits). °3. Make a fist with 1 hand. Place the thumb side of your fist on the middle of the person's breastbone. °4. Grab your fist with your other hand and thrust backward. Continue this until the object comes out or until the person becomes unconscious. °For an unconscious adult who is obese or in the later stages of pregnancy:  °1. Shout for help. If someone responds, have him or her call local  emergency services (911 in [place] If no one responds, call local emergency services yourself if possible. °2. Begin CPR, starting with compressions. Every time you open the airway to give rescue breaths, open the person's mouth. If you can see the food or object and it can be easily pulled out, remove it with your fingers. °3. After 5 cycles or 2 minutes of CPR, call local emergency services (911 in [place] if you or someone else did not already call. °Note that abdominal thrusts (below the rib cage) should be used for a pregnant woman if possible. This should be possible until the later stages of pregnancy when there is no longer enough room between the enlarging uterus and the rib cage to perform the maneuver. At that point, chest thrusts must be used as described. °IF YOU ARE CHOKING: °1. Call local emergency services (911 in [place] if near a landline. Do not worry about communicating what is happening. Do not hang up the phone. Someone may be sent to help you anyway. °2. Make a fist with 1 hand. Put the thumb side of the fist against your stomach, just above the belly button and well below the breastbone. If you are pregnant or obese, put your fist on your chest instead, just below the breastbone and just above your lowest ribs. °3. Hold your fist with your other hand and bend over a hard surface, such as a table or chair. °4. Forcefully push your fist in and up. °5. Continue to do this until the food or object comes out. °PREVENTION  °To be prepared if choking occurs, learn how to correctly perform abdominal thrusts and give CPR by taking a certified first-aid training course.  °SEEK IMMEDIATE MEDICAL CARE IF: °· You have a fever after choking stops. °· You have problems breathing after choking stops. °· You received the Heimlich maneuver. °MAKE SURE YOU: °· Understand these instructions. °· Watch your condition. °· Get help right away if you are not doing well or get worse. °Document Released: 10/06/2004 Document  Revised: 01/13/2014 Document Reviewed: 04/10/2012 °[employer]® Patient Information ©2015 [employer]. This information is not intended to replace advice given to you by your health care provider. Make sure you discuss any questions you have with your health care provider. ° °

## 2014-10-31 NOTE — ED Notes (Signed)
Attempted to reestablish IV x2, unsuccessful

## 2014-10-31 NOTE — H&P (Addendum)
Hospitalist Admission History and Physical  Patient name: Alicia [name] Medical record number: [id number] Date of birth: 1923-07-09 Age: 81 y.o. Gender: female  Primary Care Provider: Shellia Carwin, PA-C  Chief Complaint: emesis  History of Present Illness:This is a 81 y.o. year old female with significant past medical history of dementia, parkinson, hx/o SBO, hx/o ovarian cancer s/p surgery and chemo tx presenting with emesis. Level V caveat as pt minimally conversive. No family at bedside. Primary history from documentation. Pt arrived from home s/p choking episode and emesis x1. After emesis, pt had episode of chest pain that self resolved. Pt then had trial of liquids that stayed down for approx 1 hour, then pt had another episode of emesis. Family present at bedside earlier in the evening. Per report, pt w/ nausea and malaise earlier in the day. Pt noted to have had 2 admissions in the past 4 months secondary to SBO/partial SBO.  Presented to the ER afebrile, hemodynamically stable. WBC 12.6, Cr 1.32., Glu 182. CXR WNL. EDP pending f/u from GI Alicia [name]).  Assessment and Plan: Alicia [name] is a 81 y.o. year old female presenting with emesis    Active Problems:   Emesis   1- Emesis  -infection vs. Obstruction -screening KUB-CT abd and pel as clinically indicated to eval for SBO recurrence -NPO  -NGT prn   2-HTN -BP stable  -hold orals overnight  3-Dementia -end stage clinically  -hold oral regimen overnight    FEN/GI: NPO Prophylaxis: sub q heparin  Disposition: pending further evaluation  Code Status:Full Code    Patient Active Problem List   Diagnosis Date Noted  . Emesis 10/31/2014  . Abdominal pain   . Hematemesis with nausea   . SBO (small bowel obstruction) 09/18/2014  . Partial small bowel obstruction 06/13/2014  . Sliding hiatal hernia 06/13/2014  . Diabetes mellitus without complication   . Nausea 06/12/2014  . Paroxysmal supraventricular  tachycardia 06/12/2014  . History of syncope 06/12/2014  . GERD (gastroesophageal reflux disease) 06/12/2014  . Paralysis agitans 01/28/2014  . Urinary tract infection 01/12/2014  . Dementia 01/09/2014  . HCAP (healthcare-associated pneumonia) 12/19/2013  . Pneumonia 12/19/2013  . Nausea & vomiting 12/19/2013  . HLD (hyperlipidemia) 12/19/2013  . Cancer of ovary 11/30/2013  . OP (osteoporosis) 11/30/2013  . Chronic kidney disease (CKD), stage IV (severe) 11/30/2013  . Memory loss 12/10/2012  . Depression 12/10/2012  . Anxiety state, unspecified 12/10/2012  . Syncope 12/10/2012  . Spells 12/10/2012  . Essential hypertension, benign 12/10/2012  . Other and unspecified hyperlipidemia 12/10/2012  . Loss of weight 12/10/2012   Past Medical History: Past Medical History  Diagnosis Date  . Dementia     significant  . Renal disease   . Memory loss 12/10/2012  . Depression 12/10/2012  . Anxiety state, unspecified 12/10/2012  . Spells 12/10/2012  . Essential hypertension, benign 12/10/2012  . Other and unspecified hyperlipidemia 12/10/2012  . Loss of weight 12/10/2012  . Carotid artery stenosis   . Ovarian cancer     treated with surgery & chemo  . Ankle fracture, right   . Unstable gait     mild  . Syncope 12/10/2012    Recurrent, sporadic episodes  . Carotid artery disease   . Parkinson disease   . Diabetes mellitus without complication   . Diverticulosis     Past Surgical History: Past Surgical History  Procedure Laterality Date  . Vesicovaginal fistula closure w/ tah  2005  . Oophorectomy  2007    for ovarian cancer  . Prolapsed uterine fibroid ligation    . Appendectomy    . Cataract extraction Bilateral   . Implantable loop recorder  11/14    MDT LINQ implanted for recurrent unexplained syncope by Alicia [name]  . Abdominal hysterectomy    . Ankle surgery Left   . Loop recorder implant N/A 07/17/2013    Procedure: LOOP RECORDER IMPLANT;  Surgeon: Alicia Mark, MD;   Location: [facility];  Service: Cardiovascular;  Laterality: N/A;    Social History: History   Social History  . Marital Status: Married    Spouse Name: N/A  . Number of Children: N/A  . Years of Education: N/A   Social History Main Topics  . Smoking status: Never Smoker   . Smokeless tobacco: Never Used  . Alcohol Use: No  . Drug Use: No  . Sexual Activity: No   Other Topics Concern  . None   Social History Narrative   Lives with son and daughter in law    Family History: Family History  Problem Relation Age of Onset  . Cancer Mother     breast  . Stroke Father     Allergies: Allergies  Allergen Reactions  . Iohexol Rash  . Ativan [Lorazepam] Other (See Comments)    Other reaction(s): Other (See Comments) MAKES HER AGITATED MAKES HER AGITATED  . Ciprofloxacin Other (See Comments)    weakness  . Keflex [Cephalexin] Other (See Comments)    Other reaction(s): Other (See Comments) weakness weakness  . Nsaids Other (See Comments)    Renal insufficiency  . Sulfa Antibiotics Hives and Itching  . Ivp Dye [Iodinated Diagnostic Agents] Rash    Current Facility-Administered Medications  Medication Dose Route Frequency Provider Last Rate Last Dose  . 0.9 %  sodium chloride infusion   Intravenous Continuous Alicia Shanks, MD      . 0.9 %  sodium chloride infusion   Intravenous Continuous Alicia Howells, MD      . heparin injection 5,000 Units  5,000 Units Subcutaneous 3 times per day Alicia Howells, MD      . ondansetron [facility]) tablet 4 mg  4 mg Oral Q6H PRN Alicia Howells, MD       Or  . ondansetron [facility]) injection 4 mg  4 mg Intravenous Q6H PRN Alicia Howells, MD       Current Outpatient Prescriptions  Medication Sig Dispense Refill  . Albuterol Sulfate (PROAIR RESPICLICK) 948 (90 BASE) MCG/ACT AEPB Inhale 2 puffs into the lungs every 4 (four) hours as needed (for shortness of breath).     Marland Kitchen amLODipine (NORVASC) 2.5 MG tablet Take 2.5 mg by mouth daily.     Marland Kitchen aspirin EC 81 MG tablet Take 81 mg by mouth daily.    Marland Kitchen atorvastatin (LIPITOR) 20 MG tablet Take 20 mg by mouth at bedtime.     . calcium carbonate (OS-CAL) 600 MG TABS tablet Take 600 mg by mouth at bedtime.    . docusate sodium (COLACE) 100 MG capsule Take 100 mg by mouth 2 (two) times daily.    Marland Kitchen loratadine (CLARITIN) 10 MG tablet Take 10 mg by mouth daily.     . memantine (NAMENDA XR) 14 MG CP24 24 hr capsule Take 1 capsule (14 mg total) by mouth daily. 7 capsule 0  . memantine (NAMENDA XR) 28 MG CP24 24 hr capsule Take 1 capsule (28 mg total) by mouth daily. 90 capsule 3  . Memantine  HCl ER (NAMENDA XR) 21 MG CP24 Take 21 mg by mouth daily. 7 capsule 0  . Multiple Vitamin (MULTIVITAMIN WITH MINERALS) TABS Take 1 tablet by mouth daily.    . sertraline (ZOLOFT) 50 MG tablet Take 50 mg by mouth daily.    . [DISCONTINUED] famotidine (PEPCID) 20 MG tablet Take 1 tablet (20 mg total) by mouth 2 (two) times daily. 10 tablet 0   Review Of Systems: 12 point ROS negative except as noted above in HPI.  Physical Exam: Filed Vitals:   10/31/14 0000  BP: 120/64  Pulse: 88  Temp:   Resp: 23    General: minimally verbal HEENT: PERRLA and extra ocular movement intact Heart: S1, S2 normal, no murmur, rub or gallop, regular rate and rhythm Lungs: clear to auscultation, no wheezes or rales and unlabored breathing Abdomen: + bowel sounds, minimal to mild abd pain on exam Extremities: extremities normal, atraumatic, no cyanosis or edema Skin:no rashes, no ecchymoses Neurology: normal without focal findings  Labs and Imaging: Lab Results  Component Value Date/Time   NA 135 10/30/2014 09:37 PM   K 4.2 10/30/2014 09:37 PM   CL 103 10/30/2014 09:37 PM   CO2 23 10/30/2014 09:37 PM   BUN 21 10/30/2014 09:37 PM   CREATININE 1.32* 10/30/2014 09:37 PM   GLUCOSE 181* 10/30/2014 09:37 PM   Lab Results  Component Value Date   WBC 12.6* 10/30/2014   HGB 12.9 10/30/2014   HCT 39.3 10/30/2014    MCV 87.7 10/30/2014   PLT 248 10/30/2014    Dg Chest 2 View  10/30/2014   CLINICAL DATA:  Chest pain post choking episode while eating a pulled pork sandwhich, was able to cough it up, single episode of vomiting follow by substernal chest pain  EXAM: CHEST  2 VIEW  COMPARISON:  10/17/2014  FINDINGS: Loop recorder projects over cardiac silhouette.  Upper normal heart size.  Normal mediastinal contours and pulmonary vascularity.  Atherosclerotic calcification aorta.  Minimal bibasilar atelectasis.  Lungs otherwise clear.  No pleural effusion or pneumothorax.  Bones demineralized.  IMPRESSION: Minimal bibasilar atelectasis.   Electronically Signed   By: Lavonia Dana M.D.   On: 10/30/2014 22:21           Alicia Howells MD  Pager: [phone]

## 2014-10-31 NOTE — Progress Notes (Signed)
PROGRESS NOTE  Alicia [name] CZY:[id number] DOB: 1923-08-08 DOA: 10/30/2014 PCP: Shellia Carwin, PA-C  Brief History 81 year old female with a history of dementia, parkinsonism, recurrent small bowel obstruction, ovarian cancer, hypertension, PSVT, and diet-controlled diabetes mellitus presents with one-day history of vomiting and abdominal pain. The patient is unable to provide any history due to her advanced dementia. History is obtained from the patient's daughter-in-law, CO2 Goerke.  Apparently, the patient has been in her usual state of health until 10/30/2014 when she ate a pulled pork sandwich. She began choking on it and then ended up having vomiting.  The patient was brought to the emergency department where she was given clear liquids. She ended up having more vomiting. There's been no reports of fevers, chills, chest pain, shortness breath, diarrhea, abdominal pain, dysuria.  Assessment/Plan: Intractable vomiting/abdominal pain -Concern about recurrent SBO -Patient has had a history of recurrent SBO in the past -CT abdomen and pelvis with oral contrast only -KUB at the time of admission negative for obstruction, but this has been negative in the past in the setting of SBO -Certainly, the patient's large hiatus hernia may be contributing to her vomiting -Continue IV fluids Hypertension -Remain off antihypertensive medications at this time -Monitor blood pressure which has remained stable Diabetes mellitus type 2 -Diet controlled -Hemoglobin A1c -Given the patient's age and comorbidities, allow for limited role glycemic control CKD stage III -Baseline creatinine 1.1-1.4 Dementia -restart Namenda when able to take po HLD -hold atorvastatin for now -restart when able to take po  Family Communication:   Daughter in law--sue updated on phone Disposition Plan:   Home when medically stable       Procedures/Studies: Dg Chest 2 View  10/30/2014   CLINICAL  DATA:  Chest pain post choking episode while eating a pulled pork sandwhich, was able to cough it up, single episode of vomiting follow by substernal chest pain  EXAM: CHEST  2 VIEW  COMPARISON:  10/17/2014  FINDINGS: Loop recorder projects over cardiac silhouette.  Upper normal heart size.  Normal mediastinal contours and pulmonary vascularity.  Atherosclerotic calcification aorta.  Minimal bibasilar atelectasis.  Lungs otherwise clear.  No pleural effusion or pneumothorax.  Bones demineralized.  IMPRESSION: Minimal bibasilar atelectasis.   Electronically Signed   By: Lavonia Dana M.D.   On: 10/30/2014 22:21   Dg Abd 1 View  10/31/2014   CLINICAL DATA:  Emesis  EXAM: ABDOMEN - 1 VIEW  COMPARISON:  None.  FINDINGS: The bowel gas pattern is normal. No radio-opaque calculi or other significant radiographic abnormality are seen.  IMPRESSION: Negative.   Electronically Signed   By: Andreas Newport M.D.   On: 10/31/2014 01:51         Subjective: Patient is awake and alert but pleasantly confused. No reports of vomiting, diarrhea, uncontrolled pain, respiratory distress.   Objective: Filed Vitals:   10/31/14 0000 10/31/14 0123 10/31/14 0259 10/31/14 0421  BP: 120/64 158/72 167/67 124/60  Pulse: 88 96 95 92  Temp:   97.6 F (36.4 C) 98.6 F (37 C)  TempSrc:   Oral Oral  Resp: 23  18 16   Height:   5\' 2"  (1.575 m)   Weight:   62.687 kg (138 lb 3.2 oz)   SpO2: 98% 94% 94% 94%   No intake or output data in the 24 hours ending 10/31/14 0833 Weight change:  Exam:   General:  Pt is alert, follows commands appropriately,  not in acute distress  HEENT: No icterus, No thrush,[place]/AT  Cardiovascular: RRR, S1/S2, no rubs, no gallops  Respiratory: CTA bilaterally, no wheezing, no crackles, no rhonchi  Abdomen: Soft/+BS, diffusely tender without any rebound, non distended, no guarding  Extremities: No edema, No lymphangitis, No petechiae, No rashes, no synovitis  Data Reviewed: Basic Metabolic  Panel:  Recent Labs Lab 10/30/14 2137 10/31/14 0221 10/31/14 0540  NA 135  --  136  K 4.2  --  4.1  CL 103  --  101  CO2 23  --  29  GLUCOSE 181*  --  168*  BUN 21  --  21  CREATININE 1.32* 1.43* 1.42*  CALCIUM 9.2  --  8.7   Liver Function Tests:  Recent Labs Lab 10/30/14 2137 10/31/14 0540  AST 23 22  ALT 17 15  ALKPHOS 80 70  BILITOT 0.2* 0.4  PROT 6.8 6.0  ALBUMIN 3.7 3.1*    Recent Labs Lab 10/30/14 2137  LIPASE 36   No results for input(s): AMMONIA in the last 168 hours. CBC:  Recent Labs Lab 10/30/14 2137 10/31/14 0221 10/31/14 0540  WBC 12.6* 13.8* 9.7  NEUTROABS  --   --  8.5*  HGB 12.9 13.6 11.6*  HCT 39.3 41.0 35.9*  MCV 87.7 88.0 88.6  PLT 248 241 222   Cardiac Enzymes: No results for input(s): CKTOTAL, CKMB, CKMBINDEX, TROPONINI in the last 168 hours. BNP: Invalid input(s): POCBNP CBG:  Recent Labs Lab 10/31/14 0231 10/31/14 0253 10/31/14 0751  GLUCAP 178* 182* 186*    No results found for this or any previous visit (from the past 240 hour(s)).   Scheduled Meds: . heparin  5,000 Units Subcutaneous 3 times per day  . insulin aspart  0-9 Units Subcutaneous 6 times per day   Continuous Infusions: . sodium chloride    . sodium chloride 100 mL/hr at 10/31/14 0302     Shealeigh Dunstan, DO  [facility] Pager [phone]  If 7PM-7AM, please contact night-coverage [email or web address] Password [facility] 10/31/2014, 8:33 AM

## 2014-10-31 NOTE — Progress Notes (Signed)
CT abdomen and pelvis revealed dilated distal ileum suggestive again of mechanical PSBO.  There is also concern about possible endoluminal lesion.  I have consulted general surgery for opinion  DTat

## 2014-10-31 NOTE — ED Notes (Signed)
Rounded on patient in preparation for discharge, pt had removed IV and vomited on self. Dr Johnney Killian at bedside.

## 2014-10-31 NOTE — ED Provider Notes (Addendum)
CSN: [id number]     Arrival date & time 10/30/14  2119 History   First MD Initiated Contact with Patient 10/30/14 2123     Chief Complaint  Patient presents with  . Chest Pain  . Choking     (Consider location/radiation/quality/duration/timing/severity/associated sxs/prior Treatment) HPI The report from EMS is that the patient had been at home eating a pulled pork sandwich and began to choke on it. On route the patient reportedly vomited up the material and did not have any further choking or vomiting. To EMS the patient had reported chest pain after vomiting the sandwich material. At the time of my evaluation the patient denied she was experiencing any pain or difficulty breathing. At that time there was no additional history from the patient regarding medical illness.   Several hours later family members arrived and described the patient is having some incrementally developing appearance of weakness and sweatiness by later afternoon. They report she seemed to have been better around dinner time and thus she ate her dinner and the other symptoms develop. Past Medical History  Diagnosis Date  . Dementia     significant  . Renal disease   . Memory loss 12/10/2012  . Depression 12/10/2012  . Anxiety state, unspecified 12/10/2012  . Spells 12/10/2012  . Essential hypertension, benign 12/10/2012  . Other and unspecified hyperlipidemia 12/10/2012  . Loss of weight 12/10/2012  . Carotid artery stenosis   . Ovarian cancer     treated with surgery & chemo  . Ankle fracture, right   . Unstable gait     mild  . Syncope 12/10/2012    Recurrent, sporadic episodes  . Carotid artery disease   . Parkinson disease   . Diabetes mellitus without complication   . Diverticulosis    Past Surgical History  Procedure Laterality Date  . Vesicovaginal fistula closure w/ tah  2005  . Oophorectomy  2007    for ovarian cancer  . Prolapsed uterine fibroid ligation    . Appendectomy    . Cataract extraction  Bilateral   . Implantable loop recorder  11/14    MDT LINQ implanted for recurrent unexplained syncope by Dr Rayann Heman  . Abdominal hysterectomy    . Ankle surgery Left   . Loop recorder implant N/A 07/17/2013    Procedure: LOOP RECORDER IMPLANT;  Surgeon: Coralyn Mark, MD;  Location: [facility];  Service: Cardiovascular;  Laterality: N/A;   Family History  Problem Relation Age of Onset  . Cancer Mother     breast  . Stroke Father    History  Substance Use Topics  . Smoking status: Never Smoker   . Smokeless tobacco: Never Used  . Alcohol Use: No   OB History    No data available     Review of Systems  Patient cannot provide review systems due to dementia.  Allergies  Iohexol; Ativan; Ciprofloxacin; Keflex; Nsaids; Sulfa antibiotics; and Ivp dye  Home Medications   Prior to Admission medications   Medication Sig Start Date End Date Taking? Authorizing Provider  Albuterol Sulfate (PROAIR RESPICLICK) 657 (90 BASE) MCG/ACT AEPB Inhale 2 puffs into the lungs every 4 (four) hours as needed (for shortness of breath).  05/08/14   Historical Provider, MD  amLODipine (NORVASC) 2.5 MG tablet Take 2.5 mg by mouth daily.    Historical Provider, MD  aspirin EC 81 MG tablet Take 81 mg by mouth daily.    Historical Provider, MD  atorvastatin (LIPITOR) 20 MG tablet  Take 20 mg by mouth at bedtime.     Historical Provider, MD  calcium carbonate (OS-CAL) 600 MG TABS tablet Take 600 mg by mouth at bedtime.    Historical Provider, MD  docusate sodium (COLACE) 100 MG capsule Take 100 mg by mouth 2 (two) times daily.    Historical Provider, MD  loratadine (CLARITIN) 10 MG tablet Take 10 mg by mouth daily.  11/30/13   Historical Provider, MD  memantine (NAMENDA XR) 14 MG CP24 24 hr capsule Take 1 capsule (14 mg total) by mouth daily. 10/30/14   Star Age, MD  memantine (NAMENDA XR) 28 MG CP24 24 hr capsule Take 1 capsule (28 mg total) by mouth daily. 10/30/14   Star Age, MD  Memantine HCl ER  (NAMENDA XR) 21 MG CP24 Take 21 mg by mouth daily. 10/30/14   Star Age, MD  Multiple Vitamin (MULTIVITAMIN WITH MINERALS) TABS Take 1 tablet by mouth daily.    Historical Provider, MD  sertraline (ZOLOFT) 50 MG tablet Take 50 mg by mouth daily.    Historical Provider, MD   BP 158/72 mmHg  Pulse 96  Temp(Src) 97.6 F (36.4 C) (Oral)  Resp 23  SpO2 94% Physical Exam  Constitutional: She appears well-developed and well-nourished.  The patient is alert and in appearance and she has no respiratory distress.  HENT:  Head: Normocephalic and atraumatic.  Mouth/Throat: Oropharynx is clear and moist. No oropharyngeal exudate.  Eyes: EOM are normal. Pupils are equal, round, and reactive to light.  Neck: Neck supple.  Cardiovascular: Normal rate, regular rhythm, normal heart sounds and intact distal pulses.   Pulmonary/Chest: Effort normal and breath sounds normal.  Abdominal: Soft. Bowel sounds are normal. She exhibits no distension. There is no tenderness.  Musculoskeletal: Normal range of motion. She exhibits no edema.  Neurological: She is alert. She has normal strength. Coordination normal. GCS eye subscore is 4. GCS verbal subscore is 5. GCS motor subscore is 6.  Patient is mildly confused concerning her history however her speech is clear and she is pleasant answering questions.  Skin: Skin is warm, dry and intact.  Psychiatric: She has a normal mood and affect.    ED Course  Angiocath insertion Date/Time: 10/31/2014 2:32 AM Performed by: Charlesetta Shanks Authorized by: Charlesetta Shanks Comments: Ultrasound guided peripheral IV placement due to nursing staff inability to obtain peripheral IV after initial IV had been pulled out. Ultrasound identified a left popliteal antecubital vein. Routine prep. Placement of 20-gauge IV with good blood return and flushed easily.   (including critical care time) Labs Review Labs Reviewed  CBC - Abnormal; Notable for the following:    WBC 12.6 (*)     All other components within normal limits  BASIC METABOLIC PANEL - Abnormal; Notable for the following:    Glucose, Bld 181 (*)    Creatinine, Ser 1.32 (*)    GFR calc non Af Amer 35 (*)    GFR calc Af Amer 40 (*)    All other components within normal limits  BRAIN NATRIURETIC PEPTIDE  HEPATIC FUNCTION PANEL  LIPASE, BLOOD  CBC  CREATININE, SERUM  COMPREHENSIVE METABOLIC PANEL  CBC WITH DIFFERENTIAL/PLATELET  HEMOGLOBIN A1C  I-STAT TROPOININ, ED    Imaging Review Dg Chest 2 View  10/30/2014   CLINICAL DATA:  Chest pain post choking episode while eating a pulled pork sandwhich, was able to cough it up, single episode of vomiting follow by substernal chest pain  EXAM: CHEST  2 VIEW  COMPARISON:  10/17/2014  FINDINGS: Loop recorder projects over cardiac silhouette.  Upper normal heart size.  Normal mediastinal contours and pulmonary vascularity.  Atherosclerotic calcification aorta.  Minimal bibasilar atelectasis.  Lungs otherwise clear.  No pleural effusion or pneumothorax.  Bones demineralized.  IMPRESSION: Minimal bibasilar atelectasis.   Electronically Signed   By: Lavonia Dana M.D.   On: 10/30/2014 22:21     EKG Interpretation   Date/Time:  Thursday October 30 2014 21:26:30 EST Ventricular Rate:  90 PR Interval:  175 QRS Duration: 118 QT Interval:  396 QTC Calculation: 484 R Axis:   54 Text Interpretation:  Sinus rhythm Left atrial enlargement Incomplete  right bundle branch block Baseline wander in lead(s) V3 agree. c/w old  Confirmed by Johnney Killian, MD, Jeannie Done [phone]) on 10/31/2014 12:15:05 AM     Consult:GI, as the patient does not definitively have food impaction based on her clinical course in the emergency department. She will continue to be observed and have a trial of by mouth intake and GI will be contacted for further management if food impaction becomes a likely diagnosis. Consult: Hospitalist, the patient will be an observation for vomiting versus partial food  obstruction. MDM   Final diagnoses:  Choking due to food (regurgitated), initial encounter   The patient had an initial trial of water and then subsequently applesauce which she appeared to have passed. Family members however arrived and the patient had vomited brown tinged material about an hour and a half after having had a trial of by mouth intake. At this point it is unclear she has potential for partial food impaction. She has not had any difficulty with saliva and appeared to have tolerated oral intake. Once family arrived the history suggests that she may have been developing illness earlier in the evening and the vomiting may represent other etiology. Due to the episode of choking she will be kept in observation with small trials of oral intake and observing her response to determine whether or not any partial food impaction may remain and subsequent endoscopy will be needed. At this point I do not see symptoms are consistent with a clear food impaction. Of note upon the daughter's arrival the patient's IV had been pulled out resulting in bleeding onto her gown and it was at that time that she had vomited approximately 30 mils of brown tinged material onto her gown. Her daughter was very upset feeling that the patient had not been attended to, this was discussed with nursing who reported every 30 minute checks in the patient had exhibited no signs of distress previously. Upon my recheck at that time the patient showed no signs of respiratory distress or pain, her vital signs are stable, there is no pooling or difficulty handling secretions.     Charlesetta Shanks, MD 10/31/14 2035  Charlesetta Shanks, MD 10/31/14 [phone]

## 2014-10-31 NOTE — Progress Notes (Signed)
Rounded patient. Discovered that patient removed NG tube and peripheral IV.  Will reinsert IV & NG tube, once green safety mitts are obtained.  Jillyn Ledger, MBA, BS, RN

## 2014-11-01 ENCOUNTER — Inpatient Hospital Stay (HOSPITAL_COMMUNITY): Payer: Medicare Other

## 2014-11-01 DIAGNOSIS — K5669 Other intestinal obstruction: Secondary | ICD-10-CM

## 2014-11-01 LAB — COMPREHENSIVE METABOLIC PANEL
ALT: 14 U/L (ref 0–35)
AST: 21 U/L (ref 0–37)
Albumin: 3 g/dL — ABNORMAL LOW (ref 3.5–5.2)
Alkaline Phosphatase: 68 U/L (ref 39–117)
Anion gap: 8 (ref 5–15)
BUN: 17 mg/dL (ref 6–23)
CALCIUM: 8.2 mg/dL — AB (ref 8.4–10.5)
CHLORIDE: 108 mmol/L (ref 96–112)
CO2: 21 mmol/L (ref 19–32)
Creatinine, Ser: 1.03 mg/dL (ref 0.50–1.10)
GFR calc Af Amer: 54 mL/min — ABNORMAL LOW (ref >90)
GFR calc non Af Amer: 47 mL/min — ABNORMAL LOW (ref >90)
Glucose, Bld: 100 mg/dL — ABNORMAL HIGH (ref 70–99)
Potassium: 3.9 mmol/L (ref 3.5–5.1)
Sodium: 137 mmol/L (ref 135–145)
Total Bilirubin: 0.4 mg/dL (ref 0.3–1.2)
Total Protein: 5.6 g/dL — ABNORMAL LOW (ref 6.0–8.3)

## 2014-11-01 LAB — URINE CULTURE
Colony Count: NO GROWTH
Culture: NO GROWTH

## 2014-11-01 LAB — CBC WITH DIFFERENTIAL/PLATELET
Basophils Absolute: 0.1 10*3/uL (ref 0.0–0.1)
Basophils Relative: 1 % (ref 0–1)
Eosinophils Absolute: 0.1 10*3/uL (ref 0.0–0.7)
Eosinophils Relative: 1 % (ref 0–5)
HCT: 35 % — ABNORMAL LOW (ref 36.0–46.0)
Hemoglobin: 11.5 g/dL — ABNORMAL LOW (ref 12.0–15.0)
LYMPHS ABS: 1.1 10*3/uL (ref 0.7–4.0)
Lymphocytes Relative: 18 % (ref 12–46)
MCH: 29.1 pg (ref 26.0–34.0)
MCHC: 32.9 g/dL (ref 30.0–36.0)
MCV: 88.6 fL (ref 78.0–100.0)
MONOS PCT: 9 % (ref 3–12)
Monocytes Absolute: 0.6 10*3/uL (ref 0.1–1.0)
Neutro Abs: 4.4 10*3/uL (ref 1.7–7.7)
Neutrophils Relative %: 71 % (ref 43–77)
PLATELETS: 202 10*3/uL (ref 150–400)
RBC: 3.95 MIL/uL (ref 3.87–5.11)
RDW: 15.4 % (ref 11.5–15.5)
WBC: 6.3 10*3/uL (ref 4.0–10.5)

## 2014-11-01 LAB — GLUCOSE, CAPILLARY
GLUCOSE-CAPILLARY: 100 mg/dL — AB (ref 70–99)
GLUCOSE-CAPILLARY: 82 mg/dL (ref 70–99)
Glucose-Capillary: 110 mg/dL — ABNORMAL HIGH (ref 70–99)
Glucose-Capillary: 127 mg/dL — ABNORMAL HIGH (ref 70–99)
Glucose-Capillary: 85 mg/dL (ref 70–99)

## 2014-11-01 LAB — HEMOGLOBIN A1C
HEMOGLOBIN A1C: 6.3 % — AB (ref 4.8–5.6)
Mean Plasma Glucose: 134 mg/dL

## 2014-11-01 NOTE — Progress Notes (Signed)
Pt tolerated clear liquid diet without difficulty. No N/V at this time. Pt confused.

## 2014-11-01 NOTE — Consult Note (Signed)
[facility] CONSULT Reason for consult: recurrent partial small bowel obstruction Referring Physician: [facility]  Alicia [name] is an 81 y.o. female.  HPI: patient has been admitted several times over the past year for partial small bowel obstruction that has improved with conservative therapy. This is been demonstrated on multiple x-rays and CT scans. She was admitted again with nausea and vomiting. She has improved with MG suction and apparently pulled out her NG tube. She has had previous surgeries that have included abdominal hysterectomy, appendectomy, repair of vesicular vaginal fistula and new for rectum me due to ovarian cancer with subsequent chemo and radiation. Multiple medical problems including dementia depression ovarian cancers noted Parkinson's disease and diabetes. CT scan on admission again showed partial SBO in the distal ileum with marked abrupt change in caliber in diameter of the distal illi area this is similar to previous scans. Contrast from the CT did apparently passed into the: on subsequent films. The patient has been seen by surgery and a CT enterography was considered a concern that she could not drink the contrast due to her dementia and the capsule endoscopy should be performed. Currently the patient is tolerating clear liquids and not vomiting.  Past Medical History  Diagnosis Date  . Dementia     significant  . Renal disease   . Memory loss 12/10/2012  . Depression 12/10/2012  . Anxiety state, unspecified 12/10/2012  . Spells 12/10/2012  . Essential hypertension, benign 12/10/2012  . Other and unspecified hyperlipidemia 12/10/2012  . Loss of weight 12/10/2012  . Carotid artery stenosis   . Ovarian cancer     treated with surgery & chemo  . Ankle fracture, right   . Unstable gait     mild  . Syncope 12/10/2012    Recurrent, sporadic episodes  . Carotid artery disease   . Parkinson disease   . Diabetes mellitus without complication   .  Diverticulosis     Past Surgical History  Procedure Laterality Date  . Vesicovaginal fistula closure w/ tah  2005  . Oophorectomy  2007    for ovarian cancer  . Prolapsed uterine fibroid ligation    . Appendectomy    . Cataract extraction Bilateral   . Implantable loop recorder  11/14    MDT LINQ implanted for recurrent unexplained syncope by Dr Rayann Heman  . Abdominal hysterectomy    . Ankle surgery Left   . Loop recorder implant N/A 07/17/2013    Procedure: LOOP RECORDER IMPLANT;  Surgeon: Coralyn Mark, MD;  Location: [facility];  Service: Cardiovascular;  Laterality: N/A;    Family History  Problem Relation Age of Onset  . Cancer Mother     breast  . Stroke Father     Social History:  reports that she has never smoked. She has never used smokeless tobacco. She reports that she does not drink alcohol or use illicit drugs.  Allergies:  Allergies  Allergen Reactions  . Iohexol Rash  . Ativan [Lorazepam] Other (See Comments)    Causes agitation  . Ciprofloxacin Other (See Comments)    Weakness (possible reaction)  . Keflex [Cephalexin] Other (See Comments)    Weakness (possible reaction)  . Nsaids Other (See Comments)    Renal insufficiency  . Sulfa Antibiotics Hives and Itching  . Ivp Dye [Iodinated Diagnostic Agents] Rash    Medications; Prior to Admission medications   Medication Sig Start Date End Date Taking? Authorizing Provider  Albuterol Sulfate (PROAIR RESPICLICK) 382 (90 BASE)  MCG/ACT AEPB Inhale 2 puffs into the lungs every 4 (four) hours as needed (for shortness of breath).  05/08/14  Yes Historical Provider, MD  amLODipine (NORVASC) 2.5 MG tablet Take 2.5 mg by mouth daily.   Yes Historical Provider, MD  aspirin EC 81 MG tablet Take 81 mg by mouth daily.   Yes Historical Provider, MD  atorvastatin (LIPITOR) 20 MG tablet Take 20 mg by mouth at bedtime.    Yes Historical Provider, MD  Calcium Carbonate-Vitamin D (CALTRATE 600+D PO) Take 600 mg by mouth at  bedtime.   Yes Historical Provider, MD  docusate sodium (COLACE) 100 MG capsule Take 100 mg by mouth 2 (two) times daily.   Yes Historical Provider, MD  loratadine (CLARITIN) 10 MG tablet Take 10 mg by mouth daily.  11/30/13  Yes Historical Provider, MD  Multiple Vitamin (MULTIVITAMIN WITH MINERALS) TABS Take 1 tablet by mouth daily. Centrum Silver   Yes Historical Provider, MD  nitrofurantoin, macrocrystal-monohydrate, (MACROBID) 100 MG capsule Take 100 mg by mouth at bedtime. Continuous course (hold while on antibiotics)   Yes Historical Provider, MD  sertraline (ZOLOFT) 50 MG tablet Take 50 mg by mouth daily.   Yes Historical Provider, MD  memantine (NAMENDA XR) 14 MG CP24 24 hr capsule Take 1 capsule (14 mg total) by mouth daily. Patient taking differently: Take 14 mg by mouth daily. Take for 7 days, then take 21 mg xr for 7 days, then take 28 mg xr daily until stopped 10/30/14   Star Age, MD  memantine (NAMENDA XR) 28 MG CP24 24 hr capsule Take 1 capsule (28 mg total) by mouth daily. Patient taking differently: Take 28 mg by mouth daily. Take 14 mg xr for 7 days, then take 21 mg xr for 7 days, then take 28 mg xr daily until stopped 10/30/14   Star Age, MD  Memantine HCl ER (NAMENDA XR) 21 MG CP24 Take 21 mg by mouth daily. Patient taking differently: Take 21 mg by mouth daily. Start after taking 14 mg xr for 7 days, take 21 mg xr for 7 days, then take 28 mg xr daily until stopped 10/30/14   Star Age, MD   . heparin  5,000 Units Subcutaneous 3 times per day  . insulin aspart  0-9 Units Subcutaneous 6 times per day   PRN Meds ondansetron **OR** ondansetron (ZOFRAN) IV Results for orders placed or performed during the hospital encounter of 10/30/14 (from the past 48 hour(s))  CBC     Status: Abnormal   Collection Time: 10/30/14  9:37 PM  Result Value Ref Range   WBC 12.6 (H) 4.0 - 10.5 K/uL   RBC 4.48 3.87 - 5.11 MIL/uL   Hemoglobin 12.9 12.0 - 15.0 g/dL   HCT 39.3 36.0 - 46.0 %    MCV 87.7 78.0 - 100.0 fL   MCH 28.8 26.0 - 34.0 pg   MCHC 32.8 30.0 - 36.0 g/dL   RDW 14.9 11.5 - 15.5 %   Platelets 248 150 - 400 K/uL  Basic metabolic panel     Status: Abnormal   Collection Time: 10/30/14  9:37 PM  Result Value Ref Range   Sodium 135 135 - 145 mmol/L   Potassium 4.2 3.5 - 5.1 mmol/L   Chloride 103 96 - 112 mmol/L   CO2 23 19 - 32 mmol/L   Glucose, Bld 181 (H) 70 - 99 mg/dL   BUN 21 6 - 23 mg/dL   Creatinine, Ser 1.32 (H) 0.50 - 1.10  mg/dL   Calcium 9.2 8.4 - 10.5 mg/dL   GFR calc non Af Amer 35 (L) >90 mL/min   GFR calc Af Amer 40 (L) >90 mL/min    Comment: (NOTE) The eGFR has been calculated using the CKD EPI equation. This calculation has not been validated in all clinical situations. eGFR's persistently <90 mL/min signify possible Chronic Kidney Disease.    Anion gap 9 5 - 15  BNP (order ONLY if patient complains of dyspnea/SOB AND you have documented it for THIS visit)     Status: None   Collection Time: 10/30/14  9:37 PM  Result Value Ref Range   B Natriuretic Peptide 94.0 0.0 - 100.0 pg/mL  Hepatic function panel     Status: Abnormal   Collection Time: 10/30/14  9:37 PM  Result Value Ref Range   Total Protein 6.8 6.0 - 8.3 g/dL   Albumin 3.7 3.5 - 5.2 g/dL   AST 23 0 - 37 U/L   ALT 17 0 - 35 U/L   Alkaline Phosphatase 80 39 - 117 U/L   Total Bilirubin 0.2 (L) 0.3 - 1.2 mg/dL   Bilirubin, Direct <0.1 0.0 - 0.5 mg/dL   Indirect Bilirubin NOT CALCULATED 0.3 - 0.9 mg/dL  Lipase, blood     Status: None   Collection Time: 10/30/14  9:37 PM  Result Value Ref Range   Lipase 36 11 - 59 U/L  I-stat troponin, ED (not at [facility]     Status: None   Collection Time: 10/30/14  9:43 PM  Result Value Ref Range   Troponin i, poc 0.01 0.00 - 0.08 ng/mL   Comment 3            Comment: Due to the release kinetics of cTnI, a negative result within the first hours of the onset of symptoms does not rule out myocardial infarction with certainty. If myocardial  infarction is still suspected, repeat the test at appropriate intervals.   CBC     Status: Abnormal   Collection Time: 10/31/14  2:21 AM  Result Value Ref Range   WBC 13.8 (H) 4.0 - 10.5 K/uL   RBC 4.66 3.87 - 5.11 MIL/uL   Hemoglobin 13.6 12.0 - 15.0 g/dL   HCT 41.0 36.0 - 46.0 %   MCV 88.0 78.0 - 100.0 fL   MCH 29.2 26.0 - 34.0 pg   MCHC 33.2 30.0 - 36.0 g/dL   RDW 15.1 11.5 - 15.5 %   Platelets 241 150 - 400 K/uL  Creatinine, serum     Status: Abnormal   Collection Time: 10/31/14  2:21 AM  Result Value Ref Range   Creatinine, Ser 1.43 (H) 0.50 - 1.10 mg/dL   GFR calc non Af Amer 31 (L) >90 mL/min   GFR calc Af Amer 36 (L) >90 mL/min    Comment: (NOTE) The eGFR has been calculated using the CKD EPI equation. This calculation has not been validated in all clinical situations. eGFR's persistently <90 mL/min signify possible Chronic Kidney Disease.   Hemoglobin A1c     Status: Abnormal   Collection Time: 10/31/14  2:21 AM  Result Value Ref Range   Hgb A1c MFr Bld 6.3 (H) 4.8 - 5.6 %    Comment: (NOTE)         Pre-diabetes: 5.7 - 6.4         Diabetes: >6.4         Glycemic control for adults with diabetes: <7.0    Mean Plasma Glucose  134 mg/dL    Comment: (NOTE) Performed At: [facility] [place], [place] [id number] Lindon Romp MD IR:[id number]   Glucose, capillary     Status: Abnormal   Collection Time: 10/31/14  2:31 AM  Result Value Ref Range   Glucose-Capillary 178 (H) 70 - 99 mg/dL  Glucose, capillary     Status: Abnormal   Collection Time: 10/31/14  2:53 AM  Result Value Ref Range   Glucose-Capillary 182 (H) 70 - 99 mg/dL  Comprehensive metabolic panel     Status: Abnormal   Collection Time: 10/31/14  5:40 AM  Result Value Ref Range   Sodium 136 135 - 145 mmol/L   Potassium 4.1 3.5 - 5.1 mmol/L   Chloride 101 96 - 112 mmol/L   CO2 29 19 - 32 mmol/L   Glucose, Bld 168 (H) 70 - 99 mg/dL   BUN 21 6 - 23 mg/dL   Creatinine, Ser  1.42 (H) 0.50 - 1.10 mg/dL   Calcium 8.7 8.4 - 10.5 mg/dL   Total Protein 6.0 6.0 - 8.3 g/dL   Albumin 3.1 (L) 3.5 - 5.2 g/dL   AST 22 0 - 37 U/L   ALT 15 0 - 35 U/L   Alkaline Phosphatase 70 39 - 117 U/L   Total Bilirubin 0.4 0.3 - 1.2 mg/dL   GFR calc non Af Amer 32 (L) >90 mL/min   GFR calc Af Amer 37 (L) >90 mL/min    Comment: (NOTE) The eGFR has been calculated using the CKD EPI equation. This calculation has not been validated in all clinical situations. eGFR's persistently <90 mL/min signify possible Chronic Kidney Disease.    Anion gap 6 5 - 15  CBC WITH DIFFERENTIAL     Status: Abnormal   Collection Time: 10/31/14  5:40 AM  Result Value Ref Range   WBC 9.7 4.0 - 10.5 K/uL   RBC 4.05 3.87 - 5.11 MIL/uL   Hemoglobin 11.6 (L) 12.0 - 15.0 g/dL   HCT 35.9 (L) 36.0 - 46.0 %   MCV 88.6 78.0 - 100.0 fL   MCH 28.6 26.0 - 34.0 pg   MCHC 32.3 30.0 - 36.0 g/dL   RDW 15.0 11.5 - 15.5 %   Platelets 222 150 - 400 K/uL   Neutrophils Relative % 87 (H) 43 - 77 %   Neutro Abs 8.5 (H) 1.7 - 7.7 K/uL   Lymphocytes Relative 9 (L) 12 - 46 %   Lymphs Abs 0.8 0.7 - 4.0 K/uL   Monocytes Relative 4 3 - 12 %   Monocytes Absolute 0.4 0.1 - 1.0 K/uL   Eosinophils Relative 0 0 - 5 %   Eosinophils Absolute 0.0 0.0 - 0.7 K/uL   Basophils Relative 0 0 - 1 %   Basophils Absolute 0.0 0.0 - 0.1 K/uL  Glucose, capillary     Status: Abnormal   Collection Time: 10/31/14  7:51 AM  Result Value Ref Range   Glucose-Capillary 186 (H) 70 - 99 mg/dL  Urinalysis, Routine w reflex microscopic     Status: None   Collection Time: 10/31/14 10:54 AM  Result Value Ref Range   Color, Urine YELLOW YELLOW   APPearance CLEAR CLEAR   Specific Gravity, Urine 1.026 1.005 - 1.030   pH 5.0 5.0 - 8.0   Glucose, UA NEGATIVE NEGATIVE mg/dL   Hgb urine dipstick NEGATIVE NEGATIVE   Bilirubin Urine NEGATIVE NEGATIVE   Ketones, ur NEGATIVE NEGATIVE mg/dL   Protein, ur NEGATIVE NEGATIVE  mg/dL   Urobilinogen, UA 0.2 0.0 -  1.0 mg/dL   Nitrite NEGATIVE NEGATIVE   Leukocytes, UA NEGATIVE NEGATIVE    Comment: MICROSCOPIC NOT DONE ON URINES WITH NEGATIVE PROTEIN, BLOOD, LEUKOCYTES, NITRITE, OR GLUCOSE <1000 mg/dL.  Glucose, capillary     Status: Abnormal   Collection Time: 10/31/14 10:59 AM  Result Value Ref Range   Glucose-Capillary 122 (H) 70 - 99 mg/dL  Glucose, capillary     Status: Abnormal   Collection Time: 10/31/14  4:13 PM  Result Value Ref Range   Glucose-Capillary 165 (H) 70 - 99 mg/dL  Glucose, capillary     Status: Abnormal   Collection Time: 10/31/14  8:44 PM  Result Value Ref Range   Glucose-Capillary 152 (H) 70 - 99 mg/dL   Comment 1 Notify RN    Comment 2 Documented in Char   Glucose, capillary     Status: Abnormal   Collection Time: 11/01/14 12:13 AM  Result Value Ref Range   Glucose-Capillary 127 (H) 70 - 99 mg/dL  Glucose, capillary     Status: Abnormal   Collection Time: 11/01/14  4:00 AM  Result Value Ref Range   Glucose-Capillary 100 (H) 70 - 99 mg/dL  Comprehensive metabolic panel     Status: Abnormal   Collection Time: 11/01/14  9:07 AM  Result Value Ref Range   Sodium 137 135 - 145 mmol/L   Potassium 3.9 3.5 - 5.1 mmol/L   Chloride 108 96 - 112 mmol/L   CO2 21 19 - 32 mmol/L   Glucose, Bld 100 (H) 70 - 99 mg/dL   BUN 17 6 - 23 mg/dL   Creatinine, Ser 1.03 0.50 - 1.10 mg/dL   Calcium 8.2 (L) 8.4 - 10.5 mg/dL   Total Protein 5.6 (L) 6.0 - 8.3 g/dL   Albumin 3.0 (L) 3.5 - 5.2 g/dL   AST 21 0 - 37 U/L   ALT 14 0 - 35 U/L   Alkaline Phosphatase 68 39 - 117 U/L   Total Bilirubin 0.4 0.3 - 1.2 mg/dL   GFR calc non Af Amer 47 (L) >90 mL/min   GFR calc Af Amer 54 (L) >90 mL/min    Comment: (NOTE) The eGFR has been calculated using the CKD EPI equation. This calculation has not been validated in all clinical situations. eGFR's persistently <90 mL/min signify possible Chronic Kidney Disease.    Anion gap 8 5 - 15  CBC WITH DIFFERENTIAL     Status: Abnormal   Collection  Time: 11/01/14  9:07 AM  Result Value Ref Range   WBC 6.3 4.0 - 10.5 K/uL   RBC 3.95 3.87 - 5.11 MIL/uL   Hemoglobin 11.5 (L) 12.0 - 15.0 g/dL   HCT 35.0 (L) 36.0 - 46.0 %   MCV 88.6 78.0 - 100.0 fL   MCH 29.1 26.0 - 34.0 pg   MCHC 32.9 30.0 - 36.0 g/dL   RDW 15.4 11.5 - 15.5 %   Platelets 202 150 - 400 K/uL   Neutrophils Relative % 71 43 - 77 %   Neutro Abs 4.4 1.7 - 7.7 K/uL   Lymphocytes Relative 18 12 - 46 %   Lymphs Abs 1.1 0.7 - 4.0 K/uL   Monocytes Relative 9 3 - 12 %   Monocytes Absolute 0.6 0.1 - 1.0 K/uL   Eosinophils Relative 1 0 - 5 %   Eosinophils Absolute 0.1 0.0 - 0.7 K/uL   Basophils Relative 1 0 - 1 %   Basophils  Absolute 0.1 0.0 - 0.1 K/uL  Glucose, capillary     Status: None   Collection Time: 11/01/14 12:32 PM  Result Value Ref Range   Glucose-Capillary 82 70 - 99 mg/dL    Ct Abdomen Pelvis Wo Contrast  10/31/2014   CLINICAL DATA:  Abdominal pain. History of small bowel obstructions.  EXAM: CT ABDOMEN AND PELVIS WITHOUT CONTRAST  TECHNIQUE: Multidetector CT imaging of the abdomen and pelvis was performed following the standard protocol without IV contrast.  COMPARISON:  CT 09/18/2014, plain films 10/31/2014  FINDINGS: Lower chest: Mild atelectasis at the lung bases is increased compared to prior. Right lower lobe atelectasis is more dense than the left.  Hepatobiliary: Non IV contrast images demonstrate no focal hepatic lesion. The gallbladder is normal volume.  Pancreas: Pancreas is normal. No ductal dilatation. No pancreatic inflammation.  Spleen: Normal spleen.  Adrenals/urinary tract: Adrenal glands are normal. No nephrolithiasis or ureterolithiasis. No obstructive uropathy.  Stomach/Bowel: The stomach is distended. There is a large hiatal hernia. The majority of the oral contrast remains within the stomach. The duodenum is normal caliber. The proximal jejunum is normal caliber. The distal ileum is dilated over a long segment involving the near entirety of the  ileum. Segments are dilated up to 3.2 cm. There are several air-fluid levels. There is a smooth transition to normal caliber distal ileum leading up to the terminal ileum seen on images 44 through 52 of the axial series and clearly demonstrated on images 35 through 46 of the coronal data set. The distal ileum leading up to terminal ileum measures 15 mm while the ileum proximal to this transition Mmeasures 33 mm. This pattern of caliber change is very similar to comparison CT of 09/18/2014. No clear evidence of adhesions. Potential endoluminal lesion on coronal image 35, series 202) and axial image 45. There is no pneumatosis or portal venous gas. No free fluid in the head or pelvis.  The ascending, transverse, and descending colon relatively collapsed. There is small amount of stool in the rectum. There scattered diverticula throughout the colon without acute inflammation.  Vascular/Lymphatic: Abdominal aorta is normal caliber with atherosclerotic calcification. There is no retroperitoneal or periportal lymphadenopathy. No pelvic lymphadenopathy.  Reproductive: Post hysterectomy  Musculoskeletal: No aggressive osseous lesion.  IMPRESSION: 1. Chronic partial mechanical small bowel obstruction with caliber change in the distal ileum approximately 8 cm from the terminal ileum. Potential endoluminal lesion identified but poorly characterized without IV contrast. Recommend CT enterography or capsule optical evaluation. 2. Colonic diverticulosis without acute diverticulitis. 3. Increasing basilar atelectasis more dense on the right. These results will be called to the ordering clinician or representative by the Radiologist Assistant, and communication documented in the PACS or zVision Dashboard.   Electronically Signed   By: Suzy Bouchard M.D.   On: 10/31/2014 12:34   Dg Chest 2 View  10/30/2014   CLINICAL DATA:  Chest pain post choking episode while eating a pulled pork sandwhich, was able to cough it up, single  episode of vomiting follow by substernal chest pain  EXAM: CHEST  2 VIEW  COMPARISON:  10/17/2014  FINDINGS: Loop recorder projects over cardiac silhouette.  Upper normal heart size.  Normal mediastinal contours and pulmonary vascularity.  Atherosclerotic calcification aorta.  Minimal bibasilar atelectasis.  Lungs otherwise clear.  No pleural effusion or pneumothorax.  Bones demineralized.  IMPRESSION: Minimal bibasilar atelectasis.   Electronically Signed   By: Lavonia Dana M.D.   On: 10/30/2014 22:21   Dg Abd 1  View  10/31/2014   CLINICAL DATA:  Emesis  EXAM: ABDOMEN - 1 VIEW  COMPARISON:  None.  FINDINGS: The bowel gas pattern is normal. No radio-opaque calculi or other significant radiographic abnormality are seen.  IMPRESSION: Negative.   Electronically Signed   By: Andreas Newport M.D.   On: 10/31/2014 01:51   Dg Abd 2 Views  11/01/2014   CLINICAL DATA:  81 year old female with a history of small bowel obstruction.  EXAM: ABDOMEN - 2 VIEW  COMPARISON:  None.  FINDINGS: Enteric contrast, present on the prior CT, has traversed the colon. No evidence of colonic distention. Multiple colonic diverticula. Small central small bowel gas, measures borderline enlarged.  No definite free air.  Multiple surgical clips within the abdomen  No unexpected calcifications.  No displaced fracture.  IMPRESSION: Enteric contrast has a traversed the GI system from the stomach through the colon on today's study, with no indication of a complete small bowel obstruction.  Diverticular disease.  Surgical clips within the abdomen.   Electronically Signed   By: Corrie Mckusick D.O.   On: 11/01/2014 08:03               Blood pressure 143/48, pulse 71, temperature 97.6 F (36.4 C), temperature source Oral, resp. rate 17, height 5' 2"  (1.575 m), weight 62.687 kg (138 lb 3.2 oz), SpO2 100 %.  Physical exam:   General--frail elderly white female with both hands bound with protectors   Heart--regular rate and rhythm  without murmurs are gallops  Lungs--clear  Abdomen--nondistended, nontender with pretty good bowel sounds  Psych- completely confused  Assessment: 1. Chronic partial SBO. Abrupt change in caliber the bowel and the distal ileum with the question of endoluminal mass on CT scan. This is likely due to adhesions from prior surgeries and radiation although other causes are possible. 2. Chronic dementia 3. Status post hysterectomy, appendectomy, oophorectomy due to ovarian cancer with subsequent chemo and radiation.  Plan:  1. Patient currently is better. It appears that there is a significant blockage in the distal ileum. She always seems to get better with conservative therapy. I'm quite has in about capsule endoscopy since it's very possible lumen is quite narrowed and the metal capsule may become lodged here and not pass. This would leave no other option than an operation. I would favor attempting the CT enterography or small bowel follow-through possibly with Gastrograffin which could potentially be sucked out to try to define this area better.   Alicia [name],Dekari Bures L 11/01/2014, 1:48 PM

## 2014-11-01 NOTE — Progress Notes (Signed)
Woke pt to give medication. Pt took sips of ginger ale without difficulty.  Pt pleasant and alert upon waking.

## 2014-11-01 NOTE — Progress Notes (Signed)
Pt removed NG tube yesterday. Pt has not had any episodes of vomiting since then. New NG tube not placed to decrease pt distress. Will continue to monitor.   Shelbie Hutching, RN

## 2014-11-01 NOTE — Progress Notes (Signed)
Pt pulled out IV paged IV team.

## 2014-11-01 NOTE — Progress Notes (Signed)
Paged Alicia [name], [facility].

## 2014-11-01 NOTE — Progress Notes (Signed)
Pt pulled out NG tube yesterday, no N/V since, paged Dr. Carles Collet does he want to leave out.

## 2014-11-01 NOTE — Progress Notes (Signed)
Patient ID: Alicia [name], female   DOB: May 28, 1923, 81 y.o.   MRN: [id number]    Subjective: Pt feels better today.  No further emesis with NGT out and no further abdominal complaints.  Unsure of BMs  Objective: Vital signs in last 24 hours: Temp:  [97.6 F (36.4 C)-98.4 F (36.9 C)] 97.6 F (36.4 C) (02/20 1028) Pulse Rate:  [71-97] 71 (02/20 1028) Resp:  [17-19] 17 (02/20 0413) BP: (132-159)/(48-113) 143/48 mmHg (02/20 1028) SpO2:  [95 %-100 %] 100 % (02/20 1028)    Intake/Output from previous day:   Intake/Output this shift:    PE: Abd: soft, less tender, +BS  Lab Results:   Recent Labs  10/31/14 0540 11/01/14 0907  WBC 9.7 6.3  HGB 11.6* 11.5*  HCT 35.9* 35.0*  PLT 222 202   BMET  Recent Labs  10/30/14 2137 10/31/14 0221 10/31/14 0540  NA 135  --  136  K 4.2  --  4.1  CL 103  --  101  CO2 23  --  29  GLUCOSE 181*  --  168*  BUN 21  --  21  CREATININE 1.32* 1.43* 1.42*  CALCIUM 9.2  --  8.7   PT/INR No results for input(s): LABPROT, INR in the last 72 hours. CMP     Component Value Date/Time   NA 136 10/31/2014 0540   K 4.1 10/31/2014 0540   CL 101 10/31/2014 0540   CO2 29 10/31/2014 0540   GLUCOSE 168* 10/31/2014 0540   BUN 21 10/31/2014 0540   CREATININE 1.42* 10/31/2014 0540   CALCIUM 8.7 10/31/2014 0540   PROT 6.0 10/31/2014 0540   ALBUMIN 3.1* 10/31/2014 0540   AST 22 10/31/2014 0540   ALT 15 10/31/2014 0540   ALKPHOS 70 10/31/2014 0540   BILITOT 0.4 10/31/2014 0540   GFRNONAA 32* 10/31/2014 0540   GFRAA 37* 10/31/2014 0540   Lipase     Component Value Date/Time   LIPASE 36 10/30/2014 2137       Studies/Results: Ct Abdomen Pelvis Wo Contrast  10/31/2014   CLINICAL DATA:  Abdominal pain. History of small bowel obstructions.  EXAM: CT ABDOMEN AND PELVIS WITHOUT CONTRAST  TECHNIQUE: Multidetector CT imaging of the abdomen and pelvis was performed following the standard protocol without IV contrast.  COMPARISON:  CT  09/18/2014, plain films 10/31/2014  FINDINGS: Lower chest: Mild atelectasis at the lung bases is increased compared to prior. Right lower lobe atelectasis is more dense than the left.  Hepatobiliary: Non IV contrast images demonstrate no focal hepatic lesion. The gallbladder is normal volume.  Pancreas: Pancreas is normal. No ductal dilatation. No pancreatic inflammation.  Spleen: Normal spleen.  Adrenals/urinary tract: Adrenal glands are normal. No nephrolithiasis or ureterolithiasis. No obstructive uropathy.  Stomach/Bowel: The stomach is distended. There is a large hiatal hernia. The majority of the oral contrast remains within the stomach. The duodenum is normal caliber. The proximal jejunum is normal caliber. The distal ileum is dilated over a long segment involving the near entirety of the ileum. Segments are dilated up to 3.2 cm. There are several air-fluid levels. There is a smooth transition to normal caliber distal ileum leading up to the terminal ileum seen on images 44 through 52 of the axial series and clearly demonstrated on images 35 through 46 of the coronal data set. The distal ileum leading up to terminal ileum measures 15 mm while the ileum proximal to this transition Mmeasures 33 mm. This pattern of caliber change is  very similar to comparison CT of 09/18/2014. No clear evidence of adhesions. Potential endoluminal lesion on coronal image 35, series 202) and axial image 45. There is no pneumatosis or portal venous gas. No free fluid in the head or pelvis.  The ascending, transverse, and descending colon relatively collapsed. There is small amount of stool in the rectum. There scattered diverticula throughout the colon without acute inflammation.  Vascular/Lymphatic: Abdominal aorta is normal caliber with atherosclerotic calcification. There is no retroperitoneal or periportal lymphadenopathy. No pelvic lymphadenopathy.  Reproductive: Post hysterectomy  Musculoskeletal: No aggressive osseous  lesion.  IMPRESSION: 1. Chronic partial mechanical small bowel obstruction with caliber change in the distal ileum approximately 8 cm from the terminal ileum. Potential endoluminal lesion identified but poorly characterized without IV contrast. Recommend CT enterography or capsule optical evaluation. 2. Colonic diverticulosis without acute diverticulitis. 3. Increasing basilar atelectasis more dense on the right. These results will be called to the ordering clinician or representative by the Radiologist Assistant, and communication documented in the PACS or zVision Dashboard.   Electronically Signed   By: Suzy Bouchard M.D.   On: 10/31/2014 12:34   Dg Chest 2 View  10/30/2014   CLINICAL DATA:  Chest pain post choking episode while eating a pulled pork sandwhich, was able to cough it up, single episode of vomiting follow by substernal chest pain  EXAM: CHEST  2 VIEW  COMPARISON:  10/17/2014  FINDINGS: Loop recorder projects over cardiac silhouette.  Upper normal heart size.  Normal mediastinal contours and pulmonary vascularity.  Atherosclerotic calcification aorta.  Minimal bibasilar atelectasis.  Lungs otherwise clear.  No pleural effusion or pneumothorax.  Bones demineralized.  IMPRESSION: Minimal bibasilar atelectasis.   Electronically Signed   By: Lavonia Dana M.D.   On: 10/30/2014 22:21   Dg Abd 1 View  10/31/2014   CLINICAL DATA:  Emesis  EXAM: ABDOMEN - 1 VIEW  COMPARISON:  None.  FINDINGS: The bowel gas pattern is normal. No radio-opaque calculi or other significant radiographic abnormality are seen.  IMPRESSION: Negative.   Electronically Signed   By: Andreas Newport M.D.   On: 10/31/2014 01:51   Dg Abd 2 Views  11/01/2014   CLINICAL DATA:  81 year old female with a history of small bowel obstruction.  EXAM: ABDOMEN - 2 VIEW  COMPARISON:  None.  FINDINGS: Enteric contrast, present on the prior CT, has traversed the colon. No evidence of colonic distention. Multiple colonic diverticula. Small  central small bowel gas, measures borderline enlarged.  No definite free air.  Multiple surgical clips within the abdomen  No unexpected calcifications.  No displaced fracture.  IMPRESSION: Enteric contrast has a traversed the GI system from the stomach through the colon on today's study, with no indication of a complete small bowel obstruction.  Diverticular disease.  Surgical clips within the abdomen.   Electronically Signed   By: Corrie Mckusick D.O.   On: 11/01/2014 08:03    Anti-infectives: Anti-infectives    None       Assessment/Plan  1. Chronic PSBO, ? Endoluminal mass 2. Dementia 3. Parkinson's disease  Plan: 1. I had a long discussion with the patient's daughter in law.  We will not replace her NGT as she has no further nausea and contrast in her colon on her plain films.  I will order a CT enterography today to evaluate for a small bowel mass that may be contributing to her chronic PSBO.  The DIL is agreeable to this.  She would want further  discussions with her husband if surgery were needed to be considered, given her recurrent admissions for this problem.  We will follow along and await CTE.   ADDENDUM: CT states that CTE without IV contrast may not be helpful.  Unsure if patient can fully drink 3 cups of contrast in an hour.  Since her films look better today, will give clear liquids and revisit discussion of trying to pursue CTE vs involving GI for capsule endoscopy.  LOS: 1 day    Ninoska Goswick E 11/01/2014, 11:08 AM Pager: [phone]

## 2014-11-01 NOTE — Progress Notes (Addendum)
PROGRESS NOTE  Alicia [name] WUG:[id number] DOB: March 28, 1923 DOA: 10/30/2014 PCP: Shellia Carwin, PA-C  Brief History 81 year old female with a history of dementia, parkinsonism, recurrent small bowel obstruction, ovarian cancer, hypertension, PSVT, and diet-controlled diabetes mellitus presents with one-day history of vomiting and abdominal pain. The patient is unable to provide any history due to her advanced dementia. History is obtained from the patient's daughter-in-law, Alicia [name]. Apparently, the patient has been in her usual state of health until 10/30/2014 when she ate a pulled pork sandwich. She began choking on it and then ended up having vomiting. The patient was brought to the emergency department where she was given clear liquids. She ended up having more vomiting. There's been no reports of fevers, chills, chest pain, shortness breath, diarrhea, abdominal pain, dysuria.  Assessment/Plan: Intractable vomiting/abdominal pain-->chronic PSBO -Patient has had a history of recurrent SBO in the past-this is her third admit in 6 months for similar presentation -CT abdomen and pelvis with oral contrast only-->PSBO with question of endoluminal mass -2/20 AXR--some improvement in PSBO -Certainly, the patient's large hiatus hernia may be contributing to her vomiting -Continue IV fluids -appreciate general surgery followup -consult GI as not clear if pt can tolerate CT enterography and renal function may limit use of contrast although serum creatinine is improving with IVF--spoke with Dr. Oletta Lamas -pt also has rash to IV dye so she would need prep with prednisone if IV contrast is given Hypertension -Remain off antihypertensive medications at this time -Monitor blood pressure which has remained stable Diabetes mellitus type 2 -Diet controlled -Hemoglobin A1c--6.3 -Given the patient's age and comorbidities, allow for liberal glycemic control CKD stage III -Baseline  creatinine 1.1-1.4 Dementia -restart Namenda when able to take po HLD -hold atorvastatin for now -restart when able to take po  Family Communication: Daughter in law--Sue updated on phone Disposition Plan: Home when medically stable    Procedures/Studies: Ct Abdomen Pelvis Wo Contrast  10/31/2014   CLINICAL DATA:  Abdominal pain. History of small bowel obstructions.  EXAM: CT ABDOMEN AND PELVIS WITHOUT CONTRAST  TECHNIQUE: Multidetector CT imaging of the abdomen and pelvis was performed following the standard protocol without IV contrast.  COMPARISON:  CT 09/18/2014, plain films 10/31/2014  FINDINGS: Lower chest: Mild atelectasis at the lung bases is increased compared to prior. Right lower lobe atelectasis is more dense than the left.  Hepatobiliary: Non IV contrast images demonstrate no focal hepatic lesion. The gallbladder is normal volume.  Pancreas: Pancreas is normal. No ductal dilatation. No pancreatic inflammation.  Spleen: Normal spleen.  Adrenals/urinary tract: Adrenal glands are normal. No nephrolithiasis or ureterolithiasis. No obstructive uropathy.  Stomach/Bowel: The stomach is distended. There is a large hiatal hernia. The majority of the oral contrast remains within the stomach. The duodenum is normal caliber. The proximal jejunum is normal caliber. The distal ileum is dilated over a long segment involving the near entirety of the ileum. Segments are dilated up to 3.2 cm. There are several air-fluid levels. There is a smooth transition to normal caliber distal ileum leading up to the terminal ileum seen on images 44 through 52 of the axial series and clearly demonstrated on images 35 through 46 of the coronal data set. The distal ileum leading up to terminal ileum measures 15 mm while the ileum proximal to this transition Mmeasures 33 mm. This pattern of caliber change is very similar to comparison CT of 09/18/2014. No clear evidence of adhesions. Potential  endoluminal lesion on  coronal image 35, series 202) and axial image 45. There is no pneumatosis or portal venous gas. No free fluid in the head or pelvis.  The ascending, transverse, and descending colon relatively collapsed. There is small amount of stool in the rectum. There scattered diverticula throughout the colon without acute inflammation.  Vascular/Lymphatic: Abdominal aorta is normal caliber with atherosclerotic calcification. There is no retroperitoneal or periportal lymphadenopathy. No pelvic lymphadenopathy.  Reproductive: Post hysterectomy  Musculoskeletal: No aggressive osseous lesion.  IMPRESSION: 1. Chronic partial mechanical small bowel obstruction with caliber change in the distal ileum approximately 8 cm from the terminal ileum. Potential endoluminal lesion identified but poorly characterized without IV contrast. Recommend CT enterography or capsule optical evaluation. 2. Colonic diverticulosis without acute diverticulitis. 3. Increasing basilar atelectasis more dense on the right. These results will be called to the ordering clinician or representative by the Radiologist Assistant, and communication documented in the PACS or zVision Dashboard.   Electronically Signed   By: Suzy Bouchard M.D.   On: 10/31/2014 12:34   Dg Chest 2 View  10/30/2014   CLINICAL DATA:  Chest pain post choking episode while eating a pulled pork sandwhich, was able to cough it up, single episode of vomiting follow by substernal chest pain  EXAM: CHEST  2 VIEW  COMPARISON:  10/17/2014  FINDINGS: Loop recorder projects over cardiac silhouette.  Upper normal heart size.  Normal mediastinal contours and pulmonary vascularity.  Atherosclerotic calcification aorta.  Minimal bibasilar atelectasis.  Lungs otherwise clear.  No pleural effusion or pneumothorax.  Bones demineralized.  IMPRESSION: Minimal bibasilar atelectasis.   Electronically Signed   By: Lavonia Dana M.D.   On: 10/30/2014 22:21   Dg Abd 1 View  10/31/2014   CLINICAL DATA:   Emesis  EXAM: ABDOMEN - 1 VIEW  COMPARISON:  None.  FINDINGS: The bowel gas pattern is normal. No radio-opaque calculi or other significant radiographic abnormality are seen.  IMPRESSION: Negative.   Electronically Signed   By: Andreas Newport M.D.   On: 10/31/2014 01:51   Dg Abd 2 Views  11/01/2014   CLINICAL DATA:  81 year old female with a history of small bowel obstruction.  EXAM: ABDOMEN - 2 VIEW  COMPARISON:  None.  FINDINGS: Enteric contrast, present on the prior CT, has traversed the colon. No evidence of colonic distention. Multiple colonic diverticula. Small central small bowel gas, measures borderline enlarged.  No definite free air.  Multiple surgical clips within the abdomen  No unexpected calcifications.  No displaced fracture.  IMPRESSION: Enteric contrast has a traversed the GI system from the stomach through the colon on today's study, with no indication of a complete small bowel obstruction.  Diverticular disease.  Surgical clips within the abdomen.   Electronically Signed   By: Corrie Mckusick D.O.   On: 11/01/2014 08:03         Subjective: Patient pulled out her NG tube last evening. No vomiting. No reports of respiratory distress, uncontrolled pain, diarrhea, fevers, chills.  Objective: Filed Vitals:   10/31/14 1720 10/31/14 2145 11/01/14 0413 11/01/14 1028  BP: 132/113 159/69 152/52 143/48  Pulse: 91 97 73 71  Temp: 98.1 F (36.7 C) 97.7 F (36.5 C) 98.4 F (36.9 C) 97.6 F (36.4 C)  TempSrc: Oral Oral Oral Oral  Resp: 17 19 17    Height:      Weight:      SpO2: 95% 99% 98% 100%   No intake or output data in the  24 hours ending 11/01/14 1313 Weight change:  Exam:   General:  Pt is alert, follows commands appropriately, not in acute distress  HEENT: No icterus, No thrush, [place]/AT  Cardiovascular: RRR, S1/S2, no rubs, no gallops  Respiratory: CTA bilaterally, no wheezing, no crackles, no rhonchi  Abdomen: Soft/+BS, non tender, non distended, no  guarding  Extremities: No edema, No lymphangitis, No petechiae, No rashes, no synovitis  Data Reviewed: Basic Metabolic Panel:  Recent Labs Lab 10/30/14 2137 10/31/14 0221 10/31/14 0540 11/01/14 0907  NA 135  --  136 137  K 4.2  --  4.1 3.9  CL 103  --  101 108  CO2 23  --  29 21  GLUCOSE 181*  --  168* 100*  BUN 21  --  21 17  CREATININE 1.32* 1.43* 1.42* 1.03  CALCIUM 9.2  --  8.7 8.2*   Liver Function Tests:  Recent Labs Lab 10/30/14 2137 10/31/14 0540 11/01/14 0907  AST 23 22 21   ALT 17 15 14   ALKPHOS 80 70 68  BILITOT 0.2* 0.4 0.4  PROT 6.8 6.0 5.6*  ALBUMIN 3.7 3.1* 3.0*    Recent Labs Lab 10/30/14 2137  LIPASE 36   No results for input(s): AMMONIA in the last 168 hours. CBC:  Recent Labs Lab 10/30/14 2137 10/31/14 0221 10/31/14 0540 11/01/14 0907  WBC 12.6* 13.8* 9.7 6.3  NEUTROABS  --   --  8.5* 4.4  HGB 12.9 13.6 11.6* 11.5*  HCT 39.3 41.0 35.9* 35.0*  MCV 87.7 88.0 88.6 88.6  PLT 248 241 222 202   Cardiac Enzymes: No results for input(s): CKTOTAL, CKMB, CKMBINDEX, TROPONINI in the last 168 hours. BNP: Invalid input(s): POCBNP CBG:  Recent Labs Lab 10/31/14 1613 10/31/14 2044 11/01/14 0013 11/01/14 0400 11/01/14 1232  GLUCAP 165* 152* 127* 100* 82    No results found for this or any previous visit (from the past 240 hour(s)).   Scheduled Meds: . heparin  5,000 Units Subcutaneous 3 times per day  . insulin aspart  0-9 Units Subcutaneous 6 times per day   Continuous Infusions: . sodium chloride    . sodium chloride 100 mL/hr at 10/31/14 1545     Jermey Closs, DO  [facility] Pager [phone]  If 7PM-7AM, please contact night-coverage [email or web address] Password [email or web address] 11/01/2014, 1:13 PM   LOS: 1 day

## 2014-11-01 NOTE — Progress Notes (Signed)
Ferdinand Cava, PA returned page.  Ok to leave out NG tube x-rays pending.

## 2014-11-01 NOTE — Progress Notes (Signed)
Dr Tat advised contact [facility], they are managing.

## 2014-11-02 LAB — CBC WITH DIFFERENTIAL/PLATELET
Basophils Absolute: 0 10*3/uL (ref 0.0–0.1)
Basophils Relative: 0 % (ref 0–1)
Eosinophils Absolute: 0.2 10*3/uL (ref 0.0–0.7)
Eosinophils Relative: 3 % (ref 0–5)
HCT: 35.3 % — ABNORMAL LOW (ref 36.0–46.0)
Hemoglobin: 11.5 g/dL — ABNORMAL LOW (ref 12.0–15.0)
LYMPHS ABS: 1.3 10*3/uL (ref 0.7–4.0)
Lymphocytes Relative: 23 % (ref 12–46)
MCH: 29.3 pg (ref 26.0–34.0)
MCHC: 32.6 g/dL (ref 30.0–36.0)
MCV: 89.8 fL (ref 78.0–100.0)
MONO ABS: 0.5 10*3/uL (ref 0.1–1.0)
MONOS PCT: 9 % (ref 3–12)
NEUTROS PCT: 65 % (ref 43–77)
Neutro Abs: 3.7 10*3/uL (ref 1.7–7.7)
PLATELETS: 194 10*3/uL (ref 150–400)
RBC: 3.93 MIL/uL (ref 3.87–5.11)
RDW: 15.2 % (ref 11.5–15.5)
WBC: 5.7 10*3/uL (ref 4.0–10.5)

## 2014-11-02 LAB — COMPREHENSIVE METABOLIC PANEL
ALK PHOS: 64 U/L (ref 39–117)
ALT: 14 U/L (ref 0–35)
ANION GAP: 6 (ref 5–15)
AST: 19 U/L (ref 0–37)
Albumin: 2.9 g/dL — ABNORMAL LOW (ref 3.5–5.2)
BUN: 11 mg/dL (ref 6–23)
CHLORIDE: 109 mmol/L (ref 96–112)
CO2: 24 mmol/L (ref 19–32)
CREATININE: 0.88 mg/dL (ref 0.50–1.10)
Calcium: 8.3 mg/dL — ABNORMAL LOW (ref 8.4–10.5)
GFR calc non Af Amer: 57 mL/min — ABNORMAL LOW (ref >90)
GFR, EST AFRICAN AMERICAN: 66 mL/min — AB (ref >90)
GLUCOSE: 92 mg/dL (ref 70–99)
Potassium: 3.7 mmol/L (ref 3.5–5.1)
SODIUM: 139 mmol/L (ref 135–145)
Total Bilirubin: 0.4 mg/dL (ref 0.3–1.2)
Total Protein: 5.5 g/dL — ABNORMAL LOW (ref 6.0–8.3)

## 2014-11-02 LAB — GLUCOSE, CAPILLARY
GLUCOSE-CAPILLARY: 80 mg/dL (ref 70–99)
GLUCOSE-CAPILLARY: 86 mg/dL (ref 70–99)
Glucose-Capillary: 102 mg/dL — ABNORMAL HIGH (ref 70–99)
Glucose-Capillary: 102 mg/dL — ABNORMAL HIGH (ref 70–99)
Glucose-Capillary: 118 mg/dL — ABNORMAL HIGH (ref 70–99)
Glucose-Capillary: 80 mg/dL (ref 70–99)

## 2014-11-02 NOTE — Progress Notes (Addendum)
PROGRESS NOTE  Alicia [name] ERD:[id number] DOB: 16-Nov-1922 DOA: 10/30/2014 PCP: Shellia Carwin, PA-C   Brief History 81 year old female with a history of dementia, parkinsonism, recurrent small bowel obstruction, ovarian cancer, hypertension, PSVT, and diet-controlled diabetes mellitus presents with one-day history of vomiting and abdominal pain. The patient is unable to provide any history due to her advanced dementia. History is obtained from the patient's daughter-in-law, Mikell Camp. Apparently, the patient has been in her usual state of health until 10/30/2014 when she ate a pulled pork sandwich. She began choking on it and then ended up having vomiting. The patient was brought to the emergency department where she was given clear liquids. She ended up having more vomiting. There's been no reports of fevers, chills, chest pain, shortness breath, diarrhea, abdominal pain, dysuria.  Assessment/Plan: Intractable vomiting/abdominal pain-->chronic PSBO -Patient has had a history of recurrent SBO in the past-this is her third admit in 6 months for similar presentation -CT abdomen and pelvis with oral contrast only-->PSBO with question of endoluminal mass -2/20 AXR--some improvement in PSBO -11/02/14--tolerating clear liquids -11/02/14 discussed with general surgery and GI-->order CT enterography with oral contrast--with temporary NG placement for the contrast -Certainly, the patient's large hiatus hernia may be contributing to her vomiting -Continue IV fluids -appreciate general surgery followup -pt also has rash to IV dye so she would need prep with prednisone if IV contrast is given -discussed case with radiologist, Dr. Patrica Duel do SBFT if CT enterography was to be without IV contrast.  If SBFT is non-diagnostic, then will discuss with family risks/benefits of CT enterography WITH IV contrast (renal function is improving) Hypertension -restart amlodipine when reliably  taking po Diabetes mellitus type 2 -Diet controlled -Hemoglobin A1c--6.3 -Given the patient's age and comorbidities, allow for liberal glycemic control CKD stage III -Baseline creatinine 1.0-1.4 Dementia -restart Namenda when able to take po HLD -hold atorvastatin for now -restart when able to take po  Family Communication: Daughter in law--Sue updated on phone 2/20 Disposition Plan: Home when medically stable Total time spent today 35 min, >50% spent counseling and coordinating care  Procedures/Studies: Ct Abdomen Pelvis Wo Contrast  10/31/2014   CLINICAL DATA:  Abdominal pain. History of small bowel obstructions.  EXAM: CT ABDOMEN AND PELVIS WITHOUT CONTRAST  TECHNIQUE: Multidetector CT imaging of the abdomen and pelvis was performed following the standard protocol without IV contrast.  COMPARISON:  CT 09/18/2014, plain films 10/31/2014  FINDINGS: Lower chest: Mild atelectasis at the lung bases is increased compared to prior. Right lower lobe atelectasis is more dense than the left.  Hepatobiliary: Non IV contrast images demonstrate no focal hepatic lesion. The gallbladder is normal volume.  Pancreas: Pancreas is normal. No ductal dilatation. No pancreatic inflammation.  Spleen: Normal spleen.  Adrenals/urinary tract: Adrenal glands are normal. No nephrolithiasis or ureterolithiasis. No obstructive uropathy.  Stomach/Bowel: The stomach is distended. There is a large hiatal hernia. The majority of the oral contrast remains within the stomach. The duodenum is normal caliber. The proximal jejunum is normal caliber. The distal ileum is dilated over a long segment involving the near entirety of the ileum. Segments are dilated up to 3.2 cm. There are several air-fluid levels. There is a smooth transition to normal caliber distal ileum leading up to the terminal ileum seen on images 44 through 52 of the axial series and clearly demonstrated on images 35 through 46 of the coronal data set. The  distal ileum leading up  to terminal ileum measures 15 mm while the ileum proximal to this transition Mmeasures 33 mm. This pattern of caliber change is very similar to comparison CT of 09/18/2014. No clear evidence of adhesions. Potential endoluminal lesion on coronal image 35, series 202) and axial image 45. There is no pneumatosis or portal venous gas. No free fluid in the head or pelvis.  The ascending, transverse, and descending colon relatively collapsed. There is small amount of stool in the rectum. There scattered diverticula throughout the colon without acute inflammation.  Vascular/Lymphatic: Abdominal aorta is normal caliber with atherosclerotic calcification. There is no retroperitoneal or periportal lymphadenopathy. No pelvic lymphadenopathy.  Reproductive: Post hysterectomy  Musculoskeletal: No aggressive osseous lesion.  IMPRESSION: 1. Chronic partial mechanical small bowel obstruction with caliber change in the distal ileum approximately 8 cm from the terminal ileum. Potential endoluminal lesion identified but poorly characterized without IV contrast. Recommend CT enterography or capsule optical evaluation. 2. Colonic diverticulosis without acute diverticulitis. 3. Increasing basilar atelectasis more dense on the right. These results will be called to the ordering clinician or representative by the Radiologist Assistant, and communication documented in the PACS or zVision Dashboard.   Electronically Signed   By: Suzy Bouchard M.D.   On: 10/31/2014 12:34   Dg Chest 2 View  10/30/2014   CLINICAL DATA:  Chest pain post choking episode while eating a pulled pork sandwhich, was able to cough it up, single episode of vomiting follow by substernal chest pain  EXAM: CHEST  2 VIEW  COMPARISON:  10/17/2014  FINDINGS: Loop recorder projects over cardiac silhouette.  Upper normal heart size.  Normal mediastinal contours and pulmonary vascularity.  Atherosclerotic calcification aorta.  Minimal bibasilar  atelectasis.  Lungs otherwise clear.  No pleural effusion or pneumothorax.  Bones demineralized.  IMPRESSION: Minimal bibasilar atelectasis.   Electronically Signed   By: Lavonia Dana M.D.   On: 10/30/2014 22:21   Dg Abd 1 View  10/31/2014   CLINICAL DATA:  Emesis  EXAM: ABDOMEN - 1 VIEW  COMPARISON:  None.  FINDINGS: The bowel gas pattern is normal. No radio-opaque calculi or other significant radiographic abnormality are seen.  IMPRESSION: Negative.   Electronically Signed   By: Andreas Newport M.D.   On: 10/31/2014 01:51   Dg Abd 2 Views  11/01/2014   CLINICAL DATA:  81 year old female with a history of small bowel obstruction.  EXAM: ABDOMEN - 2 VIEW  COMPARISON:  None.  FINDINGS: Enteric contrast, present on the prior CT, has traversed the colon. No evidence of colonic distention. Multiple colonic diverticula. Small central small bowel gas, measures borderline enlarged.  No definite free air.  Multiple surgical clips within the abdomen  No unexpected calcifications.  No displaced fracture.  IMPRESSION: Enteric contrast has a traversed the GI system from the stomach through the colon on today's study, with no indication of a complete small bowel obstruction.  Diverticular disease.  Surgical clips within the abdomen.   Electronically Signed   By: Corrie Mckusick D.O.   On: 11/01/2014 08:03         Subjective: No further vomiting. Patient is tolerating clear liquids. She is passing flatus. Denies any chest pain, shortness breath, abdominal pain, vomiting.  Objective: Filed Vitals:   11/01/14 1700 11/01/14 2002 11/02/14 0500 11/02/14 0936  BP: 140/61 189/57 170/74 160/78  Pulse: 70 73 76 78  Temp: 98.7 F (37.1 C) 97.7 F (36.5 C) 97.5 F (36.4 C) 97.5 F (36.4 C)  TempSrc: Oral Oral  Oral Oral  Resp: 18 16 18 17   Height:      Weight:  63.3 kg (139 lb 8.8 oz)    SpO2: 100% 100% 95% 98%    Intake/Output Summary (Last 24 hours) at 11/02/14 1556 Last data filed at 11/02/14 1250  Gross  per 24 hour  Intake    640 ml  Output    400 ml  Net    240 ml   Weight change:  Exam:   General:  Pt is alert, follows commands appropriately, not in acute distress  HEENT: No icterus, No thrush,  [place]/AT  Cardiovascular: RRR, S1/S2, no rubs, no gallops  Respiratory: CTA bilaterally, no wheezing, no crackles, no rhonchi  Abdomen: Soft/+BS, periumbilical pain without any rebound, non distended, no guarding  Extremities: No edema, No lymphangitis, No petechiae, No rashes, no synovitis  Data Reviewed: Basic Metabolic Panel:  Recent Labs Lab 10/30/14 2137 10/31/14 0221 10/31/14 0540 11/01/14 0907 11/02/14 0616  NA 135  --  136 137 139  K 4.2  --  4.1 3.9 3.7  CL 103  --  101 108 109  CO2 23  --  29 21 24   GLUCOSE 181*  --  168* 100* 92  BUN 21  --  21 17 11   CREATININE 1.32* 1.43* 1.42* 1.03 0.88  CALCIUM 9.2  --  8.7 8.2* 8.3*   Liver Function Tests:  Recent Labs Lab 10/30/14 2137 10/31/14 0540 11/01/14 0907 11/02/14 0616  AST 23 22 21 19   ALT 17 15 14 14   ALKPHOS 80 70 68 64  BILITOT 0.2* 0.4 0.4 0.4  PROT 6.8 6.0 5.6* 5.5*  ALBUMIN 3.7 3.1* 3.0* 2.9*    Recent Labs Lab 10/30/14 2137  LIPASE 36   No results for input(s): AMMONIA in the last 168 hours. CBC:  Recent Labs Lab 10/30/14 2137 10/31/14 0221 10/31/14 0540 11/01/14 0907 11/02/14 0616  WBC 12.6* 13.8* 9.7 6.3 5.7  NEUTROABS  --   --  8.5* 4.4 3.7  HGB 12.9 13.6 11.6* 11.5* 11.5*  HCT 39.3 41.0 35.9* 35.0* 35.3*  MCV 87.7 88.0 88.6 88.6 89.8  PLT 248 241 222 202 194   Cardiac Enzymes: No results for input(s): CKTOTAL, CKMB, CKMBINDEX, TROPONINI in the last 168 hours. BNP: Invalid input(s): POCBNP CBG:  Recent Labs Lab 11/01/14 1959 11/02/14 0005 11/02/14 0358 11/02/14 0724 11/02/14 1104  GLUCAP 110* 80 102* 80 102*    Recent Results (from the past 240 hour(s))  Urine culture     Status: None   Collection Time: 10/31/14 10:54 AM  Result Value Ref Range Status    Specimen Description URINE, CATHETERIZED  Final   Special Requests NONE  Final   Colony Count NO GROWTH Performed at [facility]   Final   Culture NO GROWTH Performed at [facility]   Final   Report Status 11/01/2014 FINAL  Final     Scheduled Meds: . heparin  5,000 Units Subcutaneous 3 times per day  . insulin aspart  0-9 Units Subcutaneous 6 times per day   Continuous Infusions: . sodium chloride    . sodium chloride 100 mL/hr at 10/31/14 1545     Kapena Hamme, DO  [facility] Pager [phone]  If 7PM-7AM, please contact night-coverage [email or web address] Password [facility] 11/02/2014, 3:56 PM   LOS: 2 days

## 2014-11-02 NOTE — Progress Notes (Signed)
[facility] PROGRESS NOTE Subjective patient tolerating clear liquid diet with no nausea or vomiting.  Objective: Vital signs in last 24 hours: Temp:  [97.5 F (36.4 C)-98.7 F (37.1 C)] 97.5 F (36.4 C) (02/21 0500) Pulse Rate:  [70-76] 76 (02/21 0500) Resp:  [16-18] 18 (02/21 0500) BP: (140-189)/(48-74) 170/74 mmHg (02/21 0500) SpO2:  [95 %-100 %] 95 % (02/21 0500) Weight:  [63.3 kg (139 lb 8.8 oz)] 63.3 kg (139 lb 8.8 oz) (02/20 2002)    Intake/Output from previous day: 02/20 0701 - 02/21 0700 In: -  Out: 400 [Urine:400] Intake/Output this shift:    PE: General--pleasantly confused. Has mittens on both hands   Abdomen-- nondistended, nontender, bowel sounds present  Lab Results:  Recent Labs  10/30/14 2137 10/31/14 0221 10/31/14 0540 11/01/14 0907 11/02/14 0616  WBC 12.6* 13.8* 9.7 6.3 5.7  HGB 12.9 13.6 11.6* 11.5* 11.5*  HCT 39.3 41.0 35.9* 35.0* 35.3*  PLT 248 241 222 202 194   BMET  Recent Labs  10/30/14 2137 10/31/14 0221 10/31/14 0540 11/01/14 0907 11/02/14 0616  NA 135  --  136 137 139  K 4.2  --  4.1 3.9 3.7  CL 103  --  101 108 109  CO2 23  --  29 21 24   CREATININE 1.32* 1.43* 1.42* 1.03 0.88   LFT  Recent Labs  10/30/14 2137 10/31/14 0540 11/01/14 0907 11/02/14 0616  PROT 6.8 6.0 5.6* 5.5*  AST 23 22 21 19   ALT 17 15 14 14   ALKPHOS 80 70 68 64  BILITOT 0.2* 0.4 0.4 0.4  BILIDIR <0.1  --   --   --   IBILI NOT CALCULATED  --   --   --    PT/INR No results for input(s): LABPROT, INR in the last 72 hours. PANCREAS  Recent Labs  10/30/14 2137  LIPASE 36         Studies/Results: Ct Abdomen Pelvis Wo Contrast  10/31/2014   CLINICAL DATA:  Abdominal pain. History of small bowel obstructions.  EXAM: CT ABDOMEN AND PELVIS WITHOUT CONTRAST  TECHNIQUE: Multidetector CT imaging of the abdomen and pelvis was performed following the standard protocol without IV contrast.  COMPARISON:  CT 09/18/2014, plain films  10/31/2014  FINDINGS: Lower chest: Mild atelectasis at the lung bases is increased compared to prior. Right lower lobe atelectasis is more dense than the left.  Hepatobiliary: Non IV contrast images demonstrate no focal hepatic lesion. The gallbladder is normal volume.  Pancreas: Pancreas is normal. No ductal dilatation. No pancreatic inflammation.  Spleen: Normal spleen.  Adrenals/urinary tract: Adrenal glands are normal. No nephrolithiasis or ureterolithiasis. No obstructive uropathy.  Stomach/Bowel: The stomach is distended. There is a large hiatal hernia. The majority of the oral contrast remains within the stomach. The duodenum is normal caliber. The proximal jejunum is normal caliber. The distal ileum is dilated over a long segment involving the near entirety of the ileum. Segments are dilated up to 3.2 cm. There are several air-fluid levels. There is a smooth transition to normal caliber distal ileum leading up to the terminal ileum seen on images 44 through 52 of the axial series and clearly demonstrated on images 35 through 46 of the coronal data set. The distal ileum leading up to terminal ileum measures 15 mm while the ileum proximal to this transition Mmeasures 33 mm. This pattern of caliber change is very similar to comparison CT of 09/18/2014. No clear evidence of adhesions. Potential endoluminal lesion on coronal image  35, series 202) and axial image 45. There is no pneumatosis or portal venous gas. No free fluid in the head or pelvis.  The ascending, transverse, and descending colon relatively collapsed. There is small amount of stool in the rectum. There scattered diverticula throughout the colon without acute inflammation.  Vascular/Lymphatic: Abdominal aorta is normal caliber with atherosclerotic calcification. There is no retroperitoneal or periportal lymphadenopathy. No pelvic lymphadenopathy.  Reproductive: Post hysterectomy  Musculoskeletal: No aggressive osseous lesion.  IMPRESSION: 1.  Chronic partial mechanical small bowel obstruction with caliber change in the distal ileum approximately 8 cm from the terminal ileum. Potential endoluminal lesion identified but poorly characterized without IV contrast. Recommend CT enterography or capsule optical evaluation. 2. Colonic diverticulosis without acute diverticulitis. 3. Increasing basilar atelectasis more dense on the right. These results will be called to the ordering clinician or representative by the Radiologist Assistant, and communication documented in the PACS or zVision Dashboard.   Electronically Signed   By: Suzy Bouchard M.D.   On: 10/31/2014 12:34   Dg Abd 2 Views  11/01/2014   CLINICAL DATA:  81 year old female with a history of small bowel obstruction.  EXAM: ABDOMEN - 2 VIEW  COMPARISON:  None.  FINDINGS: Enteric contrast, present on the prior CT, has traversed the colon. No evidence of colonic distention. Multiple colonic diverticula. Small central small bowel gas, measures borderline enlarged.  No definite free air.  Multiple surgical clips within the abdomen  No unexpected calcifications.  No displaced fracture.  IMPRESSION: Enteric contrast has a traversed the GI system from the stomach through the colon on today's study, with no indication of a complete small bowel obstruction.  Diverticular disease.  Surgical clips within the abdomen.   Electronically Signed   By: Corrie Mckusick D.O.   On: 11/01/2014 08:03    Medications: I have reviewed the patient's current medications.  Assessment/Plan: 1. Recurrent chronic intermittent SBO. Lesion appears to be in the terminal ileum. I think she could probably tolerate some type of radiographic procedure with liquid such as CT enterography her small bowel follow-through even if a temporary NG tube had to be placed. I'm concerned about doing capsule endoscopy. In Crohn's disease of the terminal ileum the metal capsules have become lodged requiring emergency surgery for acute SBO I  would like to avoid that if possible. It's likely that she has adhesions and fixed obstruction in that area Lewanda Perea JR,Harshan Kearley L 11/02/2014, 8:57 AM

## 2014-11-02 NOTE — Progress Notes (Signed)
Patient ID: Alicia [name], female   DOB: 12-26-1922, 81 y.o.   MRN: [id number]    Subjective: Pt denies pain.  Thinks she is passing gas.  No n/v.    Objective: Vital signs in last 24 hours: Temp:  [97.5 F (36.4 C)-98.7 F (37.1 C)] 97.5 F (36.4 C) (02/21 0936) Pulse Rate:  [70-78] 78 (02/21 0936) Resp:  [16-18] 17 (02/21 0936) BP: (140-189)/(57-78) 160/78 mmHg (02/21 0936) SpO2:  [95 %-100 %] 98 % (02/21 0936) Weight:  [139 lb 8.8 oz (63.3 kg)] 139 lb 8.8 oz (63.3 kg) (02/20 2002)    Intake/Output from previous day: 02/20 0701 - 02/21 0700 In: -  Out: 400 [Urine:400] Intake/Output this shift: Total I/O In: 320 [P.O.:320] Out: -   PE: Abd: soft, non tender, non distended  Lab Results:   Recent Labs  11/01/14 0907 11/02/14 0616  WBC 6.3 5.7  HGB 11.5* 11.5*  HCT 35.0* 35.3*  PLT 202 194   BMET  Recent Labs  11/01/14 0907 11/02/14 0616  NA 137 139  K 3.9 3.7  CL 108 109  CO2 21 24  GLUCOSE 100* 92  BUN 17 11  CREATININE 1.03 0.88  CALCIUM 8.2* 8.3*   PT/INR No results for input(s): LABPROT, INR in the last 72 hours. CMP     Component Value Date/Time   NA 139 11/02/2014 0616   K 3.7 11/02/2014 0616   CL 109 11/02/2014 0616   CO2 24 11/02/2014 0616   GLUCOSE 92 11/02/2014 0616   BUN 11 11/02/2014 0616   CREATININE 0.88 11/02/2014 0616   CALCIUM 8.3* 11/02/2014 0616   PROT 5.5* 11/02/2014 0616   ALBUMIN 2.9* 11/02/2014 0616   AST 19 11/02/2014 0616   ALT 14 11/02/2014 0616   ALKPHOS 64 11/02/2014 0616   BILITOT 0.4 11/02/2014 0616   GFRNONAA 57* 11/02/2014 0616   GFRAA 66* 11/02/2014 0616   Lipase     Component Value Date/Time   LIPASE 36 10/30/2014 2137       Studies/Results: Ct Abdomen Pelvis Wo Contrast  10/31/2014   CLINICAL DATA:  Abdominal pain. History of small bowel obstructions.  EXAM: CT ABDOMEN AND PELVIS WITHOUT CONTRAST  TECHNIQUE: Multidetector CT imaging of the abdomen and pelvis was performed following the  standard protocol without IV contrast.  COMPARISON:  CT 09/18/2014, plain films 10/31/2014  FINDINGS: Lower chest: Mild atelectasis at the lung bases is increased compared to prior. Right lower lobe atelectasis is more dense than the left.  Hepatobiliary: Non IV contrast images demonstrate no focal hepatic lesion. The gallbladder is normal volume.  Pancreas: Pancreas is normal. No ductal dilatation. No pancreatic inflammation.  Spleen: Normal spleen.  Adrenals/urinary tract: Adrenal glands are normal. No nephrolithiasis or ureterolithiasis. No obstructive uropathy.  Stomach/Bowel: The stomach is distended. There is a large hiatal hernia. The majority of the oral contrast remains within the stomach. The duodenum is normal caliber. The proximal jejunum is normal caliber. The distal ileum is dilated over a long segment involving the near entirety of the ileum. Segments are dilated up to 3.2 cm. There are several air-fluid levels. There is a smooth transition to normal caliber distal ileum leading up to the terminal ileum seen on images 44 through 52 of the axial series and clearly demonstrated on images 35 through 46 of the coronal data set. The distal ileum leading up to terminal ileum measures 15 mm while the ileum proximal to this transition Mmeasures 33 mm. This pattern of caliber  change is very similar to comparison CT of 09/18/2014. No clear evidence of adhesions. Potential endoluminal lesion on coronal image 35, series 202) and axial image 45. There is no pneumatosis or portal venous gas. No free fluid in the head or pelvis.  The ascending, transverse, and descending colon relatively collapsed. There is small amount of stool in the rectum. There scattered diverticula throughout the colon without acute inflammation.  Vascular/Lymphatic: Abdominal aorta is normal caliber with atherosclerotic calcification. There is no retroperitoneal or periportal lymphadenopathy. No pelvic lymphadenopathy.  Reproductive: Post  hysterectomy  Musculoskeletal: No aggressive osseous lesion.  IMPRESSION: 1. Chronic partial mechanical small bowel obstruction with caliber change in the distal ileum approximately 8 cm from the terminal ileum. Potential endoluminal lesion identified but poorly characterized without IV contrast. Recommend CT enterography or capsule optical evaluation. 2. Colonic diverticulosis without acute diverticulitis. 3. Increasing basilar atelectasis more dense on the right. These results will be called to the ordering clinician or representative by the Radiologist Assistant, and communication documented in the PACS or zVision Dashboard.   Electronically Signed   By: Suzy Bouchard M.D.   On: 10/31/2014 12:34   Dg Abd 2 Views  11/01/2014   CLINICAL DATA:  81 year old female with a history of small bowel obstruction.  EXAM: ABDOMEN - 2 VIEW  COMPARISON:  None.  FINDINGS: Enteric contrast, present on the prior CT, has traversed the colon. No evidence of colonic distention. Multiple colonic diverticula. Small central small bowel gas, measures borderline enlarged.  No definite free air.  Multiple surgical clips within the abdomen  No unexpected calcifications.  No displaced fracture.  IMPRESSION: Enteric contrast has a traversed the GI system from the stomach through the colon on today's study, with no indication of a complete small bowel obstruction.  Diverticular disease.  Surgical clips within the abdomen.   Electronically Signed   By: Corrie Mckusick D.O.   On: 11/01/2014 08:03    Anti-infectives: Anti-infectives    None       Assessment/Plan  1. Chronic PSBO, ? Endoluminal mass 2. Dementia 3. Parkinson's disease  Plan: Would do SBFT or CT colonography with NGT long enough for contrast if possible.  Agree with Dr. Oletta Lamas that I would NOT do capsule, since she may not need surgery, and if a capsule was stuck, she would definitely need surgery.    Films better yesterday with contrast through to colon.   Patient would not do well with surgery with worsening of dementia.      LOS: 2 days    Guillermina Shaft 11/02/2014, 10:57 AM

## 2014-11-03 ENCOUNTER — Inpatient Hospital Stay (HOSPITAL_COMMUNITY): Payer: Medicare Other

## 2014-11-03 DIAGNOSIS — R112 Nausea with vomiting, unspecified: Secondary | ICD-10-CM

## 2014-11-03 LAB — COMPREHENSIVE METABOLIC PANEL
ALK PHOS: 60 U/L (ref 39–117)
ALT: 18 U/L (ref 0–35)
AST: 23 U/L (ref 0–37)
Albumin: 2.6 g/dL — ABNORMAL LOW (ref 3.5–5.2)
Anion gap: 5 (ref 5–15)
BUN: 7 mg/dL (ref 6–23)
CHLORIDE: 107 mmol/L (ref 96–112)
CO2: 26 mmol/L (ref 19–32)
CREATININE: 1.03 mg/dL (ref 0.50–1.10)
Calcium: 8.2 mg/dL — ABNORMAL LOW (ref 8.4–10.5)
GFR calc Af Amer: 54 mL/min — ABNORMAL LOW (ref >90)
GFR calc non Af Amer: 47 mL/min — ABNORMAL LOW (ref >90)
Glucose, Bld: 104 mg/dL — ABNORMAL HIGH (ref 70–99)
POTASSIUM: 3.1 mmol/L — AB (ref 3.5–5.1)
SODIUM: 138 mmol/L (ref 135–145)
Total Bilirubin: 0.4 mg/dL (ref 0.3–1.2)
Total Protein: 5.2 g/dL — ABNORMAL LOW (ref 6.0–8.3)

## 2014-11-03 LAB — CBC WITH DIFFERENTIAL/PLATELET
BASOS ABS: 0 10*3/uL (ref 0.0–0.1)
Basophils Relative: 0 % (ref 0–1)
EOS ABS: 0.1 10*3/uL (ref 0.0–0.7)
EOS PCT: 3 % (ref 0–5)
HCT: 32.1 % — ABNORMAL LOW (ref 36.0–46.0)
Hemoglobin: 10.6 g/dL — ABNORMAL LOW (ref 12.0–15.0)
LYMPHS PCT: 27 % (ref 12–46)
Lymphs Abs: 1.1 10*3/uL (ref 0.7–4.0)
MCH: 28.9 pg (ref 26.0–34.0)
MCHC: 33 g/dL (ref 30.0–36.0)
MCV: 87.5 fL (ref 78.0–100.0)
Monocytes Absolute: 0.4 10*3/uL (ref 0.1–1.0)
Monocytes Relative: 10 % (ref 3–12)
NEUTROS ABS: 2.6 10*3/uL (ref 1.7–7.7)
Neutrophils Relative %: 60 % (ref 43–77)
Platelets: 197 10*3/uL (ref 150–400)
RBC: 3.67 MIL/uL — AB (ref 3.87–5.11)
RDW: 15.3 % (ref 11.5–15.5)
WBC: 4.3 10*3/uL (ref 4.0–10.5)

## 2014-11-03 LAB — GLUCOSE, CAPILLARY
GLUCOSE-CAPILLARY: 106 mg/dL — AB (ref 70–99)
Glucose-Capillary: 103 mg/dL — ABNORMAL HIGH (ref 70–99)
Glucose-Capillary: 105 mg/dL — ABNORMAL HIGH (ref 70–99)
Glucose-Capillary: 108 mg/dL — ABNORMAL HIGH (ref 70–99)
Glucose-Capillary: 85 mg/dL (ref 70–99)
Glucose-Capillary: 119 mg/dL — ABNORMAL HIGH (ref 70–99)

## 2014-11-03 LAB — HEMOGLOBIN A1C
Hgb A1c MFr Bld: 6.5 % — ABNORMAL HIGH (ref 4.8–5.6)
Mean Plasma Glucose: 140 mg/dL

## 2014-11-03 MED ORDER — CETYLPYRIDINIUM CHLORIDE 0.05 % MT LIQD
7.0000 mL | Freq: Two times a day (BID) | OROMUCOSAL | Status: DC
  Administered 2014-11-03 – 2014-11-04 (×2): 7 mL via OROMUCOSAL

## 2014-11-03 MED ORDER — SODIUM CHLORIDE 0.9 % IV SOLN
INTRAVENOUS | Status: DC
  Administered 2014-11-03: 13:00:00 via INTRAVENOUS
  Filled 2014-11-03 (×3): qty 1000

## 2014-11-03 MED ORDER — POTASSIUM CHLORIDE 10 MEQ/100ML IV SOLN
10.0000 meq | INTRAVENOUS | Status: AC
  Administered 2014-11-03 (×3): 10 meq via INTRAVENOUS
  Filled 2014-11-03 (×3): qty 100

## 2014-11-03 MED ORDER — DIPHENHYDRAMINE HCL 50 MG/ML IJ SOLN: 50.0000 mg | Freq: Once | INTRAMUSCULAR | Status: DC

## 2014-11-03 MED ORDER — METHYLPREDNISOLONE SODIUM SUCC 40 MG IJ SOLR
40.0000 mg | Freq: Once | INTRAMUSCULAR | Status: DC
  Filled 2014-11-03: qty 1

## 2014-11-03 NOTE — Progress Notes (Signed)
Pts family stated spoke with Saverio Danker PA and decided against CT enterography.

## 2014-11-03 NOTE — Progress Notes (Signed)
Pt continually trying to get out of bed.  Pt moved to nurses desk for closer observation.  Chair alarm in place.

## 2014-11-03 NOTE — Progress Notes (Signed)
PROGRESS NOTE  Alicia [name] OMV:[id number] DOB: 1923-06-21 DOA: 10/30/2014 PCP: Shellia Carwin, PA-C  Assessment/Plan: Intractable vomiting/abdominal pain-->chronic PSBO -Patient has had a history of recurrent SBO in the past-this is her third admit in 6 months for similar presentation -CT abdomen and pelvis with oral contrast only-->PSBO with question of endoluminal mass -2/20 AXR--some improvement in PSBO -11/02/14--tolerating clear liquids -11/02/14 discussed with general surgery and GI-->order CT enterography with oral contrast--with temporary NG placement for the contrast -Certainly, the patient's large hiatus hernia may be contributing to her vomiting -Continue IV fluids -appreciate general surgery followup -pt also has rash to IV dye so she would need prep with prednisone if IV contrast is given -discussed case with radiologist, Dr. Patrica Duel do SBFT if CT enterography was to be without IV contrast. If SBFT is non-diagnostic, then will discuss with family risks/benefits of CT enterography WITH IV contrast (renal function is improving) -11/03/14--SBFT--nondiagnostic for endoluminal mass -Long discussion with the patient's family at the bedside to discuss the risks, benefits, and alternatives of CT enterography including possible anaphylaxis to IV dye--> they wish to pursue this study at this time--patient will need prep with prednisone protocol for IV dye allergy Hypertension -restart amlodipine when reliably taking po Diabetes mellitus type 2 -Diet controlled -Hemoglobin A1c--6.3 -Given the patient's age and comorbidities, allow for liberal glycemic control CKD stage III -Baseline creatinine 1.0-1.4 Dementia -restart Namenda when able to take po HLD -hold atorvastatin for now -restart when able to take po  Family Communication: Signed and oneson and daughter updated at bedsid Disposition Plan: Home when medically stable Total time spent today 35 min,  >50% spent counseling and coordinating care       Procedures/Studies: Ct Abdomen Pelvis Wo Contrast  10/31/2014   CLINICAL DATA:  Abdominal pain. History of small bowel obstructions.  EXAM: CT ABDOMEN AND PELVIS WITHOUT CONTRAST  TECHNIQUE: Multidetector CT imaging of the abdomen and pelvis was performed following the standard protocol without IV contrast.  COMPARISON:  CT 09/18/2014, plain films 10/31/2014  FINDINGS: Lower chest: Mild atelectasis at the lung bases is increased compared to prior. Right lower lobe atelectasis is more dense than the left.  Hepatobiliary: Non IV contrast images demonstrate no focal hepatic lesion. The gallbladder is normal volume.  Pancreas: Pancreas is normal. No ductal dilatation. No pancreatic inflammation.  Spleen: Normal spleen.  Adrenals/urinary tract: Adrenal glands are normal. No nephrolithiasis or ureterolithiasis. No obstructive uropathy.  Stomach/Bowel: The stomach is distended. There is a large hiatal hernia. The majority of the oral contrast remains within the stomach. The duodenum is normal caliber. The proximal jejunum is normal caliber. The distal ileum is dilated over a long segment involving the near entirety of the ileum. Segments are dilated up to 3.2 cm. There are several air-fluid levels. There is a smooth transition to normal caliber distal ileum leading up to the terminal ileum seen on images 44 through 52 of the axial series and clearly demonstrated on images 35 through 46 of the coronal data set. The distal ileum leading up to terminal ileum measures 15 mm while the ileum proximal to this transition Mmeasures 33 mm. This pattern of caliber change is very similar to comparison CT of 09/18/2014. No clear evidence of adhesions. Potential endoluminal lesion on coronal image 35, series 202) and axial image 45. There is no pneumatosis or portal venous gas. No free fluid in the head or pelvis.  The ascending, transverse, and descending  colon relatively  collapsed. There is small amount of stool in the rectum. There scattered diverticula throughout the colon without acute inflammation.  Vascular/Lymphatic: Abdominal aorta is normal caliber with atherosclerotic calcification. There is no retroperitoneal or periportal lymphadenopathy. No pelvic lymphadenopathy.  Reproductive: Post hysterectomy  Musculoskeletal: No aggressive osseous lesion.  IMPRESSION: 1. Chronic partial mechanical small bowel obstruction with caliber change in the distal ileum approximately 8 cm from the terminal ileum. Potential endoluminal lesion identified but poorly characterized without IV contrast. Recommend CT enterography or capsule optical evaluation. 2. Colonic diverticulosis without acute diverticulitis. 3. Increasing basilar atelectasis more dense on the right. These results will be called to the ordering clinician or representative by the Radiologist Assistant, and communication documented in the PACS or zVision Dashboard.   Electronically Signed   By: Suzy Bouchard M.D.   On: 10/31/2014 12:34   Dg Chest 2 View  10/30/2014   CLINICAL DATA:  Chest pain post choking episode while eating a pulled pork sandwhich, was able to cough it up, single episode of vomiting follow by substernal chest pain  EXAM: CHEST  2 VIEW  COMPARISON:  10/17/2014  FINDINGS: Loop recorder projects over cardiac silhouette.  Upper normal heart size.  Normal mediastinal contours and pulmonary vascularity.  Atherosclerotic calcification aorta.  Minimal bibasilar atelectasis.  Lungs otherwise clear.  No pleural effusion or pneumothorax.  Bones demineralized.  IMPRESSION: Minimal bibasilar atelectasis.   Electronically Signed   By: Lavonia Dana M.D.   On: 10/30/2014 22:21   Dg Abd 1 View  10/31/2014   CLINICAL DATA:  Emesis  EXAM: ABDOMEN - 1 VIEW  COMPARISON:  None.  FINDINGS: The bowel gas pattern is normal. No radio-opaque calculi or other significant radiographic abnormality are seen.  IMPRESSION: Negative.    Electronically Signed   By: Andreas Newport M.D.   On: 10/31/2014 01:51   Dg Small Bowel  11/03/2014   CLINICAL DATA:  History of small bowel obstruction. Possible intraluminal mass by CT scan.  EXAM: SMALL BOWEL SERIES  COMPARISON:  CT abdomen and pelvis 10/31/2014.  TECHNIQUE: Following ingestion of thin barium, serial small bowel images were obtained including spot views of the terminal ileum.  FLUOROSCOPY TIME:  Radiation Exposure Index (as provided by the fluoroscopic device):  If the device does not provide the exposure index:  Fluoroscopy Time (in minutes and seconds):  24 seconds  Number of Acquired Images:  4  FINDINGS: Scout film demonstrates contrast from the patient's CT scan in the colon with scattered diverticula. The small bowel demonstrates normal caliber throughout. Transit time to the colon was 15 min. Potential endoluminal mass described on report of CT scan is not visible on this examination. No evidence of inflammatory process is identified.  IMPRESSION: Negative for small bowel obstruction. No endoluminal mass is identified. Small bowel follow-through is not sensitive for the detection of endoluminal mass and CT enterography or capsule endoscopy is recommended for further evaluation.  Diverticulosis.   Electronically Signed   By: Inge Rise M.D.   On: 11/03/2014 10:17   Dg Abd 2 Views  11/01/2014   CLINICAL DATA:  81 year old female with a history of small bowel obstruction.  EXAM: ABDOMEN - 2 VIEW  COMPARISON:  None.  FINDINGS: Enteric contrast, present on the prior CT, has traversed the colon. No evidence of colonic distention. Multiple colonic diverticula. Small central small bowel gas, measures borderline enlarged.  No definite free air.  Multiple surgical clips within the abdomen  No unexpected calcifications.  No displaced fracture.  IMPRESSION: Enteric contrast has a traversed the GI system from the stomach through the colon on today's study, with no indication of a  complete small bowel obstruction.  Diverticular disease.  Surgical clips within the abdomen.   Electronically Signed   By: Corrie Mckusick D.O.   On: 11/01/2014 08:03         Subjective: patient is pleasant and confused. No further vomiting. She denies any chest pain, shortness breath, abdominal pain. No reports of respiratory distress.   Objective: Filed Vitals:   11/02/14 0936 11/02/14 1746 11/02/14 2009 11/03/14 0410  BP: 160/78 160/64 168/81 146/47  Pulse: 78 72 72 69  Temp: 97.5 F (36.4 C) 97.6 F (36.4 C) 98.5 F (36.9 C) 98.7 F (37.1 C)  TempSrc: Oral Oral Oral Oral  Resp: 17 17 17 16   Height:   5\' 2"  (1.575 m)   Weight:   62.1 kg (136 lb 14.5 oz)   SpO2: 98% 98% 98% 98%    Intake/Output Summary (Last 24 hours) at 11/03/14 1055 Last data filed at 11/03/14 0410  Gross per 24 hour  Intake    680 ml  Output    250 ml  Net    430 ml   Weight change: -1.2 kg (-2 lb 10.3 oz) Exam:   General:  Pt is alert, follows commands appropriately, not in acute distress  HEENT: No icterus, No thrush,  [place]/AT  Cardiovascular: RRR, S1/S2, no rubs, no gallops  Respiratory: CTA bilaterally, no wheezing, no crackles, no rhonchi  Abdomen: Soft/+BS, non tender, non distended, no guarding  Extremities: No edema, No lymphangitis, No petechiae, No rashes, no synovitis  Data Reviewed: Basic Metabolic Panel:  Recent Labs Lab 10/30/14 2137 10/31/14 0221 10/31/14 0540 11/01/14 0907 11/02/14 0616 11/03/14 0622  NA 135  --  136 137 139 138  K 4.2  --  4.1 3.9 3.7 3.1*  CL 103  --  101 108 109 107  CO2 23  --  29 21 24 26   GLUCOSE 181*  --  168* 100* 92 104*  BUN 21  --  21 17 11 7   CREATININE 1.32* 1.43* 1.42* 1.03 0.88 1.03  CALCIUM 9.2  --  8.7 8.2* 8.3* 8.2*   Liver Function Tests:  Recent Labs Lab 10/30/14 2137 10/31/14 0540 11/01/14 0907 11/02/14 0616 11/03/14 0622  AST 23 22 21 19 23   ALT 17 15 14 14 18   ALKPHOS 80 70 68 64 60  BILITOT 0.2* 0.4 0.4 0.4 0.4   PROT 6.8 6.0 5.6* 5.5* 5.2*  ALBUMIN 3.7 3.1* 3.0* 2.9* 2.6*    Recent Labs Lab 10/30/14 2137  LIPASE 36   No results for input(s): AMMONIA in the last 168 hours. CBC:  Recent Labs Lab 10/31/14 0221 10/31/14 0540 11/01/14 0907 11/02/14 0616 11/03/14 0622  WBC 13.8* 9.7 6.3 5.7 4.3  NEUTROABS  --  8.5* 4.4 3.7 2.6  HGB 13.6 11.6* 11.5* 11.5* 10.6*  HCT 41.0 35.9* 35.0* 35.3* 32.1*  MCV 88.0 88.6 88.6 89.8 87.5  PLT 241 222 202 194 197   Cardiac Enzymes: No results for input(s): CKTOTAL, CKMB, CKMBINDEX, TROPONINI in the last 168 hours. BNP: Invalid input(s): POCBNP CBG:  Recent Labs Lab 11/02/14 1638 11/02/14 2006 11/03/14 0004 11/03/14 0413 11/03/14 0802  GLUCAP 86 118* 106* 103* 105*    Recent Results (from the past 240 hour(s))  Urine culture     Status: None   Collection Time: 10/31/14 10:54 AM  Result Value Ref Range Status   Specimen Description URINE, CATHETERIZED  Final   Special Requests NONE  Final   Colony Count NO GROWTH Performed at [facility]   Final   Culture NO GROWTH Performed at [facility]   Final   Report Status 11/01/2014 FINAL  Final     Scheduled Meds: . heparin  5,000 Units Subcutaneous 3 times per day  . insulin aspart  0-9 Units Subcutaneous 6 times per day  . potassium chloride  10 mEq Intravenous Q1 Hr x 3   Continuous Infusions: . sodium chloride    . sodium chloride 100 mL/hr at 10/31/14 1545  . sodium chloride 0.9 % 1,000 mL with potassium chloride 20 mEq infusion       Arael Piccione, DO  [facility] Pager [phone]  If 7PM-7AM, please contact night-coverage [email or web address] Password [facility] 11/03/2014, 10:55 AM   LOS: 3 days

## 2014-11-03 NOTE — Progress Notes (Signed)
Patient ID: Alicia [name], female   DOB: 09-11-1923, 81 y.o.   MRN: [id number]    Subjective: Pt sitting up today.  Doesn't say much.  Daughter in law states she has had no further nausea or emesis that she can tell.  Objective: Vital signs in last 24 hours: Temp:  [97.6 F (36.4 C)-98.7 F (37.1 C)] 98.7 F (37.1 C) 11-09-22 0410) Pulse Rate:  [69-72] 69 11-09-2022 0410) Resp:  [16-17] 16 11-09-22 0410) BP: (146-168)/(47-81) 146/47 mmHg 11-09-22 0410) SpO2:  [98 %] 98 % 11/09/2022 0410) Weight:  [136 lb 14.5 oz (62.1 kg)] 136 lb 14.5 oz (62.1 kg) (02/21 2009)    Intake/Output from previous day: 02/21 0701 - 2022/11/09 0700 In: 1000 [P.O.:1000] Out: 250 [Urine:250] Intake/Output this shift:    PE: Abd: soft, Nt, Nd, +BS  Lab Results:   Recent Labs  11/02/14 0616 11-09-2014 0622  WBC 5.7 4.3  HGB 11.5* 10.6*  HCT 35.3* 32.1*  PLT 194 197   BMET  Recent Labs  11/02/14 0616 11-09-14 0622  NA 139 138  K 3.7 3.1*  CL 109 107  CO2 24 26  GLUCOSE 92 104*  BUN 11 7  CREATININE 0.88 1.03  CALCIUM 8.3* 8.2*   PT/INR No results for input(s): LABPROT, INR in the last 72 hours. CMP     Component Value Date/Time   NA 138 November 09, 2014 0622   K 3.1* 09-Nov-2014 0622   CL 107 November 09, 2014 0622   CO2 26 November 09, 2014 0622   GLUCOSE 104* 11/09/2014 0622   BUN 7 11-09-2014 0622   CREATININE 1.03 2014/11/09 0622   CALCIUM 8.2* 11/09/14 0622   PROT 5.2* 2014/11/09 0622   ALBUMIN 2.6* 2014-11-09 0622   AST 23 2014/11/09 0622   ALT 18 2014-11-09 0622   ALKPHOS 60 2014-11-09 0622   BILITOT 0.4 11/09/14 0622   GFRNONAA 47* 11-09-14 0622   GFRAA 54* 2014-11-09 0622   Lipase     Component Value Date/Time   LIPASE 36 10/30/2014 2137       Studies/Results: Dg Small Bowel  2014/11/09   CLINICAL DATA:  History of small bowel obstruction. Possible intraluminal mass by CT scan.  EXAM: SMALL BOWEL SERIES  COMPARISON:  CT abdomen and pelvis 10/31/2014.  TECHNIQUE: Following ingestion  of thin barium, serial small bowel images were obtained including spot views of the terminal ileum.  FLUOROSCOPY TIME:  Radiation Exposure Index (as provided by the fluoroscopic device):  If the device does not provide the exposure index:  Fluoroscopy Time (in minutes and seconds):  24 seconds  Number of Acquired Images:  4  FINDINGS: Scout film demonstrates contrast from the patient's CT scan in the colon with scattered diverticula. The small bowel demonstrates normal caliber throughout. Transit time to the colon was 15 min. Potential endoluminal mass described on report of CT scan is not visible on this examination. No evidence of inflammatory process is identified.  IMPRESSION: Negative for small bowel obstruction. No endoluminal mass is identified. Small bowel follow-through is not sensitive for the detection of endoluminal mass and CT enterography or capsule endoscopy is recommended for further evaluation.  Diverticulosis.   Electronically Signed   By: Inge Rise M.D.   On: 11/09/2014 10:17    Anti-infectives: Anti-infectives    None       Assessment/Plan  1. Chronic partial small bowel obstruction 2. Dementia 3. Parkinson's  Plan: 1. I have had another long discussion with the DIL and son today.  The SBFT  shows no evidence of obstruction, although this is unable to evaluate for an endoluminal mass.  I explained to the family, that since they do not want to pursue an operation, finding out if this obstruction is from scar tissue vs a mass does not change treatment.  I discussed options with the family of a palliative PEG tube to help with decompression as needed vs switching her to a low fiber diet and adding a bowel regimen with daily miralax.  The patient was on a very high fiber diet prior to this admission to help with constipation and diverticulosis. The DIL and son discussed all of this with me and their preference is to proceed with the least aggressive measure at this time.  They  would like to try low fiber diets at home to see if this will help her.  I have cancelled her CT E.  I have also discussed this with Dr. Carles Collet as well. 2. Full liquids today.  If she tolerates this, then soft diet in the am.   LOS: 3 days    Malka Bocek E 11/03/2014, 11:49 AM Pager: [phone]

## 2014-11-03 NOTE — Progress Notes (Signed)
Paged Dr. Carles Collet pts family member in room and would like to speak to him.

## 2014-11-03 NOTE — Progress Notes (Signed)
CARE MANAGEMENT NOTE 11/03/2014  Patient:  Alicia [name], Alicia [name]   Account Number:  [id number]  Date Initiated:  11/03/2014  Documentation initiated by:  [facility]  Subjective/Objective Assessment:   chronic partial small obstruction     Action/Plan:   Anticipated DC Date:  11/04/2014   Anticipated DC Plan:  [place]  CM consult      Choice offered to / List presented to:             Status of service:  Completed, signed off Medicare Important Message given?  YES (If response is "NO", the following Medicare IM given date fields will be blank) Date Medicare IM given:  11/03/2014 Medicare IM given by:  [facility] Date Additional Medicare IM given:   Additional Medicare IM given by:    Discharge Disposition:    Per UR Regulation:    If discussed at Long Length of Stay Meetings, dates discussed:    Comments:  11/03/2014 1500 NCM spoke to son, Alicia [name] and wanted NCM to speak with his wife, Alicia [name] # [phone]. Wife states pt has RW at home. States her endurance is poor and they are requesting a light weight wheelchair, but she just received a RW last covered by her insurance. Waiting final recommendations for home. Dtr-in-law is requesting PT eval pt prior to dc. Attending notified. Jonnie Finner RN CCM Case Mgmt phone [phone]

## 2014-11-04 DIAGNOSIS — K6389 Other specified diseases of intestine: Secondary | ICD-10-CM | POA: Insufficient documentation

## 2014-11-04 LAB — CBC WITH DIFFERENTIAL/PLATELET
Basophils Absolute: 0 10*3/uL (ref 0.0–0.1)
Basophils Relative: 0 % (ref 0–1)
EOS ABS: 0.2 10*3/uL (ref 0.0–0.7)
EOS PCT: 4 % (ref 0–5)
HEMATOCRIT: 33.5 % — AB (ref 36.0–46.0)
Hemoglobin: 10.7 g/dL — ABNORMAL LOW (ref 12.0–15.0)
Lymphocytes Relative: 27 % (ref 12–46)
Lymphs Abs: 1.2 10*3/uL (ref 0.7–4.0)
MCH: 28.4 pg (ref 26.0–34.0)
MCHC: 31.9 g/dL (ref 30.0–36.0)
MCV: 88.9 fL (ref 78.0–100.0)
Monocytes Absolute: 0.6 10*3/uL (ref 0.1–1.0)
Monocytes Relative: 13 % — ABNORMAL HIGH (ref 3–12)
Neutro Abs: 2.5 10*3/uL (ref 1.7–7.7)
Neutrophils Relative %: 56 % (ref 43–77)
PLATELETS: 205 10*3/uL (ref 150–400)
RBC: 3.77 MIL/uL — ABNORMAL LOW (ref 3.87–5.11)
RDW: 15.4 % (ref 11.5–15.5)
WBC: 4.5 10*3/uL (ref 4.0–10.5)

## 2014-11-04 LAB — COMPREHENSIVE METABOLIC PANEL
ALBUMIN: 2.9 g/dL — AB (ref 3.5–5.2)
ALT: 37 U/L — AB (ref 0–35)
AST: 41 U/L — AB (ref 0–37)
Alkaline Phosphatase: 61 U/L (ref 39–117)
Anion gap: 7 (ref 5–15)
BUN: 6 mg/dL (ref 6–23)
CHLORIDE: 110 mmol/L (ref 96–112)
CO2: 24 mmol/L (ref 19–32)
CREATININE: 0.93 mg/dL (ref 0.50–1.10)
Calcium: 8.5 mg/dL (ref 8.4–10.5)
GFR calc Af Amer: 61 mL/min — ABNORMAL LOW (ref >90)
GFR, EST NON AFRICAN AMERICAN: 53 mL/min — AB (ref >90)
Glucose, Bld: 105 mg/dL — ABNORMAL HIGH (ref 70–99)
Potassium: 3.7 mmol/L (ref 3.5–5.1)
SODIUM: 141 mmol/L (ref 135–145)
Total Bilirubin: 0.4 mg/dL (ref 0.3–1.2)
Total Protein: 5.2 g/dL — ABNORMAL LOW (ref 6.0–8.3)

## 2014-11-04 LAB — GLUCOSE, CAPILLARY
GLUCOSE-CAPILLARY: 113 mg/dL — AB (ref 70–99)
GLUCOSE-CAPILLARY: 86 mg/dL (ref 70–99)
Glucose-Capillary: 105 mg/dL — ABNORMAL HIGH (ref 70–99)

## 2014-11-04 MED ORDER — AMLODIPINE BESYLATE 2.5 MG PO TABS
2.5000 mg | ORAL_TABLET | Freq: Every day | ORAL | Status: DC
  Administered 2014-11-04: 2.5 mg via ORAL
  Filled 2014-11-04: qty 1

## 2014-11-04 NOTE — Clinical Documentation Improvement (Signed)
Potassium 3.1 11/03/14; potassium 10 mEq IV x 3 ordered.  Please identify and document any clinical conditions associated with the abnormal potassium level and carry over to the discharge summary.  Possible Clinical Conditions: -hypokalemia -other condition (please specify) -unable to determine at present  Thank you, Mateo Flow, RN [phone] Clinical Documentation Specialist

## 2014-11-04 NOTE — Plan of Care (Signed)
Problem: Acute Rehab PT Goals(only PT should resolve) Goal: Patient Will Transfer Sit To/From Stand And correct hand placement

## 2014-11-04 NOTE — Discharge Summary (Signed)
Physician Discharge Summary  Alicia [name]:[id number] DOB: 09/15/1922 DOA: 10/30/2014  PCP: Shellia Carwin, PA-C  Admit date: 10/30/2014 Discharge date: 11/04/2014  Recommendations for Outpatient Follow-up:  1. Pt will need to follow up with PCP in 2 weeks post discharge  Discharge Diagnoses:  Intractable vomiting/abdominal pain-->chronic PSBO -Patient has had a history of recurrent SBO in the past-this is her third admit in 6 months for similar presentation -CT abdomen and pelvis with oral contrast only-->PSBO with question of endoluminal mass -2/20 AXR--some improvement in PSBO -11/02/14--tolerating clear liquids -11/02/14 discussed with general surgery and GI-->order CT enterography with oral contrast--with temporary NG placement for the contrast -Certainly, the patient's large hiatus hernia may be contributing to her vomiting -Continue IV fluids -appreciate general surgery followup -pt also has rash to IV dye so she would need prep with prednisone if IV contrast is given -discussed case with radiologist, Dr. Patrica Duel do SBFT if CT enterography was to be without IV contrast. If SBFT is non-diagnostic, then will discuss with family risks/benefits of CT enterography WITH IV contrast (renal function is improving) -11/03/14--SBFT--nondiagnostic for endoluminal mass -Long discussion with the patient's family at the bedside to discuss the risks, benefits, and alternatives of CT enterography including possible anaphylaxis to IV dye--> they initially wanted to pursue this study -However, after speaking with general surgery, the patient's family decided to take a more conservative approach. They stated that they didn't want any surgical intervention regardless of the underlying cause of the patient's recurrent bowel obstructions (mass vs adhesions) -As result, CT enterography was discontinued -The patient's diet was advanced to soft diet which the patient tolerated. -Further history  revealed that the patient's daughter-in-law was giving the patient a high fiber diet which may have contributed to the small bowel obstructions. She was instructed to provide a soft, low fiber diet after discharge. Hypertension -restarted amlodipine Hypokalemia -repleted Diabetes mellitus type 2 -Diet controlled -Hemoglobin A1c--6.3 -Given the patient's age and comorbidities, allow for liberal glycemic control CKD stage III -Baseline creatinine 1.0-1.4 Dementia -restart Namenda when able to take po HLD -hold atorvastatin for now -restart when able to take po  Discharge Condition: stable  Disposition:  Follow-up Information    Follow up with Shellia Carwin, PA-C In 1 week.   Specialty:  Physician Assistant   Contact information:   [place] [address] [phone]       Diet:soft/low fiber Wt Readings from Last 3 Encounters:  11/02/14 62.1 kg (136 lb 14.5 oz)  10/30/14 62.143 kg (137 lb)  09/18/14 60.419 kg (133 lb 3.2 oz)    History of present illness:  81 year old female with a history of dementia, parkinsonism, recurrent small bowel obstruction, ovarian cancer, hypertension, PSVT, and diet-controlled diabetes mellitus presents with one-day history of vomiting and abdominal pain. The patient is unable to provide any history due to her advanced dementia. History is obtained from the patient's daughter-in-law, Kennetha Pearman. Apparently, the patient has been in her usual state of health until 10/30/2014 when she ate a pulled pork sandwich. She began choking on it and then ended up having vomiting. The patient was brought to the emergency department where she was given clear liquids. She ended up having more vomiting. There's been no reports of fevers, chills, chest pain, shortness breath, diarrhea, abdominal pain, dysuria. CT of the abdomen and pelvis revealed another small bowel obstruction. There was also question of an endoluminal mass. After numerous  discussions with the patient's family, a small bowel  follow-through was performed. This was negative. Initially, a decision was made to pursue a CT enterography to clarify the endoluminal mass. However after discussion with general surgery and the family, the family decided they did not want to pursue any surgery regardless of whether it was a mass or adhesions causing the patient's bowel obstruction. As result, the CT enterography was discontinued. The patient's diet was advanced which she tolerated without any further vomiting or abdominal pain. The patient was discharged on a soft low fiber diet.    Consultants: General surgery [facility]  Discharge Exam: Filed Vitals:   11/04/14 0950  BP: 183/62  Pulse: 65  Temp: 98.2 F (36.8 C)  Resp: 16   Filed Vitals:   11/03/14 1703 11/03/14 2204 11/04/14 0414 11/04/14 0950  BP: 176/75 141/82 151/57 183/62  Pulse: 68 75 73 65  Temp: 98.2 F (36.8 C) 98.5 F (36.9 C) 98.1 F (36.7 C) 98.2 F (36.8 C)  TempSrc: Oral Oral Oral Oral  Resp: 17 16 16 16   Height:      Weight:      SpO2: 97% 96% 100% 100%   General: Awake and alert, NAD, pleasant, cooperative Cardiovascular: RRR, no rub, no gallop, no S3 Respiratory: CTAB, no wheeze, no rhonchi Abdomen:soft, nontender, nondistended, positive bowel sounds Extremities: No edema, No lymphangitis, no petechiae  Discharge Instructions      Discharge Instructions    Diet - low sodium heart healthy    Complete by:  As directed      Increase activity slowly    Complete by:  As directed             Medication List    TAKE these medications        amLODipine 2.5 MG tablet  Commonly known as:  NORVASC  Take 2.5 mg by mouth daily.     aspirin EC 81 MG tablet  Take 81 mg by mouth daily.     atorvastatin 20 MG tablet  Commonly known as:  LIPITOR  Take 20 mg by mouth at bedtime.     CALTRATE 600+D PO  Take 600 mg by mouth at bedtime.     docusate sodium 100 MG capsule  Commonly  known as:  COLACE  Take 100 mg by mouth 2 (two) times daily.     loratadine 10 MG tablet  Commonly known as:  CLARITIN  Take 10 mg by mouth daily.     Memantine HCl ER 21 MG Cp24  Commonly known as:  NAMENDA XR  Take 21 mg by mouth daily.     multivitamin with minerals Tabs tablet  Take 1 tablet by mouth daily. Centrum Silver     nitrofurantoin (macrocrystal-monohydrate) 100 MG capsule  Commonly known as:  MACROBID  Take 100 mg by mouth at bedtime. Continuous course (hold while on antibiotics)     PROAIR RESPICLICK 284 (90 BASE) MCG/ACT Aepb  Generic drug:  Albuterol Sulfate  Inhale 2 puffs into the lungs every 4 (four) hours as needed (for shortness of breath).     sertraline 50 MG tablet  Commonly known as:  ZOLOFT  Take 50 mg by mouth daily.         The results of significant diagnostics from this hospitalization (including imaging, microbiology, ancillary and laboratory) are listed below for reference.    Significant Diagnostic Studies: Ct Abdomen Pelvis Wo Contrast  10/31/2014   CLINICAL DATA:  Abdominal pain. History of small bowel obstructions.  EXAM: CT ABDOMEN AND PELVIS  WITHOUT CONTRAST  TECHNIQUE: Multidetector CT imaging of the abdomen and pelvis was performed following the standard protocol without IV contrast.  COMPARISON:  CT 09/18/2014, plain films 10/31/2014  FINDINGS: Lower chest: Mild atelectasis at the lung bases is increased compared to prior. Right lower lobe atelectasis is more dense than the left.  Hepatobiliary: Non IV contrast images demonstrate no focal hepatic lesion. The gallbladder is normal volume.  Pancreas: Pancreas is normal. No ductal dilatation. No pancreatic inflammation.  Spleen: Normal spleen.  Adrenals/urinary tract: Adrenal glands are normal. No nephrolithiasis or ureterolithiasis. No obstructive uropathy.  Stomach/Bowel: The stomach is distended. There is a large hiatal hernia. The majority of the oral contrast remains within the stomach.  The duodenum is normal caliber. The proximal jejunum is normal caliber. The distal ileum is dilated over a long segment involving the near entirety of the ileum. Segments are dilated up to 3.2 cm. There are several air-fluid levels. There is a smooth transition to normal caliber distal ileum leading up to the terminal ileum seen on images 44 through 52 of the axial series and clearly demonstrated on images 35 through 46 of the coronal data set. The distal ileum leading up to terminal ileum measures 15 mm while the ileum proximal to this transition Mmeasures 33 mm. This pattern of caliber change is very similar to comparison CT of 09/18/2014. No clear evidence of adhesions. Potential endoluminal lesion on coronal image 35, series 202) and axial image 45. There is no pneumatosis or portal venous gas. No free fluid in the head or pelvis.  The ascending, transverse, and descending colon relatively collapsed. There is small amount of stool in the rectum. There scattered diverticula throughout the colon without acute inflammation.  Vascular/Lymphatic: Abdominal aorta is normal caliber with atherosclerotic calcification. There is no retroperitoneal or periportal lymphadenopathy. No pelvic lymphadenopathy.  Reproductive: Post hysterectomy  Musculoskeletal: No aggressive osseous lesion.  IMPRESSION: 1. Chronic partial mechanical small bowel obstruction with caliber change in the distal ileum approximately 8 cm from the terminal ileum. Potential endoluminal lesion identified but poorly characterized without IV contrast. Recommend CT enterography or capsule optical evaluation. 2. Colonic diverticulosis without acute diverticulitis. 3. Increasing basilar atelectasis more dense on the right. These results will be called to the ordering clinician or representative by the Radiologist Assistant, and communication documented in the PACS or zVision Dashboard.   Electronically Signed   By: Suzy Bouchard M.D.   On: 10/31/2014 12:34     Dg Chest 2 View  10/30/2014   CLINICAL DATA:  Chest pain post choking episode while eating a pulled pork sandwhich, was able to cough it up, single episode of vomiting follow by substernal chest pain  EXAM: CHEST  2 VIEW  COMPARISON:  10/17/2014  FINDINGS: Loop recorder projects over cardiac silhouette.  Upper normal heart size.  Normal mediastinal contours and pulmonary vascularity.  Atherosclerotic calcification aorta.  Minimal bibasilar atelectasis.  Lungs otherwise clear.  No pleural effusion or pneumothorax.  Bones demineralized.  IMPRESSION: Minimal bibasilar atelectasis.   Electronically Signed   By: Lavonia Dana M.D.   On: 10/30/2014 22:21   Dg Abd 1 View  10/31/2014   CLINICAL DATA:  Emesis  EXAM: ABDOMEN - 1 VIEW  COMPARISON:  None.  FINDINGS: The bowel gas pattern is normal. No radio-opaque calculi or other significant radiographic abnormality are seen.  IMPRESSION: Negative.   Electronically Signed   By: Andreas Newport M.D.   On: 10/31/2014 01:51   Dg Small Bowel  11/03/2014   CLINICAL DATA:  History of small bowel obstruction. Possible intraluminal mass by CT scan.  EXAM: SMALL BOWEL SERIES  COMPARISON:  CT abdomen and pelvis 10/31/2014.  TECHNIQUE: Following ingestion of thin barium, serial small bowel images were obtained including spot views of the terminal ileum.  FLUOROSCOPY TIME:  Radiation Exposure Index (as provided by the fluoroscopic device):  If the device does not provide the exposure index:  Fluoroscopy Time (in minutes and seconds):  24 seconds  Number of Acquired Images:  4  FINDINGS: Scout film demonstrates contrast from the patient's CT scan in the colon with scattered diverticula. The small bowel demonstrates normal caliber throughout. Transit time to the colon was 15 min. Potential endoluminal mass described on report of CT scan is not visible on this examination. No evidence of inflammatory process is identified.  IMPRESSION: Negative for small bowel obstruction. No  endoluminal mass is identified. Small bowel follow-through is not sensitive for the detection of endoluminal mass and CT enterography or capsule endoscopy is recommended for further evaluation.  Diverticulosis.   Electronically Signed   By: Inge Rise M.D.   On: 11/03/2014 10:17   Dg Abd 2 Views  11/01/2014   CLINICAL DATA:  81 year old female with a history of small bowel obstruction.  EXAM: ABDOMEN - 2 VIEW  COMPARISON:  None.  FINDINGS: Enteric contrast, present on the prior CT, has traversed the colon. No evidence of colonic distention. Multiple colonic diverticula. Small central small bowel gas, measures borderline enlarged.  No definite free air.  Multiple surgical clips within the abdomen  No unexpected calcifications.  No displaced fracture.  IMPRESSION: Enteric contrast has a traversed the GI system from the stomach through the colon on today's study, with no indication of a complete small bowel obstruction.  Diverticular disease.  Surgical clips within the abdomen.   Electronically Signed   By: Corrie Mckusick D.O.   On: 11/01/2014 08:03     Microbiology: Recent Results (from the past 240 hour(s))  Urine culture     Status: None   Collection Time: 10/31/14 10:54 AM  Result Value Ref Range Status   Specimen Description URINE, CATHETERIZED  Final   Special Requests NONE  Final   Colony Count NO GROWTH Performed at [facility]   Final   Culture NO GROWTH Performed at [facility]   Final   Report Status 11/01/2014 FINAL  Final     Labs: Basic Metabolic Panel:  Recent Labs Lab 10/31/14 0540 11/01/14 0907 11/02/14 0616 11/03/14 0622 11/04/14 0542  NA 136 137 139 138 141  K 4.1 3.9 3.7 3.1* 3.7  CL 101 108 109 107 110  CO2 29 21 24 26 24   GLUCOSE 168* 100* 92 104* 105*  BUN 21 17 11 7 6   CREATININE 1.42* 1.03 0.88 1.03 0.93  CALCIUM 8.7 8.2* 8.3* 8.2* 8.5   Liver Function Tests:  Recent Labs Lab 10/31/14 0540 11/01/14 0907 11/02/14 0616  11/03/14 0622 11/04/14 0542  AST 22 21 19 23  41*  ALT 15 14 14 18  37*  ALKPHOS 70 68 64 60 61  BILITOT 0.4 0.4 0.4 0.4 0.4  PROT 6.0 5.6* 5.5* 5.2* 5.2*  ALBUMIN 3.1* 3.0* 2.9* 2.6* 2.9*    Recent Labs Lab 10/30/14 2137  LIPASE 36   No results for input(s): AMMONIA in the last 168 hours. CBC:  Recent Labs Lab 10/31/14 0540 11/01/14 0907 11/02/14 0616 11/03/14 0622 11/04/14 0542  WBC 9.7 6.3 5.7 4.3  4.5  NEUTROABS 8.5* 4.4 3.7 2.6 2.5  HGB 11.6* 11.5* 11.5* 10.6* 10.7*  HCT 35.9* 35.0* 35.3* 32.1* 33.5*  MCV 88.6 88.6 89.8 87.5 88.9  PLT 222 202 194 197 205   Cardiac Enzymes: No results for input(s): CKTOTAL, CKMB, CKMBINDEX, TROPONINI in the last 168 hours. BNP: Invalid input(s): POCBNP CBG:  Recent Labs Lab 11/03/14 1700 11/03/14 2003 11/04/14 0012 11/04/14 0412 11/04/14 0738  GLUCAP 85 119* 86 105* 113*    Time coordinating discharge:  Greater than 30 minutes  Signed:  Jahmai Finelli, DO [facility] Pager: [phone] 11/04/2014, 12:15 PM

## 2014-11-04 NOTE — Progress Notes (Signed)
Subjective: No abdominal pain. Reports that she is tolerating her diet.  Objective: Vital signs in last 24 hours: Temp:  [98.1 F (36.7 C)-98.5 F (36.9 C)] 98.1 F (36.7 C) (02/23 0414) Pulse Rate:  [68-75] 73 (02/23 0414) Resp:  [16-17] 16 (02/23 0414) BP: (141-176)/(57-82) 151/57 mmHg (02/23 0414) SpO2:  [96 %-100 %] 100 % (02/23 0414) Weight change:     PE: GEN:  NAD, resting comfortably. ABD:  Soft, very mild tympany, non-distended, non-tender  Lab Results: CBC    Component Value Date/Time   WBC 4.5 11/04/2014 0542   RBC 3.77* 11/04/2014 0542   HGB 10.7* 11/04/2014 0542   HCT 33.5* 11/04/2014 0542   PLT 205 11/04/2014 0542   MCV 88.9 11/04/2014 0542   MCH 28.4 11/04/2014 0542   MCHC 31.9 11/04/2014 0542   RDW 15.4 11/04/2014 0542   LYMPHSABS 1.2 11/04/2014 0542   MONOABS 0.6 11/04/2014 0542   EOSABS 0.2 11/04/2014 0542   BASOSABS 0.0 11/04/2014 0542   CMP     Component Value Date/Time   NA 141 11/04/2014 0542   K 3.7 11/04/2014 0542   CL 110 11/04/2014 0542   CO2 24 11/04/2014 0542   GLUCOSE 105* 11/04/2014 0542   BUN 6 11/04/2014 0542   CREATININE 0.93 11/04/2014 0542   CALCIUM 8.5 11/04/2014 0542   PROT 5.2* 11/04/2014 0542   ALBUMIN 2.9* 11/04/2014 0542   AST 41* 11/04/2014 0542   ALT 37* 11/04/2014 0542   ALKPHOS 61 11/04/2014 0542   BILITOT 0.4 11/04/2014 0542   GFRNONAA 53* 11/04/2014 0542   GFRAA 61* 11/04/2014 0542   STUDIES:  Small bowel follow-through 11/03/14, which I have personally reviewed; no evidence of small bowel obstruction.  Assessment:  1.  Recurrent partial small bowel obstructions, most recent of which seems to have clinically resolved. 2.  Advanced age with dementia.  Plan:  1.  Surgery notes reviewed; patient's family does not want surgery regardless of the underlying cause of her recurrent obstructions (mass versus adhesions).  Accordingly, medical management is being pursued, and CT enterography was canceled. 2.   Advancing of diet per surgical team. 3.  No role for any further GI testing at the present time. 4.  Will sign-off; please call with questions; thank you for the consult.   Alicia [name] 11/04/2014, 8:10 AM

## 2014-11-04 NOTE — Evaluation (Signed)
Physical Therapy Evaluation Patient Details Name: Alicia [name] MRN: [id number] DOB: [birth year] Today's Date: 11/04/2014   History of Present Illness  81 YO female with onset of emesis with recent chemotherapy and ovarian CA, PMHx:  Parkinsons, SBO, dementia  Clinical Impression  Pt was seen for initial evaluation with increasing effort to walk on hallway for her.  Plan is not determined although recent history indicates family will care for her at home.  Her current level is a high fall risk and would benefit from SNF to increase safe independent transfers and gait.   Pt is not able to consent this plan of care.    Follow Up Recommendations SNF;Supervision/Assistance - 24 hour    Equipment Recommendations  Other (comment);None recommended by PT (Will need family information to determine)    Recommendations for Other Services       Precautions / Restrictions Precautions Precautions: Fall Precaution Comments: using fall alarms in chair Restrictions Weight Bearing Restrictions: No      Mobility  Bed Mobility Overal bed mobility: Needs Assistance Bed Mobility: Supine to Sit     Supine to sit: Mod assist     General bed mobility comments: using bed pad to fully scoot out on bed  Transfers Overall transfer level: Needs assistance Equipment used: 1 person hand held assist (IV pole) Transfers: Sit to/from Stand;Stand Pivot Transfers Sit to Stand: Min assist Stand pivot transfers: Min assist       General transfer comment: pt follows verbal and tactile cues with IV pole for support  Ambulation/Gait Ambulation/Gait assistance: Min assist;Mod assist Ambulation Distance (Feet): 125 Feet Assistive device: 1 person hand held assist (IV pole) Gait Pattern/deviations: Step-to pattern;Decreased dorsiflexion - right;Decreased dorsiflexion - left;Shuffle;Decreased weight shift to right;Decreased weight shift to left;Drifts right/left;Wide base of support Gait velocity:  reduced Gait velocity interpretation: Below normal speed for age/gender General Gait Details: No signs of classic festination when initating gait  Stairs            Wheelchair Mobility    Modified Rankin (Stroke Patients Only)       Balance Overall balance assessment: Needs assistance Sitting-balance support: Feet supported Sitting balance-Leahy Scale: Fair   Postural control: Posterior lean Standing balance support: Bilateral upper extremity supported Standing balance-Leahy Scale: Poor Standing balance comment: Pt really is relying on IV pole and PT to assist                             Pertinent Vitals/Pain Pain Assessment: No/denies pain Faces Pain Scale: No hurt    Home Living Family/patient expects to be discharged to:: Unsure                 Additional Comments: Pt lives [place] and daughter [place]    Prior Function Level of Independence: Needs assistance   Gait / Transfers Assistance Needed: no PLOF on chart     Comments: unknown but prior chart reviews indicate that patient has RW and has received assistance with ADLs at times     Hand Dominance   Dominant Hand: Right    Extremity/Trunk Assessment   Upper Extremity Assessment: Overall WFL for tasks assessed           Lower Extremity Assessment: Generalized weakness      Cervical / Trunk Assessment: Normal  Communication   Communication: No difficulties  Cognition Arousal/Alertness: Awake/alert Behavior During Therapy: WFL for tasks assessed/performed Overall Cognitive Status: History of cognitive  impairments - at baseline       Memory: Decreased recall of precautions;Decreased short-term memory              General Comments General comments (skin integrity, edema, etc.): pt is having diffculty with knowing how unsafe she might be to try to stand.  Her chair is fitted with pad to alarm as she is capable of getting up enough to fall    Exercises         Assessment/Plan    PT Assessment Patient needs continued PT services  PT Diagnosis Difficulty walking   PT Problem List Decreased strength;Decreased range of motion;Decreased activity tolerance;Decreased balance;Decreased mobility;Decreased coordination;Decreased cognition;Decreased knowledge of use of DME;Decreased safety awareness;Cardiopulmonary status limiting activity (telemetry)  PT Treatment Interventions DME instruction;Gait training;Functional mobility training;Therapeutic activities;Balance training;Therapeutic exercise;Neuromuscular re-education;Cognitive remediation;Patient/family education   PT Goals (Current goals can be found in the Care Plan section) Acute Rehab PT Goals Patient Stated Goal: none stated PT Goal Formulation: Patient unable to participate in goal setting Time For Goal Achievement: 11/18/14 Potential to Achieve Goals: Good    Frequency Min 3X/week   Barriers to discharge Other (comment) (not fully aware of home environment as family not here) Need family to inform dc decisions    Co-evaluation               End of Session Equipment Utilized During Treatment: Gait belt Activity Tolerance: Patient tolerated treatment well Patient left: in chair;with call bell/phone within reach;with chair alarm set Nurse Communication: Mobility status         Time: 1572-6203 PT Time Calculation (min) (ACUTE ONLY): 31 min   Charges:   PT Evaluation $Initial PT Evaluation Tier I: 1 Procedure PT Treatments $Gait Training: 8-22 mins   PT G CodesRamond Dial 11/05/14, 11:24 AM   Mee Hives, PT MS Acute Rehab Dept. Number: [phone]

## 2014-11-04 NOTE — Progress Notes (Signed)
Occupational Therapy Evaluation Patient Details Name: Alicia [name] MRN: [id number] DOB: 13-Jul-1923 Today's Date: 11/04/2014    History of Present Illness 81 y.o. year old female with significant past medical history of dementia, parkinson, hx/o SBO, hx/o ovarian cancer s/p surgery and chemo tx presenting with emesis.    Clinical Impression   Patient presents requiring assistance with all ADLs. Unclear what her baseline is, and no family is present. Will follow from OT to maximize independence and safety with ADLs and to facilitate a safe discharge.    Follow Up Recommendations  Supervision/Assistance - 24 hour;Home health OT (if family willing to provide this level of assistance; if not, then SNF)   Equipment Recommendations  3 in 1 bedside comode    Recommendations for Other Services PT consult     Precautions / Restrictions Precautions Precautions: Fall Restrictions Weight Bearing Restrictions: No      Mobility Bed Mobility                  Transfers                      Balance                                            ADL Overall ADL's : Needs assistance/impaired Eating/Feeding: Minimal assistance;Sitting Eating/Feeding Details (indicate cue type and reason): assistance to open containers, min spillage due to tremors Grooming: Wash/dry hands;Wash/dry face;Set up;Sitting                                 General ADL Comments: Patient asleep upon arrival, awakens to voice. Minimally conversive. Sat EOB for simple grooming and feeding activities. RUE tremors noted, min spillage with self-feeding and needed assistance opening all containers, applying condiments, etc. Patient washed face and hands with setup. Min spillage drink from cup due to tremors. Supine to sit and sit to supine min A overall.     Vision     Perception     Praxis      Pertinent Vitals/Pain Pain Assessment: Faces Faces Pain Scale: No  hurt     Hand Dominance Right   Extremity/Trunk Assessment Upper Extremity Assessment Upper Extremity Assessment: Overall WFL for tasks assessed (patient has splint on LUE to protect IV; note tremors RUE )   Lower Extremity Assessment Lower Extremity Assessment: Defer to PT evaluation       Communication Communication Communication: No difficulties (minimally conversive)   Cognition Arousal/Alertness: Awake/alert Behavior During Therapy: WFL for tasks assessed/performed Overall Cognitive Status: History of cognitive impairments - at baseline       Memory: Decreased short-term memory             General Comments       Exercises       Shoulder Instructions      Home Living Family/patient expects to be discharged to:: Unsure                                 Additional Comments: Pt lives with [place] and daughter [place].      Prior Functioning/Environment Level of Independence: Needs assistance        Comments: unknown but prior chart reviews indicate that patient has  RW and has received assistance with ADLs at times    OT Diagnosis: Generalized weakness;Cognitive deficits   OT Problem List: Decreased strength;Decreased activity tolerance;Decreased cognition;Decreased safety awareness   OT Treatment/Interventions: Self-care/ADL training;DME and/or AE instruction;Therapeutic activities;Patient/family education    OT Goals(Current goals can be found in the care plan section) Acute Rehab OT Goals Patient Stated Goal: none stated OT Goal Formulation: Patient unable to participate in goal setting Time For Goal Achievement: 11/11/14 Potential to Achieve Goals: Fair  OT Frequency: Min 2X/week   Barriers to D/C:            Co-evaluation              End of Session    Activity Tolerance: Patient tolerated treatment well Patient left: in bed;with call bell/phone within reach;with bed alarm set   Time: 9563-8756 OT Time Calculation (min):  24 min Charges:  OT General Charges $OT Visit: 1 Procedure OT Evaluation $Initial OT Evaluation Tier I: 1 Procedure OT Treatments $Self Care/Home Management : 8-22 mins G-Codes:    Myrna Vonseggern A 11-12-14, 9:51 AM

## 2014-11-04 NOTE — Progress Notes (Signed)
CARE MANAGEMENT NOTE 11/04/2014  Patient:  Alicia [name], Alicia [name]   Account Number:  [id number]  Date Initiated:  11/03/2014  Documentation initiated by:  [facility]  Subjective/Objective Assessment:   chronic partial small obstruction     Action/Plan:   home with family   Anticipated DC Date:  11/04/2014   Anticipated DC Plan:  [place]  CM consult      [facility] Choice  HOME HEALTH  Resumption Of Svcs/PTA Provider   Choice offered to / List presented to:  C-4 Adult Children        HH arranged  HH-1 RN  [place].   Status of service:  Completed, signed off Medicare Important Message given?  YES (If response is "NO", the following Medicare IM given date fields will be blank) Date Medicare IM given:  11/03/2014 Medicare IM given by:  [facility] Date Additional Medicare IM given:   Additional Medicare IM given by:    Discharge Disposition:  [place]  Per UR Regulation:  Reviewed for med. necessity/level of care/duration of stay  If discussed at [place] of Stay Meetings, dates discussed:   11/04/2014    Comments:  11/04/2014 1430 NCM notified [facility] of scheduled dc home today with [facility] RN/PT. Per [employer], pt is active with [facility] RN. Attempted call to dtr-in-law, no message.  Jonnie Finner RN CCM Case Mgmt [phone]  11/03/2014 1500 NCM spoke to son, Alicia [name] and wanted NCM to speak with his wife, Alicia [name] # [phone]. Wife states pt has RW at home. States her endurance is poor and they are requesting a light weight wheelchair, but she just received a RW last covered by her insurance. Waiting final recommendations for home. Dtr-in-law is requesting PT eval pt prior to dc. Attending notified. Jonnie Finner RN CCM Case Mgmt phone [phone]

## 2014-11-04 NOTE — Progress Notes (Signed)
Pt and family provided with discharge information including instruction on follow up appointments, home health contact information, and medications. Pt's daughter verbalized understanding of all information. IV was DC'd without complication. VSS. Pt escorted out via wheelchair by this RN.  Tyna Jaksch, RN

## 2014-11-04 NOTE — Progress Notes (Signed)
Patient ID: Alicia [name], female   DOB: [birth year], 81 y.o.   MRN: [id number]    Subjective: Pt resting.  Denies nausea.  Hasn't eaten yet this morning.  Objective: Vital signs in last 24 hours: Temp:  [98.1 F (36.7 C)-98.5 F (36.9 C)] 98.1 F (36.7 C) (02/23 0414) Pulse Rate:  [68-75] 73 (02/23 0414) Resp:  [16-17] 16 (02/23 0414) BP: (141-176)/(57-82) 151/57 mmHg (02/23 0414) SpO2:  [96 %-100 %] 100 % (02/23 0414)    Intake/Output from previous day: 2022/12/01 0701 - 02/23 0700 In: 953.3 [P.O.:60; I.V.:893.3] Out: 375 [Urine:375] Intake/Output this shift:    PE: Abd: soft, Nt, ND, +BS  Lab Results:   Recent Labs  Dec 01, 2014 0622 11/04/14 0542  WBC 4.3 4.5  HGB 10.6* 10.7*  HCT 32.1* 33.5*  PLT 197 205   BMET  Recent Labs  2014-12-01 0622 11/04/14 0542  NA 138 141  K 3.1* 3.7  CL 107 110  CO2 26 24  GLUCOSE 104* 105*  BUN 7 6  CREATININE 1.03 0.93  CALCIUM 8.2* 8.5   PT/INR No results for input(s): LABPROT, INR in the last 72 hours. CMP     Component Value Date/Time   NA 141 11/04/2014 0542   K 3.7 11/04/2014 0542   CL 110 11/04/2014 0542   CO2 24 11/04/2014 0542   GLUCOSE 105* 11/04/2014 0542   BUN 6 11/04/2014 0542   CREATININE 0.93 11/04/2014 0542   CALCIUM 8.5 11/04/2014 0542   PROT 5.2* 11/04/2014 0542   ALBUMIN 2.9* 11/04/2014 0542   AST 41* 11/04/2014 0542   ALT 37* 11/04/2014 0542   ALKPHOS 61 11/04/2014 0542   BILITOT 0.4 11/04/2014 0542   GFRNONAA 53* 11/04/2014 0542   GFRAA 61* 11/04/2014 0542   Lipase     Component Value Date/Time   LIPASE 36 10/30/2014 2137       Studies/Results: Dg Small Bowel  2014-12-01   CLINICAL DATA:  History of small bowel obstruction. Possible intraluminal mass by CT scan.  EXAM: SMALL BOWEL SERIES  COMPARISON:  CT abdomen and pelvis 10/31/2014.  TECHNIQUE: Following ingestion of thin barium, serial small bowel images were obtained including spot views of the terminal ileum.  FLUOROSCOPY TIME:   Radiation Exposure Index (as provided by the fluoroscopic device):  If the device does not provide the exposure index:  Fluoroscopy Time (in minutes and seconds):  24 seconds  Number of Acquired Images:  4  FINDINGS: Scout film demonstrates contrast from the patient's CT scan in the colon with scattered diverticula. The small bowel demonstrates normal caliber throughout. Transit time to the colon was 15 min. Potential endoluminal mass described on report of CT scan is not visible on this examination. No evidence of inflammatory process is identified.  IMPRESSION: Negative for small bowel obstruction. No endoluminal mass is identified. Small bowel follow-through is not sensitive for the detection of endoluminal mass and CT enterography or capsule endoscopy is recommended for further evaluation.  Diverticulosis.   Electronically Signed   By: Inge Rise M.D.   On: 12/01/2014 10:17    Anti-infectives: Anti-infectives    None       Assessment/Plan  1. Chronic partial small bowel obstruction, endoluminal mass vs adhesions 2. Advanced dementia  Plan: 1. Patient with no family present this morning, but appears to have tolerated her full liquids well yesterday.  Hasn't eaten yet this morning.  As long as she tolerates her diet today, she is stable for dc home from our  standpoint.  I have discussed with the family about a low fiber/soft diet for the patient at home to help with ease of passage and digestion.  No pursuit of surgical intervention or PEG tube placement is wanted.  We will sign off.     LOS: 4 days    Maycie Luera E 11/04/2014, 8:31 AM Pager: [phone]

## 2014-12-08 ENCOUNTER — Telehealth: Payer: Self-pay | Admitting: *Deleted

## 2014-12-08 NOTE — Telephone Encounter (Signed)
Patient Long term care form from [facility] at the front desk for pick up. Please give form to [name] or [place].

## 2015-05-06 ENCOUNTER — Ambulatory Visit (INDEPENDENT_AMBULATORY_CARE_PROVIDER_SITE_OTHER): Admitting: Neurology

## 2015-05-06 ENCOUNTER — Encounter: Payer: Self-pay | Admitting: Neurology

## 2015-05-06 VITALS — BP 88/52 | HR 64 | Resp 14 | Ht 62.0 in | Wt 128.0 lb

## 2015-05-06 DIAGNOSIS — Z9289 Personal history of other medical treatment: Secondary | ICD-10-CM

## 2015-05-06 DIAGNOSIS — K5669 Other intestinal obstruction: Secondary | ICD-10-CM | POA: Diagnosis not present

## 2015-05-06 DIAGNOSIS — Z87898 Personal history of other specified conditions: Secondary | ICD-10-CM | POA: Diagnosis not present

## 2015-05-06 DIAGNOSIS — R5381 Other malaise: Secondary | ICD-10-CM

## 2015-05-06 DIAGNOSIS — F0390 Unspecified dementia without behavioral disturbance: Secondary | ICD-10-CM

## 2015-05-06 DIAGNOSIS — K56600 Partial intestinal obstruction, unspecified as to cause: Secondary | ICD-10-CM

## 2015-05-06 DIAGNOSIS — E46 Unspecified protein-calorie malnutrition: Secondary | ICD-10-CM

## 2015-05-06 DIAGNOSIS — F03C0 Unspecified dementia, severe, without behavioral disturbance, psychotic disturbance, mood disturbance, and anxiety: Secondary | ICD-10-CM

## 2015-05-06 DIAGNOSIS — R54 Age-related physical debility: Secondary | ICD-10-CM | POA: Diagnosis not present

## 2015-05-06 DIAGNOSIS — R6889 Other general symptoms and signs: Secondary | ICD-10-CM

## 2015-05-06 NOTE — Progress Notes (Signed)
Subjective:    Alicia [name] ID: Alicia Alicia [name] is a 81 y.o. female.  HPI     Interim history:   Alicia Alicia [name] is a very pleasant 81 year old right-handed woman with an underlying medical history of cancer, tremor, depression, anxiety, hyperlipidemia, syncope, weight loss and memory loss, who presents for followup consultation of Alicia memory loss and intermittent confusion. She is accompanied by Alicia Alicia [name], Alicia Alicia [name], again today. I last saw Alicia on 10/30/2014, at which time soon reported that Alicia Alicia [name] had a presyncopal spell while watching a ball game. She never completely lost consciousness. She was found to have severe obstipation. She had had more and more problems with constipation and recurrence of bowel impaction. She was drinking fluid and had a good appetite. Memory by she was noted to have a continual decline. She was seen a GI doctor at [facility]. She was diagnosed with diverticulosis.   Unfortunately, in Alicia interim, she was hospitalized from 10/30/2014 through 11/04/2014 for chest pain in a choking spell at home. She was brought in by EMS after she was found to begin choking on a sandwich.   Today, 05/06/2015: I reviewed Alicia hospital records including Alicia discharge summary from Alicia admission in February 2016 [facility]. She was found to have another episode of partial small bowel obstruction. She was primarily treated conservatively. She was placed on a soft low fiber diet upon discharge is instructed to restart Namenda when able to take by mouth. She had an EEG in Alicia past: in 5/15: This normal EEG is recorded in Alicia waking and sleep state. There was no seizure or seizure predisposition recorded on this study.  She has been at [facility] in March and has been in hospice since then, primary due to malnutrition. She is now on Ensure 3 a day, she does drink enough water. She is incontinent. She has been in hospice care since 11/22/14 and ready to be released from  them. According to Alicia Alicia [name] Alicia albumen level has improved. I reviewed Alicia emergency room records from [facility] from 04/30/2015. According to Alicia Alicia [name] they recommended neurology consult but family declined and Alicia [name] was sent home. I reviewed blood test results which were benign. Urinalysis showed cloudy urine with protein but no other abnormal findings. She had a CT head wo contrast on 04/30/15: 1.  No acute large vascular territory ischemic changes. Although no evidence of acute infarction, mass, or hemorrhage is seen, CT is relatively insensitive for Alicia detection of hypoxia/ischemia within Alicia first 24-48 hours, and an MRI scan may be indicated.       2.  Nonspecific white matter hypodensity, likely reflecting sequela of chronic microvascular ischemic disease (leukoaraiosis). 3.  Right mastoid effusion.    Alicia Alicia [name] feels that she is continuing to decline. She seems confused at times and has staring spells. She had 2 seizure-like spells last week. Both occurred at home. She was found to have tightening of Alicia muscles and twitching. She does have a prior history of fainting spells.  Previously:  She no showed for an appointment on 10/01/2014. I saw Alicia on 04/02/2014, at which time Alicia Alicia [name] reported a significant decline in Alicia cognitive function. She had urinary incontinence. She was sleeping more and eating a lot more. She was gaining weight. She had titrated up to Namenda long-acting 28 mg once daily. In Alicia past was not able to tolerate Aricept. In Alicia interim, she has had multiple emergency room visits and hospitalizations. I reviewed Alicia records in  [facility] health system. On 05/07/2014 she was taken to Alicia emergency room for weakness. No cause was found. On 05/28/2014 she was taken to Alicia emergency room for abdominal pain. On 06/11/2014 she was hospitalized till 06/12/2014 for chest pain. She was hospitalized from 06/13/2014 through 06/17/2014 for small bowel  obstruction. She went back to Alicia emergency room for nausea and vomiting on 06/19/2014 but did not have recurrence of small bowel obstruction. She was in Alicia emergency room on 08/10/2014 for a syncopal spell and was diagnosed and treated for urinary tract infection. She went to Alicia emergency room at 09/06/2014 for altered mental status and also on 09/10/2014 for altered mental status. She was admitted to Alicia hospital on 09/18/2014 and discharged on 09/20/2014 for small bowel obstruction.    I saw Alicia on 11/11/2013, at which time we talked about Alicia confusion and cognitive decline she has been off of Aricept we tried Alicia on Namenda XR, starting at 7 mg daily and I provided Alicia with a starter packet trial card. In Alicia interim, Alicia Alicia [name] was admitted to Alicia hospital twice. In April 2015 she was admitted with nausea, vomiting and fever. On 01/12/2014 she was taken to Alicia emergency room with altered mental status. She was not admitted to Alicia hospital. On 01/21/2014 she was admitted to Alicia hospital after syncopal spell. She was discharged to skilled nursing facility and was discharged from there in June and is now back to home with son, daughter in law and their 2 boys. They had a tough transition back home, but she seems stable now. She has been able to go up to Namenda XR 28 mg daily, gradually since June and while there is no significant cognitive improvement, she seems to be tolerating it. She saw Alicia urologist this morning, and is now again on cipro. She had a CTH wo contrast and a C spine CT on 01/12/14: No evidence of acute intracranial or acute cervical spine   abnormality. Atrophy and chronic small-vessel white matter ischemic changes. Moderate to severe degenerative disc disease from C3-C6. She had a brain MRI wo contrast on 12/25/13: Atrophy and small vessel disease. No acute intracranial findings.   I saw Alicia on 06/21/2013, at which time we lowered Alicia Aricept again to 5 mg, due to concern for  bradycardia. I also suggested we consider Namenda XR down Alicia road. At Alicia time, Alicia daughter in-law reported no recent issues with confusion or pre-syncope. She had low BP one night and she had low K and she was not given Alicia Aricept and Alicia lisinopril. She had also been sleep walking or had some confusion: She left Alicia house one day in Alicia early morning and they had to call 911 and she was found walking on a neighboring street and sat down in a neighbor's garage. She has needed more prompting with simple tasks. They had an aid daily from 11 AM to 2 PM, but Alicia aid quit on Alicia day of Alicia appointment in 10/14.   In Alicia interim, Alicia daughter called on 07/11/2013 reporting a syncopal spell at home. We had Alicia stop Alicia Aricept. In November 2014 she had an implantable loop monitor placed under Dr. Rayann Heman, with monitoring through Dr. Wynonia Lawman. She was seen in followup by Dr. Rayann Heman on 10/24/2013. She was started on low-dose amlodipine.    I saw Alicia on 03/12/2013, at which time I increased Alicia Aricept from 5 mg to 10 mg again. I first met Alicia on 12/10/2012 and  she previously followed with Dr. Morene Antu. MRI brain in June 2011 showed chronic microvascular ischemia and generalized atrophy, RPR and B12 were normal in June 2011. She was started on Aricept but discontinued Alicia medication and then re-started it. She was admitted to [facility] in 3/14 for an episode of unresponsiveness. At Alicia time of Alicia first visit I suggested an EEG as well as cardiology evaluation. I did not make any changes in Alicia medications. Alicia EEG was normal in Alicia awake state. She had a 30 day Holter, which was normal. She saw Dr. Tollie Eth. He lowered Alicia metoprolol to 25 mg, and she had discontinued Alicia amlodipine per PCP previously. Alicia Aricept had been decreased in Alicia past d/t concern of bradycardia.   Alicia Alicia [name] has had some AH and rare VH. They seem to be transformational. She has been sleeping fairly well, but at  times she wanders at night but is usually easily redirected.     Alicia Past Medical History Is Significant For: Past Medical History  Diagnosis Date  . Dementia     significant  . Renal disease   . Memory loss 12/10/2012  . Depression 12/10/2012  . Anxiety state, unspecified 12/10/2012  . Spells 12/10/2012  . Essential hypertension, benign 12/10/2012  . Other and unspecified hyperlipidemia 12/10/2012  . Loss of weight 12/10/2012  . Carotid artery stenosis   . Ovarian cancer     treated with surgery & chemo  . Ankle fracture, right   . Unstable gait     mild  . Syncope 12/10/2012    Recurrent, sporadic episodes  . Carotid artery disease   . Parkinson disease   . Diabetes mellitus without complication   . Diverticulosis     Alicia Past Surgical History Is Significant For: Past Surgical History  Procedure Laterality Date  . Vesicovaginal fistula closure w/ tah  2005  . Oophorectomy  2007    for ovarian cancer  . Prolapsed uterine fibroid ligation    . Appendectomy    . Cataract extraction Bilateral   . Implantable loop recorder  11/14    MDT LINQ implanted for recurrent unexplained syncope by Dr Rayann Heman  . Abdominal hysterectomy    . Ankle surgery Left   . Loop recorder implant N/A 07/17/2013    Procedure: LOOP RECORDER IMPLANT;  Surgeon: Coralyn Mark, MD;  Location: [facility]ATH [facility];  Service: Cardiovascular;  Laterality: N/A;    Alicia Family History Is Significant For: Family History  Problem Relation Age of Onset  . Cancer Mother     breast  . Stroke Father     Alicia Social History Is Significant For: Social History   Social History  . Marital Status: Married    Spouse Name: N/A  . Number of Children: N/A  . Years of Education: N/A   Social History Main Topics  . Smoking status: Never Smoker   . Smokeless tobacco: Never Used  . Alcohol Use: No  . Drug Use: No  . Sexual Activity: No   Other Topics Concern  . Not on file   Social History Narrative   Lives with son  and daughter in law    Alicia Allergies Are:  Allergies  Allergen Reactions  . Iohexol Rash  . Ativan [Lorazepam] Other (See Comments)    Causes agitation  . Ciprofloxacin Other (See Comments)    Weakness (possible reaction)  . Keflex [Cephalexin] Other (See Comments)    Weakness (possible reaction)  .  Nsaids Other (See Comments)    Renal insufficiency  . Sulfa Antibiotics Hives and Itching  . Ivp Dye [Iodinated Diagnostic Agents] Rash  :   Alicia Current Medications Are:  Outpatient Encounter Prescriptions as of 05/06/2015  Medication Sig  . Lactose POWD by Does not apply route.  . loratadine (CLARITIN) 10 MG tablet Take 10 mg by mouth daily.   Marland Kitchen omeprazole (PRILOSEC) 20 MG capsule Take 20 mg by mouth daily.  . sertraline (ZOLOFT) 50 MG tablet Take 50 mg by mouth daily.  . [DISCONTINUED] Albuterol Sulfate (PROAIR RESPICLICK) 604 (90 BASE) MCG/ACT AEPB Inhale 2 puffs into Alicia lungs every 4 (four) hours as needed (for shortness of breath).   . [DISCONTINUED] amLODipine (NORVASC) 2.5 MG tablet Take 2.5 mg by mouth daily.  . [DISCONTINUED] aspirin EC 81 MG tablet Take 81 mg by mouth daily.  . [DISCONTINUED] atorvastatin (LIPITOR) 20 MG tablet Take 20 mg by mouth at bedtime.   . [DISCONTINUED] Calcium Carbonate-Vitamin D (CALTRATE 600+D PO) Take 600 mg by mouth at bedtime.  . [DISCONTINUED] docusate sodium (COLACE) 100 MG capsule Take 100 mg by mouth 2 (two) times daily.  . [DISCONTINUED] Memantine HCl ER (NAMENDA XR) 21 MG CP24 Take 21 mg by mouth daily. (Alicia [name] taking differently: Take 21 mg by mouth daily. Start after taking 14 mg xr for 7 days, take 21 mg xr for 7 days, then take 28 mg xr daily until stopped)  . [DISCONTINUED] Multiple Vitamin (MULTIVITAMIN WITH MINERALS) TABS Take 1 tablet by mouth daily. Centrum Silver  . [DISCONTINUED] nitrofurantoin, macrocrystal-monohydrate, (MACROBID) 100 MG capsule Take 100 mg by mouth at bedtime. Continuous course (hold while on antibiotics)    No facility-administered encounter medications on file as of 05/06/2015.  :  Review of Systems:  Out of a complete 14 point review of systems, all are reviewed and negative with Alicia exception of these symptoms as listed below:   Review of Systems  Neurological: Positive for seizures.       Alicia [name] is in hospice care, but they gave 48hr notice for discharge.   Alicia [name] was taken off all medications in January due to bowel obstruction.   04/30/15 Alicia [name] had 2 seizures. Since then Alicia [name] reports that Alicia [name] stares off more, increased drooling, and weakness.     Objective:  Neurologic Exam  Physical Exam Physical Examination:   Filed Vitals:   05/06/15 1202  BP: 88/52  Pulse: 64  Resp: 14   Manual recheck on Alicia blood pressure at 12:50 PM was 85/66 with a pulse of 72. Alicia Alicia [name] was able to drink 2 small cups of water.  General Examination: Alicia Alicia [name] is a very pleasant 81 y.o. female in no acute distress. She is calm and cooperative with Alicia exam, but very quiet, withdrawn, frail and deconditioned appearing.   HEENT: Normocephalic, atraumatic, pupils are equal, round and reactive to light and accommodation. Extraocular tracking shows moderate saccadic breakdown without nystagmus noted. Hearing is impaired severely. Face is symmetric with mild facial masking and normal facial sensation. There is a mild intermittent lip, and lower jaw tremor. Neck is supple rigid with intact passive ROM. There are no carotid bruits on auscultation. Oropharynx exam reveals no mouth dryness. No significant airway crowding is noted. Mallampati is class II. Tongue protrudes centrally and palate elevates symmetrically. She has minimal speech. She has no drooling. Saliva appears thick.   Chest: is clear to auscultation without wheezing, rhonchi or crackles noted.  Heart: sounds are regular and normal  without murmurs, rubs or gallops noted.   Abdomen: is soft, non-tender and non-distended with  normal bowel sounds appreciated on auscultation.  Extremities: There is no pitting edema in Alicia distal lower extremities bilaterally. Pedal pulses are intact.  Skin: is warm and dry with no trophic changes noted.  Musculoskeletal: exam reveals no obvious joint deformities, tenderness or joint swelling or erythema.  Neurologically:  Mental status: Alicia Alicia [name] is awake and  not fully alert, paying very little attention. Speech is minimal. She is answering very slowly and 1 word answers. She is following commands but needs prompting and is able to mimic rather than follow just verbal cues. Speech is raspy/hoarse.     On 11/11/13: MMSE: did not do, as she has recently been ill and was not back to Alicia baseline.    04/02/2014: MMSE: 18/30, CDT was 3/4, AFT was 3/min.  On 10/30/2014: MMSE: 13/30, CDT 3/4, AFT 3/min.  Cranial nerves are as described above under HEENT exam. In addition, shoulder shrug is normal with equal shoulder height noted.  Motor exam:  Thin bulk and mild global weakness is noted. She is not able to participate much in coordination testing and sensory testing. Fine motor skills are impaired.  She's not able to walk independently. We provided a wheelchair for Alicia.   Assessment and Plan:    In summary, NITA WHITMIRE is a very pleasant 81 year old female with a history of advanced dementia without behavioral disturbance, complicated by Alicia advanced age, history of syncopal and pre-syncopal spells, low BP and low heart rate, (had implantable loop recorder in place and followed by cardiology), previous report of VH and AH and sleep walking, recurrent urinary tract infections as well as  recurrent small bowel obstructions and multiple hospitalizations for small bowel obstruction, altered mental status, recent choking event and most recently she was taken to [facility] in March for seizure-like spells. I'm not sure if she has convulsive syncope some versus true seizures. It  will be difficult to tease out. She had an EEG last year which was normal. We will repeat an EEG. We will be cautious with adding any new medications. Based on Alicia EEG results we can consider low-dose Keppra. I discussed this with Alicia Alicia [name] at length today. I spent an extended amount of time talking to Alicia Alicia [name] and Alicia complications and issues we're facing at this time. I think she would benefit from seeing a geriatric specialist and I made a referral in that regard. Alicia Alicia [name] would like to pursue this. We will call Alicia with Alicia EEG results. For Alicia advanced dementia she was tried on Aricept on more than one occasion and just could not tolerate it.  She no longer is on Namenda. For Alicia long-term care form I also recommended that Alicia Alicia [name] pursue this through primary care as Alicia Alicia [name] is facing a lot of general health complications at this time. She has been in hospice care and as I understand they are ready to discharge Alicia. Alicia Alicia [name] feels that she's not ready for discharge. Alicia best way to determine this is through Alicia hospice physician directly. I certainly do see a general decline in Alicia Alicia [name] with more frailty, deconditioning, less attention and more confusion, and a general physical decline. Thankfully, Alicia Alicia [name] has a great support system and wonderful family that helps take care of Alicia. She has one on one care, as Alicia family has hired sitters that help take care of Alicia nursing home. she is  never without supervision. I  I suggested a two-month check up with one of our nurse practitioners in a four-month follow-up with me. I answered all Alicia questions today and Alicia Alicia [name]'s Alicia [name] was in agreement. I spent 45 minutes in total face-to-face time with Alicia Alicia [name], more than 50% of which was spent in counseling and coordination of care, reviewing test results, reviewing medication and discussing or reviewing Alicia diagnosis of advanced dementia,  its prognosis and treatment options.

## 2015-05-06 NOTE — Patient Instructions (Signed)
We will do an EEG (brain wave test), and look for seizure like activity. We may consider low dose seizure medication. She may benefit from seeing a geriatric specialist. Please have your primary care physician fill out the care form.    Please remember, common seizure triggers are: Sleep deprivation, dehydration, overheating, stress, hypoglycemia or skipping meals, certain medications or excessive alcohol use, especially stopping alcohol abruptly if you have had heavy alcohol use before (aka alcohol withdrawal seizure). If you have a prolonged seizure over 2-5 minutes or back to back seizures, call or have someone call 911 or take you to the nearest emergency room. You cannot drive a car or operate any other machinery or vehicle within 6 months of a seizure. Please do not swim alone or take a tub bath for safety. Do not cook with large quantities of boiling water or oil for safety. Take your medicine for seizure prevention regularly and do not skip doses or stop medication abruptly and tone are told to do so by your healthcare provider. Try to get a refill on your antiepileptic medication ahead of time, so you are not at risk of running out. If you run out of the seizure medication and do not have a refill at hand she may run into medication withdrawal seizures. Avoid taking Wellbutrin, narcotic pain medications and tramadol, as they can lower seizure threshold.

## 2015-05-12 ENCOUNTER — Ambulatory Visit: Payer: Medicare Other | Admitting: Neurology

## 2015-05-22 ENCOUNTER — Ambulatory Visit (INDEPENDENT_AMBULATORY_CARE_PROVIDER_SITE_OTHER): Payer: Medicare Other | Admitting: Neurology

## 2015-05-22 DIAGNOSIS — K56600 Partial intestinal obstruction, unspecified as to cause: Secondary | ICD-10-CM

## 2015-05-22 DIAGNOSIS — R54 Age-related physical debility: Secondary | ICD-10-CM

## 2015-05-22 DIAGNOSIS — R55 Syncope and collapse: Secondary | ICD-10-CM | POA: Diagnosis not present

## 2015-05-22 DIAGNOSIS — F03C0 Unspecified dementia, severe, without behavioral disturbance, psychotic disturbance, mood disturbance, and anxiety: Secondary | ICD-10-CM

## 2015-05-22 DIAGNOSIS — R6889 Other general symptoms and signs: Secondary | ICD-10-CM

## 2015-05-22 DIAGNOSIS — E46 Unspecified protein-calorie malnutrition: Secondary | ICD-10-CM

## 2015-05-22 DIAGNOSIS — R5381 Other malaise: Secondary | ICD-10-CM

## 2015-05-22 DIAGNOSIS — F0390 Unspecified dementia without behavioral disturbance: Secondary | ICD-10-CM

## 2015-05-22 DIAGNOSIS — Z9289 Personal history of other medical treatment: Secondary | ICD-10-CM

## 2015-05-22 NOTE — Procedures (Signed)
     History: Alicia [name] is an 81 year old patient with a history of memory disturbance, depression, and tremor. She is being evaluated for the issues of memory loss and intermittent confusion. The patient has had episodes of near-syncope.  This is a routine EEG. No skull defects are noted. Medications include Claritin, Prilosec, Zoloft, and Pepcid.  EEG classification: Dysrhythmia grade 2 right hemisphere  Description of the recording: The background rhythm of this recording consists of a relatively well-modulated medium amplitude alpha rhythm of 9 Hz that is reactive to eye opening closure. As workup progresses, the patient appears to spend most of the recording in the drowsy state with a 7 Hz background slowing. Intermittently during the recording, there appears to be evidence of asymmetric dysrhythmic theta activity emanating from the right hemisphere. Photic stimulation and hyperventilation were not performed. There do not appear to be evidence of spike or spike-wave discharges during the recording. EKG monitor shows no evidence of cardiac rhythm abnormalities with a heart rate of 66.  Impression: This is an abnormal EEG recording secondary to asymmetric dysrhythmic theta activity emanating from the right hemisphere. This study suggests a right brain abnormality, with a possible lowered seizure threshold. No electrographic seizures were recorded during the study, however.

## 2015-05-26 ENCOUNTER — Telehealth: Payer: Self-pay

## 2015-05-26 DIAGNOSIS — G40909 Epilepsy, unspecified, not intractable, without status epilepticus: Secondary | ICD-10-CM

## 2015-05-26 MED ORDER — LEVETIRACETAM 250 MG PO TABS: ORAL_TABLET | ORAL | Status: AC

## 2015-05-26 NOTE — Telephone Encounter (Signed)
I spoke to Alicia [name] and she is aware of results and is agreeable to starting a medication for this. Please send to [facility] in [place].

## 2015-05-26 NOTE — Telephone Encounter (Signed)
I left a message on Alicia [name]'s vm with directions below. Directions have been sent with medication to correct pharmacy.

## 2015-05-26 NOTE — Telephone Encounter (Signed)
Please call [name] and advise her that I would like for Alicia [name] to start low dose Keppra 250 mg strength: Take 1 pill once daily at bedtime for 2 weeks, then if tolerated increase to 1 pill twice daily thereafter. We will have to see how she does clinically. Most common side effects include sleepiness and sometimes mood irritability. We will just have to monitor for this. For significant kidney disease we have to lower the dose but this is still a low dose to begin with.

## 2015-05-26 NOTE — Telephone Encounter (Signed)
-----   Message from Star Age, MD sent at 05/26/2015  2:21 PM EDT ----- Please call her daughter in law about her recent EEG results: Findings were abnormal suggesting asymmetric brain waves with mild slowness noted from the right side. This suggests a right brain abnormality, possibly a lowered seizure threshold and as discussed, the patient may be having smaller seizure episodes which can present with confusion and staring and loss of time as well as frank loss of consciousness. We have previously discussed the possibility of trying her on a seizure medication. We could potentially try her on a very low dose and see if she clinically improves. No electrographic seizure was seen on the EEG.  Please let me know, how she feels about it.  Star Age, MD, PhD [facility])

## 2015-05-26 NOTE — Telephone Encounter (Signed)
I left message for [name] to call back for EEG results.

## 2015-05-26 NOTE — Progress Notes (Signed)
Quick Note:  Please call her daughter in law about her recent EEG results: Findings were abnormal suggesting asymmetric brain waves with mild slowness noted from the right side. This suggests a right brain abnormality, possibly a lowered seizure threshold and as discussed, the patient may be having smaller seizure episodes which can present with confusion and staring and loss of time as well as frank loss of consciousness. We have previously discussed the possibility of trying her on a seizure medication. We could potentially try her on a very low dose and see if she clinically improves. No electrographic seizure was seen on the EEG.  Please let me know, how she feels about it.  Star Age, MD, PhD [facility])   ______

## 2015-06-11 ENCOUNTER — Ambulatory Visit: Payer: Medicare Other | Admitting: Nurse Practitioner

## 2015-07-06 ENCOUNTER — Encounter: Payer: Self-pay | Admitting: Nurse Practitioner

## 2015-07-06 ENCOUNTER — Ambulatory Visit (INDEPENDENT_AMBULATORY_CARE_PROVIDER_SITE_OTHER): Payer: Medicare Other | Admitting: Nurse Practitioner

## 2015-07-06 VITALS — BP 120/64 | HR 86 | Ht 62.0 in | Wt 124.2 lb

## 2015-07-06 DIAGNOSIS — IMO0001 Reserved for inherently not codable concepts without codable children: Secondary | ICD-10-CM

## 2015-07-06 DIAGNOSIS — R6889 Other general symptoms and signs: Secondary | ICD-10-CM | POA: Diagnosis not present

## 2015-07-06 DIAGNOSIS — R413 Other amnesia: Secondary | ICD-10-CM | POA: Diagnosis not present

## 2015-07-06 DIAGNOSIS — R55 Syncope and collapse: Secondary | ICD-10-CM | POA: Diagnosis not present

## 2015-07-06 NOTE — Patient Instructions (Signed)
Continue Keppra twice a day 12 hours apart Given copy of EEG report F/U in 2 months with Dr. Rexene Alberts

## 2015-07-06 NOTE — Progress Notes (Addendum)
[facility]  PATIENT: Alicia [name] DOB: 16-Mar-1923   REASON FOR VISIT: follow-up for Alzheimer's dementia severe, syncopal episodes versus seizure, malnutrition fraility, history of bowel obstructions HISTORY FROM:son-in-law    HISTORY OF PRESENT ILLNESS:Alicia [name] is a very pleasant 81 year old right-handed woman with an underlying medical history of cancer, tremor, depression, anxiety, hyperlipidemia, syncope, weight loss and memory loss, who presents for followup consultation of her memory loss and intermittent confusion. She is accompanied by her daughter-in-law, Alicia [name], again today. I last saw her on 10/30/2014, at which time soon reported that the patient had a presyncopal spell while watching a ball game. She never completely lost consciousness. She was found to have severe obstipation. She had had more and more problems with constipation and recurrence of bowel impaction. She was drinking fluid and had a good appetite. Memory by she was noted to have a continual decline. She was seen a GI doctor at [facility]. She was diagnosed with diverticulosis.   Unfortunately, in the interim, she was hospitalized from 10/30/2014 through 11/04/2014 for chest pain in a choking spell at home. She was brought in by EMS after she was found to begin choking on a sandwich.   Today, 05/06/2015: I reviewed the hospital records including the discharge summary from her admission in February 2016 [facility]. She was found to have another episode of partial small bowel obstruction. She was primarily treated conservatively. She was placed on a soft low fiber diet upon discharge is instructed to restart Namenda when able to take by mouth. She had an EEG in the past: in 5/15: This normal EEG is recorded in the waking and sleep state. There was no seizure or seizure predisposition recorded on this study.  She has been at [facility] in March and has been in hospice since  then, primary due to malnutrition. She is now on Ensure 3 a day, she does drink enough water. She is incontinent. She has been in hospice care since 11/22/14 and ready to be released from them. According to the daughter-in-law her albumen level has improved. I reviewed her emergency room records from [facility] from 04/30/2015. According to the daughter-in-law they recommended neurology consult but family declined and patient was sent home. I reviewed blood test results which were benign. Urinalysis showed cloudy urine with protein but no other abnormal findings. She had a CT head wo contrast on 04/30/15: 1.No acute large vascular territory ischemic changes. Although no evidence of acute infarction, mass, or hemorrhage is seen, CT is relatively insensitive for the detection of hypoxia/ischemia within the first 24-48 hours, and an MRI scan may be indicated.  2.Nonspecific white matter hypodensity, likely reflecting sequela of chronic microvascular ischemic disease (leukoaraiosis).3.Right mastoid effusion.   [name] feels that she is continuing to decline. She seems confused at times and has staring spells. She had 2 seizure-like spells last week. Both occurred at home. She was found to have tightening of her muscles and twitching. She does have a prior history of fainting spells. UPDATE 07/06/15 Patient returns for follow-up after her last visit with Dr. Rexene Alberts 8/24/ 2016. At that time she was continuing to have syncopal/seizure Spells. EEG done 09/09/2016and findings consistent with abnormal asymmetric brain waves and mild  Slowness noted from the right side. This suggests the right brain abnormality possibly a lower seizure threshold and these may be seizure episodes which can present with confusion and staring spells. She was begun on low-dose Keppra and was supposed to titrate  to 250 twice daily however she is only taking 1 tablet daily. The son-in-law was made aware that this is  not a 24-hour medication and needs to be taken twice daily.She has a caregiver Monday through Thursday with variable hours. This allows the daughter to have some respite. She has been off of her Namenda and Aricept for some time having side effects to the medication.She has had a slow progressive decline of her memory. She returns for reevaluation     REVIEW OF SYSTEMS: Full 14 system review of systems performed and notable only for those listed, all others are neg:  Constitutional: neg  Cardiovascular: neg Ear/Nose/Throat: neg  Skin: neg Eyes: neg Respiratory: neg Gastroitestinal: bowel incontinence  Hematology/Lymphatic: neg  Endocrine: neg Musculoskeletal:neg Allergy/Immunology: frequent infections Neurological: memory loss, seizure Psychiatric: confusion Sleep : neg   ALLERGIES: Allergies  Allergen Reactions  . Iohexol Rash  . Ativan [Lorazepam] Other (See Comments)    Causes agitation  . Ciprofloxacin Other (See Comments)    Weakness (possible reaction)  . Keflex [Cephalexin] Other (See Comments)    Weakness (possible reaction)  . Nsaids Other (See Comments)    Renal insufficiency  . Sulfa Antibiotics Hives and Itching  . Ivp Dye [Iodinated Diagnostic Agents] Rash    HOME MEDICATIONS: Outpatient Prescriptions Prior to Visit  Medication Sig Dispense Refill  . Lactose POWD by Does not apply route.    . levETIRAcetam (KEPPRA) 250 MG tablet Take 1 pill each night for 2 weeks, then 1 pill twice daily thereafter. (Patient taking differently: Taking one tablet daily.) 60 tablet 3  . loratadine (CLARITIN) 10 MG tablet Take 10 mg by mouth daily.     Marland Kitchen omeprazole (PRILOSEC) 20 MG capsule Take 20 mg by mouth daily.    . sertraline (ZOLOFT) 50 MG tablet Take 50 mg by mouth daily.     No facility-administered medications prior to visit.    PAST MEDICAL HISTORY: Past Medical History  Diagnosis Date  . Dementia     significant  . Renal disease   . Memory loss 12/10/2012    . Depression 12/10/2012  . Anxiety state, unspecified 12/10/2012  . Spells ([place]) 12/10/2012  . Essential hypertension, benign 12/10/2012  . Other and unspecified hyperlipidemia 12/10/2012  . Loss of weight 12/10/2012  . Carotid artery stenosis   . Ovarian cancer ([place])     treated with surgery & chemo  . Ankle fracture, right   . Unstable gait     mild  . Syncope 12/10/2012    Recurrent, sporadic episodes  . Carotid artery disease ([place])   . Parkinson disease ([place])   . Diabetes mellitus without complication ([place])   . Diverticulosis     PAST SURGICAL HISTORY: Past Surgical History  Procedure Laterality Date  . Vesicovaginal fistula closure w/ tah  2005  . Oophorectomy  2007    for ovarian cancer  . Prolapsed uterine fibroid ligation    . Appendectomy    . Cataract extraction Bilateral   . Implantable loop recorder  11/14    MDT LINQ implanted for recurrent unexplained syncope by Dr Rayann Heman  . Abdominal hysterectomy    . Ankle surgery Left   . Loop recorder implant N/A 07/17/2013    Procedure: LOOP RECORDER IMPLANT;  Surgeon: Coralyn Mark, MD;  Location: [facility] CATH [facility];  Service: Cardiovascular;  Laterality: N/A;    FAMILY HISTORY: Family History  Problem Relation Age of Onset  . Cancer Mother     breast  .  Stroke Father     SOCIAL HISTORY: Social History   Social History  . Marital Status: Married    Spouse Name: N/A  . Number of Children: N/A  . Years of Education: N/A   Occupational History  . Not on file.   Social History Main Topics  . Smoking status: Never Smoker   . Smokeless tobacco: Never Used  . Alcohol Use: No  . Drug Use: No  . Sexual Activity: No   Other Topics Concern  . Not on file   Social History Narrative   Lives with son and daughter in law     PHYSICAL EXAM  Filed Vitals:   07/06/15 1130  BP: 120/64  Pulse: 86  Height: 5\' 2"  (1.575 m)  Weight: 124 lb 3.2 oz (56.337 kg)   Body mass index is 22.71 kg/(m^2).  Generalized: Well  developed, in no acute distress , cooperative with exam, withdrawn, frail in appearance Head: normocephalic and atraumatic,. Oropharynx benign  Neck: Supple, no carotid bruits  Cardiac: Regular rate rhythm, no murmur  Musculoskeletal: No deformity   Neurological examination   Mentation: Alert , MMSE 11/30. AFT 2.   Follows some  Commands. Little speech.   Cranial nerve II-XII: Fundoscopic exam reveals sharp disc margins.Pupils were equal round reactive to light extraocular movements were full, visual field were full on confrontational test. Facial sensation and strength were normal. hearing was intact to finger rubbing bilaterally. Uvula tongue midline. head turning and shoulder shrug were normal and symmetric.Tongue protrusion into cheek strength was normal. Motor:thin bulk ,generalized weakness she is not able to participate in coordination or sensory testing. Fine motor skills are impaired. She is unable to walk independently Gait and Station: unable to walk independently DIAGNOSTIC DATA (LABS, IMAGING, TESTING) - I reviewed patient records, labs, notes, testing and imaging myself where available.  Lab Results  Component Value Date   WBC 4.5 11/04/2014   HGB 10.7* 11/04/2014   HCT 33.5* 11/04/2014   MCV 88.9 11/04/2014   PLT 205 11/04/2014      Component Value Date/Time   NA 141 11/04/2014 0542   K 3.7 11/04/2014 0542   CL 110 11/04/2014 0542   CO2 24 11/04/2014 0542   GLUCOSE 105* 11/04/2014 0542   BUN 6 11/04/2014 0542   CREATININE 0.93 11/04/2014 0542   CALCIUM 8.5 11/04/2014 0542   PROT 5.2* 11/04/2014 0542   ALBUMIN 2.9* 11/04/2014 0542   AST 41* 11/04/2014 0542   ALT 37* 11/04/2014 0542   ALKPHOS 61 11/04/2014 0542   BILITOT 0.4 11/04/2014 0542   GFRNONAA 53* 11/04/2014 0542   GFRAA 61* 11/04/2014 0542     ASSESSMENT AND PLAN  81 y.o. year old female  has a past medical history of advancedDementia; Renal disease; Memory loss (12/10/2012); Depression (12/10/2012);  Anxiety state, unspecified (12/10/2012); Spells ([place]) (12/10/2012); Essential hypertension, benign (12/10/2012); Other and unspecified hyperlipidemia (12/10/2012); Loss of weight (12/10/2012); Carotid artery stenosis; Ovarian cancer ([place]); Ankle fracture, right; Unstable gait; Syncope (12/10/2012); Carotid artery disease ([place]); Parkinson disease ([place]); Diabetes mellitus without complication ([place]); and Diverticulosis. here to follow up  Continue Keppra twice a day 12 hours apart Given copy of EEG report The patient has a great support system and wonderful family that helps take care of her. She has one on one care, as her family has hired sitters that help take care of her, she is never without supervision.  I spent additional 15 min in total face to face time with the son  in law more than 50% of which was spent counseling and coordination of care, reviewing test results reviewing medications and discussing and reviewing the diagnosis of seizure disorder and use of Keppra.  and further treatment options. Also discussed abnormal EEG, F/U in 2 months with Dr. Erasmo Score time [place], [facility], [facility], [facility] [address] [place], [place] [address] [phone]  I reviewed the above note and documentation by the Nurse Practitioner and agree with the history, physical exam, assessment and plan as outlined above. I was immediately available for face-to-face consultation. Star Age, MD, PhD [facility])

## 2015-07-21 ENCOUNTER — Telehealth: Payer: Self-pay | Admitting: Neurology

## 2015-07-21 NOTE — Telephone Encounter (Signed)
Spoke to Gregory, daughter-in-law on 50.  Reports patient was being restarted on Zoloft after being off the medication for one month.  Prior to this reintroduction, the patient had been on Zoloft for ten or more years without incident.  Says she is unsure what the hospital checked but she was agreeable to making an appointment with her PCP to make sure all other potential medical reasons could be evaluated and treated, if necessary. She did not want to discontinue Zoloft and stated she would leave her on it until seen by her PCP.

## 2015-07-21 NOTE — Telephone Encounter (Signed)
Pls ask daughter in law to stop the Zoloft, although one cannot be fully certain that it was the cause, but since it was the only new recent med. Please have her FU with PCP as well, unless at the hospital they checked any medical reasons for having back to back seizures, such as dehydration, UTI, electrolyte disturbance, respiratory infection, etc.

## 2015-07-21 NOTE — Telephone Encounter (Signed)
Pt's daughter in law called and states that her mother ( the Pt) had 2 seizures yesterday, she states that the seizure lasted 19 min. Pt loss complete control of her bladder and bowels. She did not "come to" until the EMS arrived. Daughter states that she was unable to get a B/P on her. She states the EMS was also unable to get B/P either. Daughter also states that she was clammy to touch and was very pale and cold.  EMS was called and was taken to [facility]. Daughter states that the only new medication is sertraline (ZOLOFT) 50 MG tablet that was started about 2 weeks ago. She states that 3 weeks ago she had an episode of syncope. She did not pass out completely due quick action by her and the staff. Pt was sent home , pt lives with Son and Daughter in [place]. Please call and advise [phone]

## 2015-08-13 DEATH — deceased

## 2015-08-27 ENCOUNTER — Ambulatory Visit: Payer: Medicare Other | Admitting: Neurology

## 2015-11-11 NOTE — Telephone Encounter (Signed)
error 

## 2015-12-26 IMAGING — DX DG CHEST 1V PORT
1 series · 1 of 1 positions shown · non-contrast
Comparison: 12/18/2013

CLINICAL DATA: Short of breath.

EXAM:
PORTABLE CHEST - 1 VIEW

[portable]
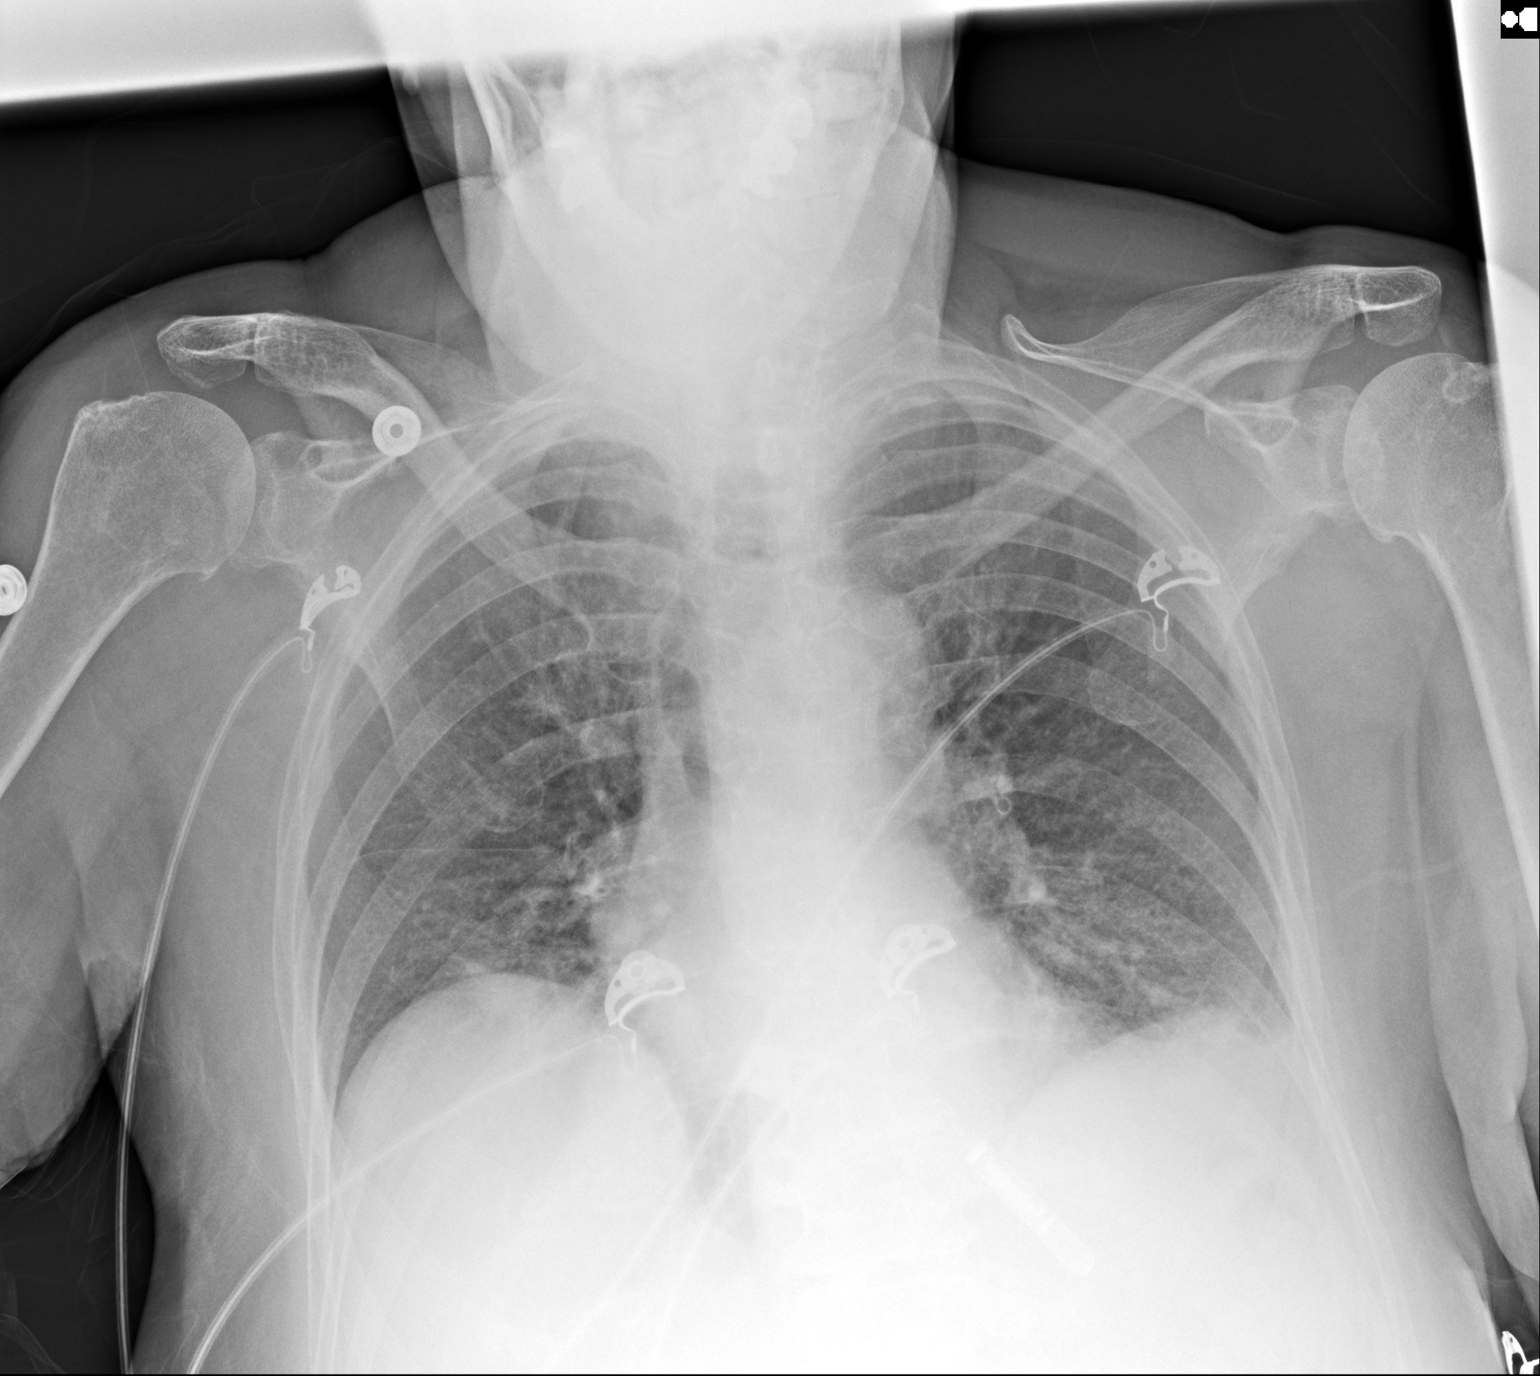

[1 of 1 positions shown; findings below may reference images not displayed]

FINDINGS: Left base are opacity is seen in the prior study has improved. There
are prominent interstitial and vascular markings at the lung bases,
left greater than right, accentuated by low lung volumes. Probable
additional basilar atelectasis. No convincing acute infiltrate and
no pulmonary edema. No pleural effusion or pneumothorax.

Cardiac silhouette is normal in size. Normal mediastinal and hilar
contours.
IMPRESSION: No convincing acute cardiopulmonary disease. Lung base opacity seen
on the prior study has improved.

## 2015-12-26 IMAGING — CT CT CERVICAL SPINE W/O CM
3 of 6 series · 11 of 33 positions shown, 13 images · non-contrast
Comparison: 12/25/2013 brain MR. 05/20/2012 head and cervical spine
CT.

CLINICAL DATA: 89-year-old female with altered mental status and
near syncope. Neck pain.

EXAM:
CT HEAD WITHOUT CONTRAST
CT CERVICAL SPINE WITHOUT CONTRAST
TECHNIQUE: Multidetector CT imaging of the head and cervical spine was
performed following the standard protocol without intravenous
contrast. Multiplanar CT image reconstructions of the cervical spine
were also generated.

[Series 307: axials · axial · 0.28mm/px · z∈[+102,+182]mm · 3 of 88 slices shown, 4 images]
[im 22/88  soft-tissue]
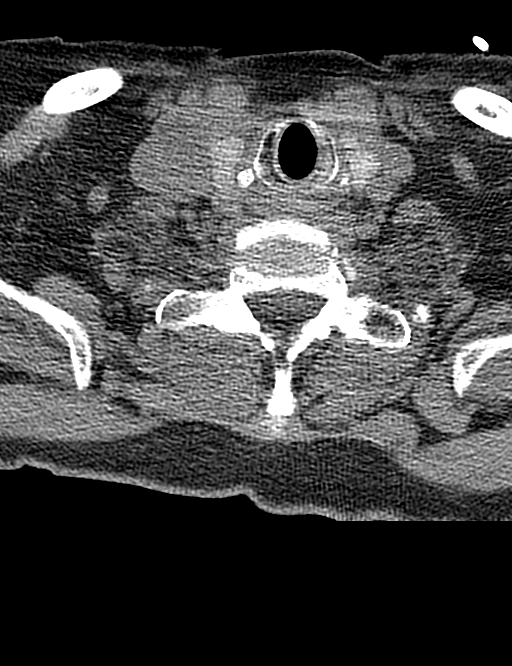
[im 22/88  bone]
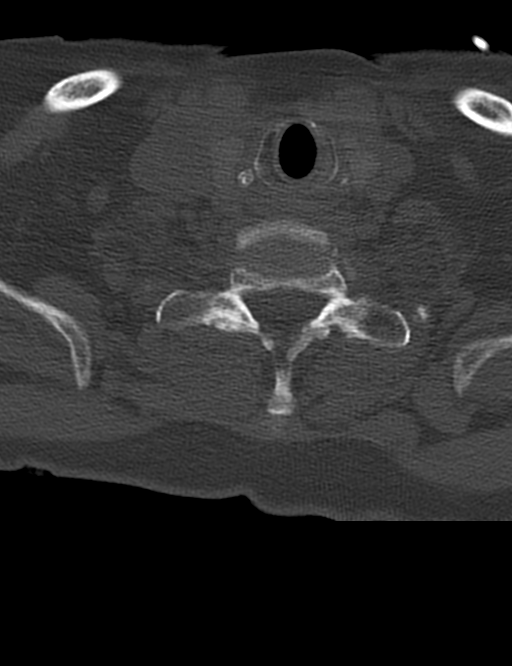
[im 44/88  bone]
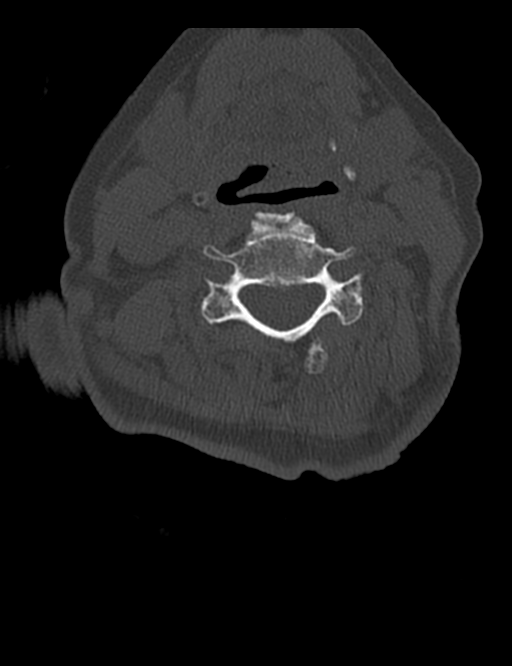
[im 66/88  bone]
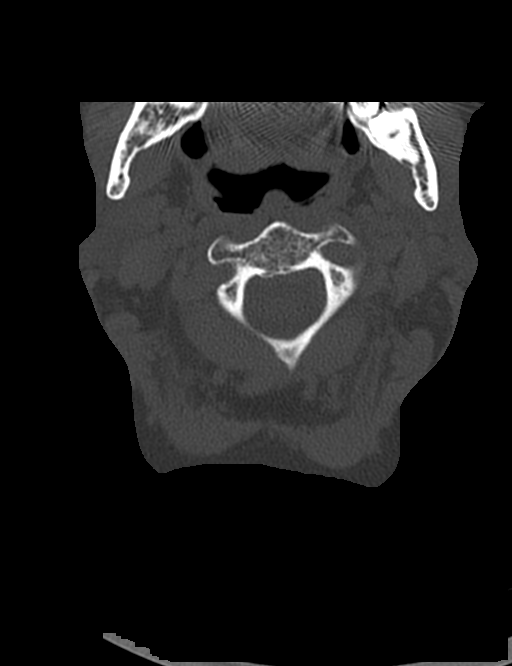

[Series 308: coronal · coronal · 0.28mm/px · 3 of 34 slices shown]
[im 7/34  bone]
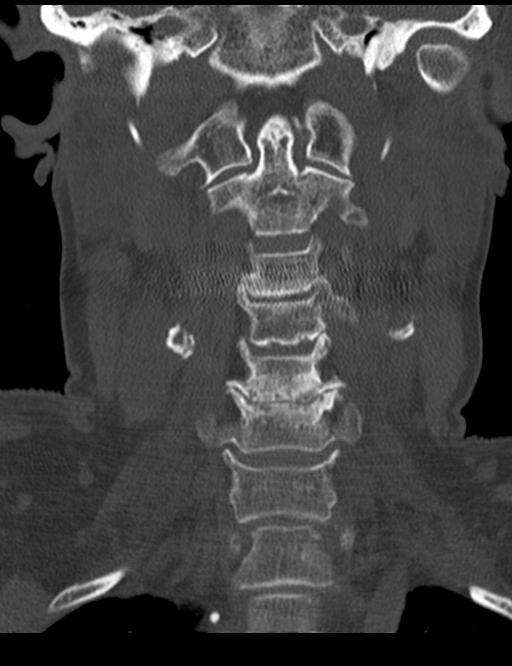
[im 14/34  bone]
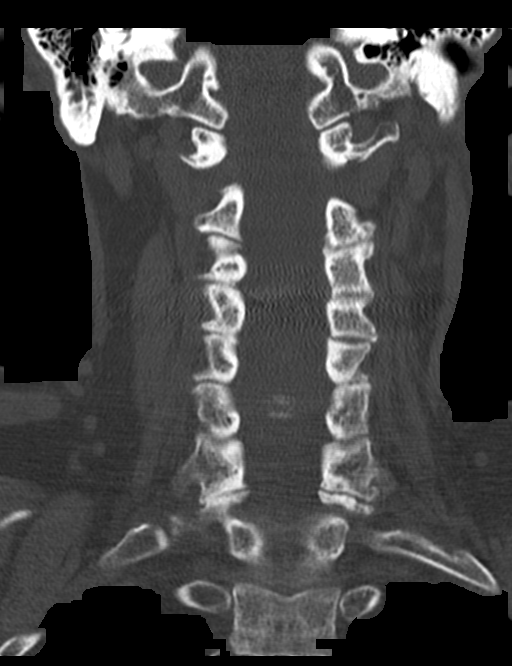
[im 20/34  bone]
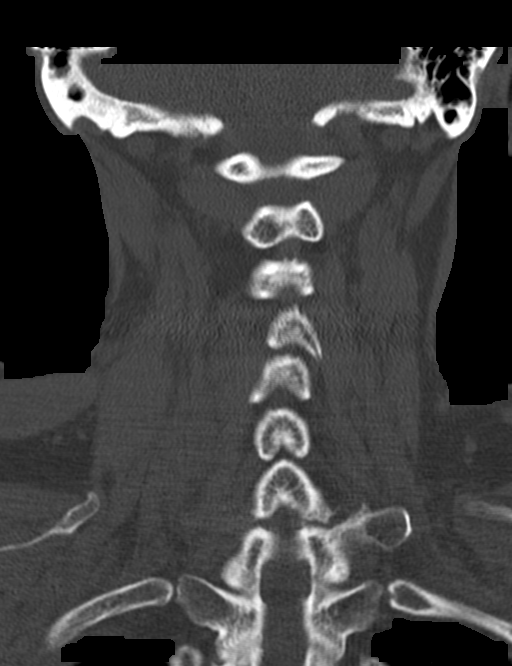

[Series 309: sagittal · sagittal · 0.28mm/px · 5 of 38 slices shown, 6 images]
[im 13/38  bone]
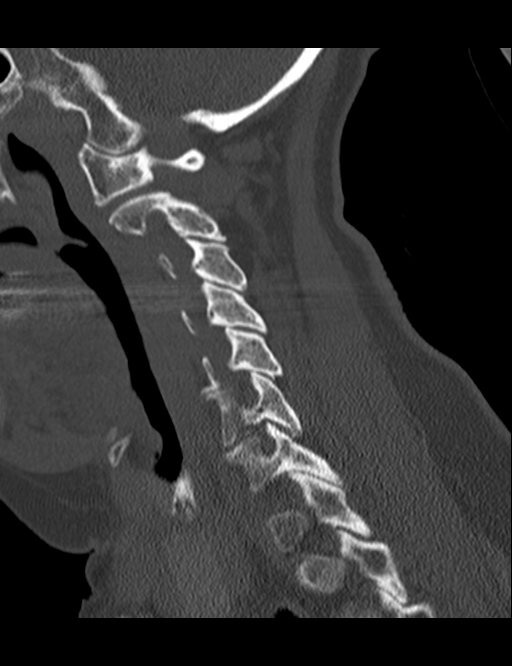
[im 16/38  bone]
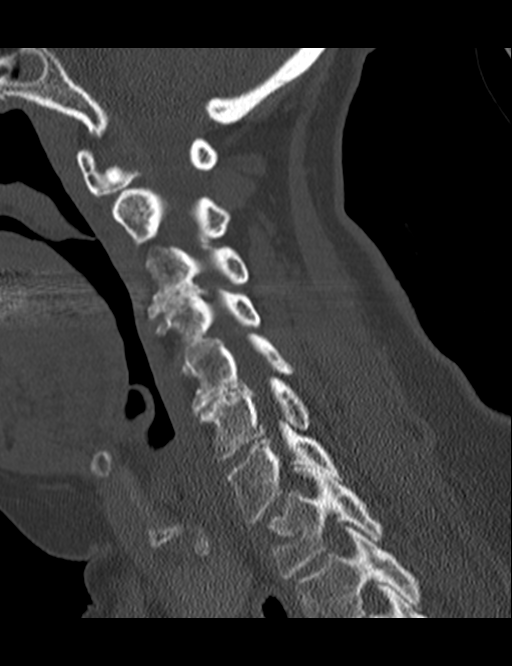
[im 19/38  soft-tissue]
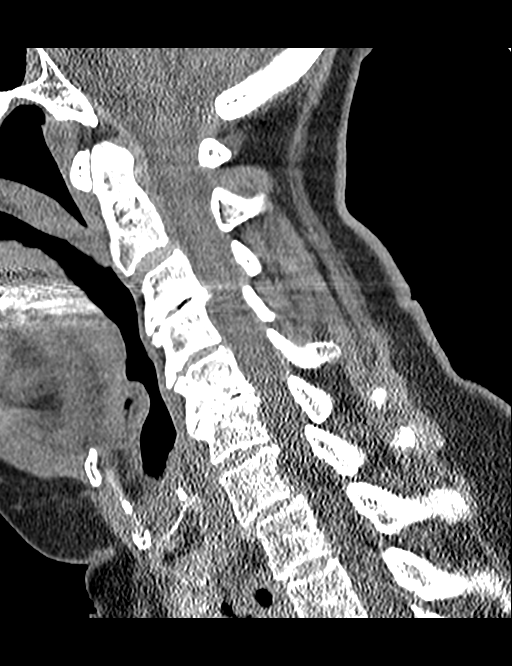
[im 19/38  bone]
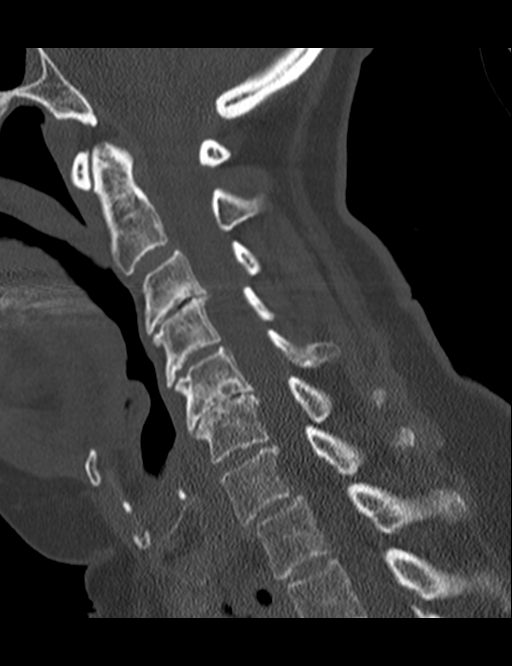
[im 22/38  bone]
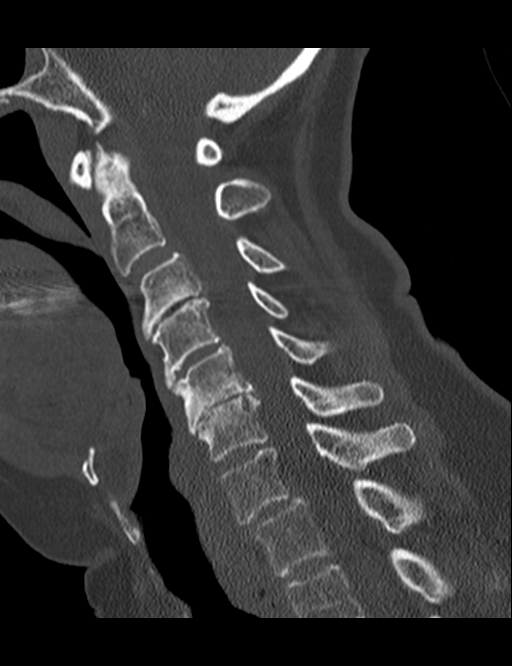
[im 25/38  bone]
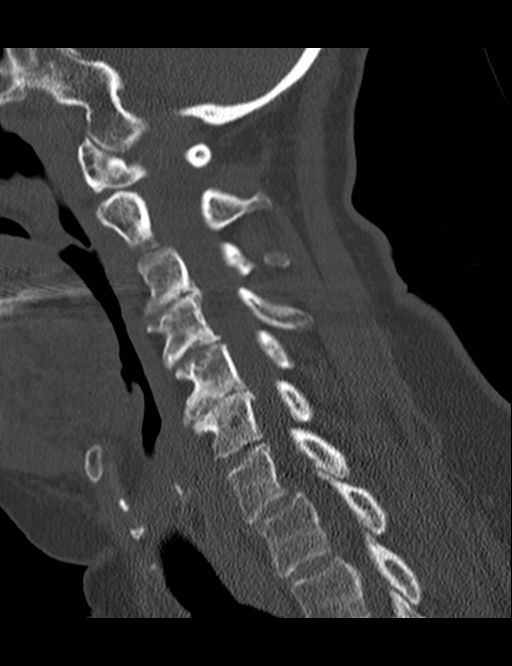

[11 of 33 positions shown; findings below may reference images not displayed]

FINDINGS: CT HEAD FINDINGS

Atrophy and chronic small-vessel white matter ischemic changes are
again noted.

No acute intracranial abnormalities are identified, including mass
lesion or mass effect, hydrocephalus, extra-axial fluid collection,
midline shift, hemorrhage, or acute infarction. The visualized bony
calvarium is unremarkable.

A small amount of fluid in the sphenoid sinuses again noted.

CT CERVICAL SPINE FINDINGS

Normal alignment again noted.

There is no evidence of acute fracture, subluxation or prevertebral
soft tissue swelling.

Moderate to severe degenerative disc disease from C3-C6 again noted.

No focal bony lesions are identified.

The soft tissue structures are unremarkable.
IMPRESSION: No evidence of acute intracranial or acute cervical spine
abnormality.

Atrophy and chronic small-vessel white matter ischemic changes.

Moderate to severe degenerative disc disease from C3-C6.

## 2016-07-23 IMAGING — CR DG CHEST 1V PORT
1 series · 1 of 1 positions shown · non-contrast
Comparison: Chest radiograph performed 06/15/2014

CLINICAL DATA: Acute onset of syncope. Confusion. Initial
encounter.

EXAM:
PORTABLE CHEST - 1 VIEW

[view not recorded]
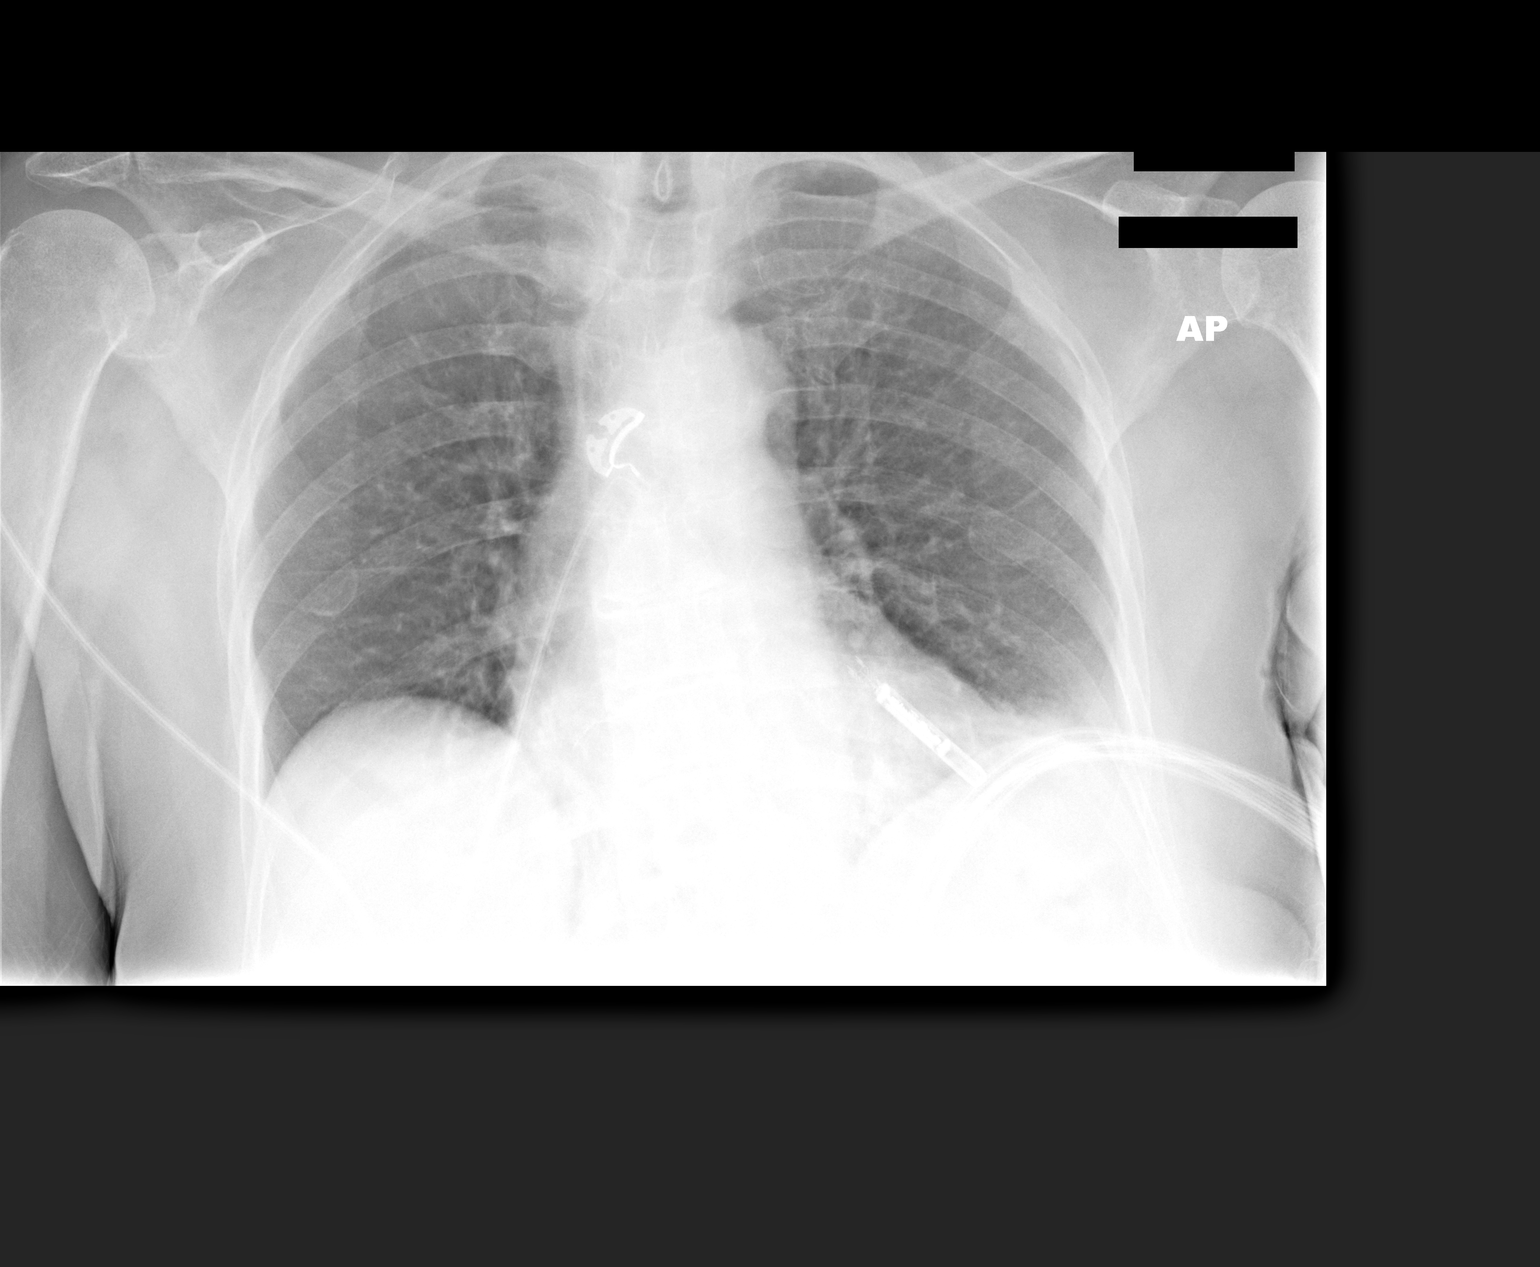

[1 of 1 positions shown; findings below may reference images not displayed]

FINDINGS: The lungs are mildly hypoexpanded. Mild left basilar airspace
opacity likely reflects atelectasis. There is no evidence of pleural
effusion or pneumothorax.

The cardiomediastinal silhouette is borderline normal in size. No
acute osseous abnormalities are seen.
IMPRESSION: Lungs mildly hypoexpanded. Mild left basilar airspace opacity likely
reflects atelectasis.
